# Patient Record
Sex: Female | Born: 1950
Health system: Southern US, Community
[De-identification: ages and names within clinical notes are randomized; demographics above are authoritative.]

## PROBLEM LIST (undated history)

## (undated) DIAGNOSIS — F429 Obsessive-compulsive disorder, unspecified: Secondary | ICD-10-CM

## (undated) DIAGNOSIS — E785 Hyperlipidemia, unspecified: Secondary | ICD-10-CM

## (undated) DIAGNOSIS — E538 Deficiency of other specified B group vitamins: Secondary | ICD-10-CM

## (undated) DIAGNOSIS — K219 Gastro-esophageal reflux disease without esophagitis: Secondary | ICD-10-CM

## (undated) DIAGNOSIS — N189 Chronic kidney disease, unspecified: Secondary | ICD-10-CM

## (undated) DIAGNOSIS — I10 Essential (primary) hypertension: Secondary | ICD-10-CM

## (undated) DIAGNOSIS — N3010 Interstitial cystitis (chronic) without hematuria: Secondary | ICD-10-CM

## (undated) DIAGNOSIS — F341 Dysthymic disorder: Secondary | ICD-10-CM

## (undated) DIAGNOSIS — N841 Polyp of cervix uteri: Secondary | ICD-10-CM

## (undated) DIAGNOSIS — R7309 Other abnormal glucose: Secondary | ICD-10-CM

## (undated) HISTORY — DX: Hyperlipidemia, unspecified: E78.5

## (undated) HISTORY — DX: Other abnormal glucose: R73.09

## (undated) HISTORY — DX: Deficiency of other specified B group vitamins: E53.8

## (undated) HISTORY — DX: Gastro-esophageal reflux disease without esophagitis: K21.9

## (undated) HISTORY — DX: Dysthymic disorder: F34.1

## (undated) HISTORY — DX: Chronic kidney disease, unspecified: N18.9

## (undated) HISTORY — PX: LASER ABLATION CONDYLOMA CERVICAL / VULVAR: SUR819

## (undated) HISTORY — DX: Essential (primary) hypertension: I10

## (undated) HISTORY — DX: Obsessive-compulsive disorder, unspecified: F42.9

## (undated) HISTORY — DX: Polyp of cervix uteri: N84.1

---

## 2012-07-03 DIAGNOSIS — R7303 Prediabetes: Secondary | ICD-10-CM | POA: Insufficient documentation

## 2012-07-03 DIAGNOSIS — E785 Hyperlipidemia, unspecified: Secondary | ICD-10-CM | POA: Insufficient documentation

## 2013-01-23 ENCOUNTER — Ambulatory Visit: Payer: Self-pay | Admitting: Family Medicine

## 2013-08-05 ENCOUNTER — Encounter: Payer: Self-pay | Admitting: *Deleted

## 2013-08-12 ENCOUNTER — Encounter: Payer: Self-pay | Admitting: Cardiovascular Disease

## 2013-08-12 ENCOUNTER — Ambulatory Visit (INDEPENDENT_AMBULATORY_CARE_PROVIDER_SITE_OTHER): Payer: Medicare Other | Admitting: Cardiovascular Disease

## 2013-08-12 ENCOUNTER — Encounter (INDEPENDENT_AMBULATORY_CARE_PROVIDER_SITE_OTHER): Payer: Self-pay

## 2013-08-12 VITALS — BP 151/84 | HR 63 | Ht 66.5 in | Wt 227.5 lb

## 2013-08-12 DIAGNOSIS — R0989 Other specified symptoms and signs involving the circulatory and respiratory systems: Secondary | ICD-10-CM

## 2013-08-12 DIAGNOSIS — I10 Essential (primary) hypertension: Secondary | ICD-10-CM | POA: Insufficient documentation

## 2013-08-12 DIAGNOSIS — R0609 Other forms of dyspnea: Secondary | ICD-10-CM

## 2013-08-12 DIAGNOSIS — R079 Chest pain, unspecified: Secondary | ICD-10-CM

## 2013-08-12 DIAGNOSIS — R0600 Dyspnea, unspecified: Secondary | ICD-10-CM | POA: Insufficient documentation

## 2013-08-12 NOTE — Patient Instructions (Addendum)
Your physician has requested that you have an echocardiogram. Echocardiography is a painless test that uses sound waves to create images of your heart. It provides your doctor with information about the size and shape of your heart and how well your heart's chambers and valves are working. This procedure takes approximately one hour. There are no restrictions for this procedure.  [place]  Your caregiver has ordered a Stress Test with nuclear imaging. The purpose of this test is to evaluate the blood supply to your heart muscle. This procedure is referred to as a "Non-Invasive Stress Test." This is because other than having an IV started in your vein, nothing is inserted or "invades" your body. Cardiac stress tests are done to find areas of poor blood flow to the heart by determining the extent of coronary artery disease (CAD). Some patients exercise on a treadmill, which naturally increases the blood flow to your heart, while others who are  unable to walk on a treadmill due to physical limitations have a pharmacologic/chemical stress agent called Lexiscan . This medicine will mimic walking on a treadmill by temporarily increasing your coronary blood flow.   Please note: these test may take anywhere between 2-4 hours to complete  PLEASE REPORT TO [facility] AT THE FIRST DESK WILL DIRECT YOU WHERE TO GO  Date of Procedure:_____________5/5/15________________________  Arrival Time for Procedure:__________0945 AM____________________   PLEASE NOTIFY THE OFFICE AT LEAST 24 HOURS IN ADVANCE IF YOU ARE UNABLE TO KEEP YOUR APPOINTMENT.  [phone] AND  PLEASE NOTIFY NUCLEAR MEDICINE AT [facility] AT LEAST 24 HOURS IN ADVANCE IF YOU ARE UNABLE TO KEEP YOUR APPOINTMENT. [phone]  How to prepare for your Myoview test:  1. Do not eat or drink after midnight 2. No caffeine for 24 hours prior to test 3. No smoking 24 hours prior to test. 4. Your medication may be taken  with water.  If your doctor stopped a medication because of this test, do not take that medication. 5. Ladies, please do not wear dresses.  Skirts or pants are appropriate. Please wear a short sleeve shirt. 6. No perfume, cologne or lotion. 7. Wear comfortable walking shoes. No heels!  Your physician recommends that you schedule a follow-up appointment in:  After your tests

## 2013-08-12 NOTE — Progress Notes (Signed)
Primary care physician: Dr. Margret Chance  HPI  This is a 63 year old Caucasian female who was referred for evaluation of chest pain and dyspnea. She has no previous cardiac history. She has chronic medical conditions that include hypertension, obesity, hyperlipidemia and borderline diabetes. She suffers from severe anxiety and depression. She reports chest pain that started about one year ago described as right-sided and substernal aching sensation worse with certain movements. She had a mammogram and chest x-ray done which were unremarkable. She was diagnosed with musculoskeletal pain. Symptoms partially improved with medications. However, over the last few weeks he has experienced excessive fatigue and dyspnea with minimal activities. No orthopnea or PND. She does not exercise on a regular basis. She is a lifelong nonsmoker and denies alcohol use. There is family history of coronary artery disease but not prematurely.  Not on File   Current Outpatient Prescriptions on File Prior to Visit  Medication Sig Dispense Refill  . ALPRAZolam (XANAX PO) Take 1 mg by mouth as needed.       . bisoprolol-hydrochlorothiazide (ZIAC) 10-6.25 MG per tablet Take 1 tablet by mouth daily.      . clorazepate (TRANXENE) 7.5 MG tablet Take 7.5 mg by mouth at bedtime.       Marland Kitchen NABUMETONE PO Take 750 mg by mouth 2 (two) times daily.      . Omega-3 Fatty Acids (FISH OIL PO) Take 2 g by mouth daily.      . ranitidine (ZANTAC) 150 MG capsule Take 150 mg by mouth 2 (two) times daily.      . silver sulfADIAZINE (SILVADENE) 1 % cream Apply 1 application topically as needed.       . venlafaxine (EFFEXOR) 75 MG tablet Take 75 mg by mouth 2 (two) times daily.       No current facility-administered medications on file prior to visit.     Past Medical History  Diagnosis Date  . Dysthymic disorder   . Obsessive-compulsive disorders   . Esophageal reflux   . Other B-complex deficiencies   . Other abnormal glucose     . Hyperlipidemia   . Other abnormal glucose   . Cervical polyp   . Hypertension      Past Surgical History  Procedure Laterality Date  . Laser ablation condyloma cervical / vulvar       Family History  Problem Relation Age of Onset  . Heart failure Mother   . Aortic stenosis Mother   . Hypertension Mother   . Heart attack Father   . Hypertension Father   . Heart attack Paternal Grandfather      History   Social History  . Marital Status: Single    Spouse Name: N/A    Number of Children: N/A  . Years of Education: N/A   Occupational History  . Not on file.   Social History Main Topics  . Smoking status: Never Smoker   . Smokeless tobacco: Not on file  . Alcohol Use: No  . Drug Use: No  . Sexual Activity: Not on file   Other Topics Concern  . Not on file   Social History Narrative  . No narrative on file     ROS A 10 point review of system was performed. It is negative other than that mentioned in the history of present illness.   PHYSICAL EXAM   BP 151/84  Pulse 63  Ht 5' 6.5" (1.689 m)  Wt 227 lb 8 oz (103.193 kg)  BMI 36.17  kg/m2 Constitutional: She is oriented to person, place, and time. She appears well-developed and well-nourished. No distress.  HENT: No nasal discharge.  Head: Normocephalic and atraumatic.  Eyes: Pupils are equal and round. No discharge.  Neck: Normal range of motion. Neck supple. No JVD present. No thyromegaly present.  Cardiovascular: Normal rate, regular rhythm, normal heart sounds. Exam reveals no gallop and no friction rub. No murmur heard.  Pulmonary/Chest: Effort normal and breath sounds normal. No stridor. No respiratory distress. She has no wheezes. She has no rales. She exhibits no tenderness.  Abdominal: Soft. Bowel sounds are normal. She exhibits no distension. There is no tenderness. There is no rebound and no guarding.  Musculoskeletal: Normal range of motion. She exhibits no edema and no tenderness.   Neurological: She is alert and oriented to person, place, and time. Coordination normal.  Skin: Skin is warm and dry. No rash noted. She is not diaphoretic. No erythema. No pallor.  Psychiatric: She has a normal mood and affect. Her behavior is normal. Judgment and thought content normal.     EKG: Normal sinus rhythm with no significant ST or changes.   ASSESSMENT AND PLAN

## 2013-08-12 NOTE — Assessment & Plan Note (Signed)
Blood pressure is elevated today. Continue to monitor. I will have her followup with me after cardiac testing.

## 2013-08-12 NOTE — Assessment & Plan Note (Signed)
This is likely multifactorial with a component of physical deconditioning. She does have known history of hypertension. I requested an echocardiogram to evaluate LV systolic/diastolic function and pulmonary pressure.

## 2013-08-12 NOTE — Assessment & Plan Note (Signed)
I agree that the chest pain is overall atypical. However, this has persisted and recently has been associated with dyspnea with minimal activities. Given her risk factors for coronary artery disease, I recommend evaluation with a pharmacologic nuclear stress test. She is not able to exercise on a treadmill.

## 2013-08-18 ENCOUNTER — Telehealth: Payer: Self-pay

## 2013-08-18 DIAGNOSIS — R079 Chest pain, unspecified: Secondary | ICD-10-CM

## 2013-08-18 NOTE — Telephone Encounter (Signed)
That's fine

## 2013-08-18 NOTE — Telephone Encounter (Signed)
Pt would like to try treadmill myo instead of lexi.  She stated she feels her anxiety is to bad for the Lexi.

## 2013-08-18 NOTE — Telephone Encounter (Signed)
Informed patient that we switched her to a Treadmill Myoview  Patient verbalized understanding   Tammy at [facility] confirmed change

## 2013-08-18 NOTE — Telephone Encounter (Signed)
Pt called and has a question about her stress test

## 2013-08-19 ENCOUNTER — Ambulatory Visit: Payer: Self-pay | Admitting: Cardiovascular Disease

## 2013-08-19 DIAGNOSIS — R079 Chest pain, unspecified: Secondary | ICD-10-CM

## 2013-08-20 ENCOUNTER — Other Ambulatory Visit: Payer: Self-pay

## 2013-08-20 DIAGNOSIS — R079 Chest pain, unspecified: Secondary | ICD-10-CM

## 2013-08-21 ENCOUNTER — Other Ambulatory Visit: Payer: Medicare Other

## 2013-08-21 ENCOUNTER — Other Ambulatory Visit (INDEPENDENT_AMBULATORY_CARE_PROVIDER_SITE_OTHER): Payer: Medicare Other

## 2013-08-21 ENCOUNTER — Other Ambulatory Visit: Payer: Self-pay

## 2013-08-21 DIAGNOSIS — R0602 Shortness of breath: Secondary | ICD-10-CM

## 2013-08-21 DIAGNOSIS — R079 Chest pain, unspecified: Secondary | ICD-10-CM

## 2013-08-21 DIAGNOSIS — R0600 Dyspnea, unspecified: Secondary | ICD-10-CM

## 2013-08-26 ENCOUNTER — Encounter: Payer: Self-pay | Admitting: Cardiovascular Disease

## 2013-08-26 ENCOUNTER — Ambulatory Visit (INDEPENDENT_AMBULATORY_CARE_PROVIDER_SITE_OTHER): Payer: Medicare Other | Admitting: Cardiovascular Disease

## 2013-08-26 VITALS — BP 141/84 | HR 61 | Ht 66.0 in | Wt 229.8 lb

## 2013-08-26 DIAGNOSIS — R0600 Dyspnea, unspecified: Secondary | ICD-10-CM

## 2013-08-26 DIAGNOSIS — R0609 Other forms of dyspnea: Secondary | ICD-10-CM

## 2013-08-26 DIAGNOSIS — R079 Chest pain, unspecified: Secondary | ICD-10-CM

## 2013-08-26 DIAGNOSIS — R0989 Other specified symptoms and signs involving the circulatory and respiratory systems: Secondary | ICD-10-CM

## 2013-08-26 NOTE — Progress Notes (Signed)
Primary care physician: Dr. Margret Chance  HPI  This is a 63 year old Caucasian female who is here today for followup visit regarding chest pain and dyspnea. She has no previous cardiac history. She has chronic medical conditions that include hypertension, obesity, hyperlipidemia and borderline diabetes. She suffers from severe anxiety and depression. She was seen recently for atypical chest pain and exertional dyspnea. She underwent a pharmacologic nuclear stress test which showed no evidence of ischemia. Echocardiogram showed normal LV systolic function with only mild mitral regurgitation and no evidence of pulmonary hypertension.   No Known Allergies   Current Outpatient Prescriptions on File Prior to Visit  Medication Sig Dispense Refill  . ALPRAZolam (XANAX PO) Take 1 mg by mouth as needed.       Marland Kitchen b complex vitamins tablet Take 1 tablet by mouth daily.      . bisoprolol-hydrochlorothiazide (ZIAC) 10-6.25 MG per tablet Take 1 tablet by mouth daily.      . Cholecalciferol (VITAMIN D3) 2000 UNITS capsule Take 2,000 Units by mouth daily.      . clorazepate (TRANXENE) 7.5 MG tablet Take 7.5 mg by mouth at bedtime.       Marland Kitchen NABUMETONE PO Take 750 mg by mouth 2 (two) times daily.      . ranitidine (ZANTAC) 150 MG capsule Take 150 mg by mouth 2 (two) times daily.      . silver sulfADIAZINE (SILVADENE) 1 % cream Apply 1 application topically as needed.       . venlafaxine (EFFEXOR) 75 MG tablet Take 75 mg by mouth 2 (two) times daily.       No current facility-administered medications on file prior to visit.     Past Medical History  Diagnosis Date  . Dysthymic disorder   . Obsessive-compulsive disorders   . Esophageal reflux   . Other B-complex deficiencies   . Other abnormal glucose   . Hyperlipidemia   . Other abnormal glucose   . Cervical polyp   . Hypertension      Past Surgical History  Procedure Laterality Date  . Laser ablation condyloma cervical / vulvar        Family History  Problem Relation Age of Onset  . Heart failure Mother   . Aortic stenosis Mother   . Hypertension Mother   . Heart attack Father   . Hypertension Father   . Heart attack Paternal Grandfather      History   Social History  . Marital Status: Single    Spouse Name: N/A    Number of Children: N/A  . Years of Education: N/A   Occupational History  . Not on file.   Social History Main Topics  . Smoking status: Never Smoker   . Smokeless tobacco: Not on file  . Alcohol Use: No  . Drug Use: No  . Sexual Activity: Not on file   Other Topics Concern  . Not on file   Social History Narrative  . No narrative on file       PHYSICAL EXAM   BP 141/84  Pulse 61  Ht 5\' 6"  (1.676 m)  Wt 229 lb 12 oz (104.214 kg)  BMI 37.10 kg/m2 Constitutional: She is oriented to person, place, and time. She appears well-developed and well-nourished. No distress.  HENT: No nasal discharge.  Head: Normocephalic and atraumatic.  Eyes: Pupils are equal and round. No discharge.  Neck: Normal range of motion. Neck supple. No JVD present. No thyromegaly present.  Cardiovascular: Normal  rate, regular rhythm, normal heart sounds. Exam reveals no gallop and no friction rub. No murmur heard.  Pulmonary/Chest: Effort normal and breath sounds normal. No stridor. No respiratory distress. She has no wheezes. She has no rales. She exhibits no tenderness.  Abdominal: Soft. Bowel sounds are normal. She exhibits no distension. There is no tenderness. There is no rebound and no guarding.  Musculoskeletal: Normal range of motion. She exhibits no edema and no tenderness.  Neurological: She is alert and oriented to person, place, and time. Coordination normal.  Skin: Skin is warm and dry. No rash noted. She is not diaphoretic. No erythema. No pallor.  Psychiatric: She has a normal mood and affect. Her behavior is normal. Judgment and thought content normal.       ASSESSMENT AND  PLAN

## 2013-08-26 NOTE — Patient Instructions (Signed)
Your stress test and echo were fine.   Follow up as needed.

## 2013-08-26 NOTE — Assessment & Plan Note (Signed)
Echocardiogram was unremarkable. I suspect that that is likely an element of physical deconditioning. I advised him to start an exercise program. She can followup with [place] as needed.

## 2013-08-26 NOTE — Assessment & Plan Note (Signed)
She reports no further episodes of chest pain. Stress test was normal. No further cardiac workup is recommended. Many of her symptoms seem to be related to uncontrolled anxiety. She also suffers from significant depression.

## 2015-04-23 LAB — HM COLONOSCOPY

## 2015-07-14 DIAGNOSIS — G4733 Obstructive sleep apnea (adult) (pediatric): Secondary | ICD-10-CM | POA: Insufficient documentation

## 2016-08-02 ENCOUNTER — Other Ambulatory Visit: Payer: Self-pay | Admitting: Hematology and Oncology

## 2016-08-02 DIAGNOSIS — Z1239 Encounter for other screening for malignant neoplasm of breast: Secondary | ICD-10-CM

## 2016-08-23 ENCOUNTER — Other Ambulatory Visit: Payer: Self-pay | Admitting: Hematology and Oncology

## 2016-08-23 DIAGNOSIS — M79605 Pain in left leg: Secondary | ICD-10-CM

## 2016-08-24 ENCOUNTER — Ambulatory Visit: Payer: Medicare Other

## 2016-08-25 ENCOUNTER — Ambulatory Visit
Admission: RE | Admit: 2016-08-25 | Discharge: 2016-08-25 | Disposition: A | Discharge status: Home / Self Care | Payer: Medicare Other | Source: Ambulatory Visit | Attending: Hematology and Oncology | Admitting: Hematology and Oncology

## 2016-08-25 DIAGNOSIS — M79605 Pain in left leg: Secondary | ICD-10-CM | POA: Diagnosis present

## 2016-10-03 ENCOUNTER — Other Ambulatory Visit: Payer: Self-pay | Admitting: Family Medicine

## 2016-10-03 DIAGNOSIS — M79662 Pain in left lower leg: Secondary | ICD-10-CM

## 2017-04-23 DIAGNOSIS — Z23 Encounter for immunization: Secondary | ICD-10-CM | POA: Diagnosis not present

## 2017-04-23 DIAGNOSIS — Z01419 Encounter for gynecological examination (general) (routine) without abnormal findings: Secondary | ICD-10-CM | POA: Diagnosis not present

## 2017-04-23 DIAGNOSIS — R002 Palpitations: Secondary | ICD-10-CM | POA: Diagnosis not present

## 2017-04-23 DIAGNOSIS — I10 Essential (primary) hypertension: Secondary | ICD-10-CM | POA: Diagnosis not present

## 2017-04-23 DIAGNOSIS — Z1231 Encounter for screening mammogram for malignant neoplasm of breast: Secondary | ICD-10-CM | POA: Diagnosis not present

## 2017-04-23 DIAGNOSIS — R69 Illness, unspecified: Secondary | ICD-10-CM | POA: Diagnosis not present

## 2017-04-23 DIAGNOSIS — R7303 Prediabetes: Secondary | ICD-10-CM | POA: Diagnosis not present

## 2017-04-23 DIAGNOSIS — N3010 Interstitial cystitis (chronic) without hematuria: Secondary | ICD-10-CM | POA: Diagnosis not present

## 2017-04-23 DIAGNOSIS — E785 Hyperlipidemia, unspecified: Secondary | ICD-10-CM | POA: Diagnosis not present

## 2017-04-23 DIAGNOSIS — Z780 Asymptomatic menopausal state: Secondary | ICD-10-CM | POA: Diagnosis not present

## 2017-04-24 DIAGNOSIS — G8929 Other chronic pain: Secondary | ICD-10-CM | POA: Diagnosis not present

## 2017-04-24 DIAGNOSIS — M6281 Muscle weakness (generalized): Secondary | ICD-10-CM | POA: Diagnosis not present

## 2017-04-24 DIAGNOSIS — M545 Low back pain: Secondary | ICD-10-CM | POA: Diagnosis not present

## 2017-04-24 DIAGNOSIS — M25562 Pain in left knee: Secondary | ICD-10-CM | POA: Diagnosis not present

## 2017-04-26 DIAGNOSIS — G8929 Other chronic pain: Secondary | ICD-10-CM | POA: Diagnosis not present

## 2017-04-26 DIAGNOSIS — M25562 Pain in left knee: Secondary | ICD-10-CM | POA: Diagnosis not present

## 2017-04-26 DIAGNOSIS — M545 Low back pain: Secondary | ICD-10-CM | POA: Diagnosis not present

## 2017-04-26 DIAGNOSIS — M6281 Muscle weakness (generalized): Secondary | ICD-10-CM | POA: Diagnosis not present

## 2017-05-22 DIAGNOSIS — R69 Illness, unspecified: Secondary | ICD-10-CM | POA: Diagnosis not present

## 2017-05-22 DIAGNOSIS — N3010 Interstitial cystitis (chronic) without hematuria: Secondary | ICD-10-CM | POA: Diagnosis not present

## 2017-05-22 DIAGNOSIS — Z79899 Other long term (current) drug therapy: Secondary | ICD-10-CM | POA: Diagnosis not present

## 2017-05-22 DIAGNOSIS — E785 Hyperlipidemia, unspecified: Secondary | ICD-10-CM | POA: Diagnosis not present

## 2017-05-22 DIAGNOSIS — Z888 Allergy status to other drugs, medicaments and biological substances status: Secondary | ICD-10-CM | POA: Diagnosis not present

## 2017-05-22 DIAGNOSIS — I10 Essential (primary) hypertension: Secondary | ICD-10-CM | POA: Diagnosis not present

## 2017-06-04 DIAGNOSIS — R69 Illness, unspecified: Secondary | ICD-10-CM | POA: Diagnosis not present

## 2017-06-04 DIAGNOSIS — L981 Factitial dermatitis: Secondary | ICD-10-CM | POA: Diagnosis not present

## 2017-06-04 DIAGNOSIS — F410 Panic disorder [episodic paroxysmal anxiety] without agoraphobia: Secondary | ICD-10-CM | POA: Diagnosis not present

## 2017-06-04 DIAGNOSIS — F3181 Bipolar II disorder: Secondary | ICD-10-CM | POA: Diagnosis not present

## 2017-09-26 DIAGNOSIS — M9903 Segmental and somatic dysfunction of lumbar region: Secondary | ICD-10-CM | POA: Diagnosis not present

## 2017-09-26 DIAGNOSIS — M25552 Pain in left hip: Secondary | ICD-10-CM | POA: Diagnosis not present

## 2017-09-26 DIAGNOSIS — M25562 Pain in left knee: Secondary | ICD-10-CM | POA: Diagnosis not present

## 2017-09-26 DIAGNOSIS — M5442 Lumbago with sciatica, left side: Secondary | ICD-10-CM | POA: Diagnosis not present

## 2017-10-08 DIAGNOSIS — M25562 Pain in left knee: Secondary | ICD-10-CM | POA: Diagnosis not present

## 2017-10-08 DIAGNOSIS — M25552 Pain in left hip: Secondary | ICD-10-CM | POA: Diagnosis not present

## 2017-10-08 DIAGNOSIS — M9903 Segmental and somatic dysfunction of lumbar region: Secondary | ICD-10-CM | POA: Diagnosis not present

## 2017-10-08 DIAGNOSIS — M5442 Lumbago with sciatica, left side: Secondary | ICD-10-CM | POA: Diagnosis not present

## 2017-10-24 DIAGNOSIS — M9903 Segmental and somatic dysfunction of lumbar region: Secondary | ICD-10-CM | POA: Diagnosis not present

## 2017-10-24 DIAGNOSIS — M5442 Lumbago with sciatica, left side: Secondary | ICD-10-CM | POA: Diagnosis not present

## 2017-10-24 DIAGNOSIS — M25552 Pain in left hip: Secondary | ICD-10-CM | POA: Diagnosis not present

## 2017-10-24 DIAGNOSIS — M25562 Pain in left knee: Secondary | ICD-10-CM | POA: Diagnosis not present

## 2017-11-07 DIAGNOSIS — M25552 Pain in left hip: Secondary | ICD-10-CM | POA: Diagnosis not present

## 2017-11-07 DIAGNOSIS — M9903 Segmental and somatic dysfunction of lumbar region: Secondary | ICD-10-CM | POA: Diagnosis not present

## 2017-11-07 DIAGNOSIS — M25562 Pain in left knee: Secondary | ICD-10-CM | POA: Diagnosis not present

## 2017-11-07 DIAGNOSIS — M5442 Lumbago with sciatica, left side: Secondary | ICD-10-CM | POA: Diagnosis not present

## 2017-11-12 DIAGNOSIS — R69 Illness, unspecified: Secondary | ICD-10-CM | POA: Diagnosis not present

## 2017-11-12 DIAGNOSIS — E785 Hyperlipidemia, unspecified: Secondary | ICD-10-CM | POA: Diagnosis not present

## 2017-11-12 DIAGNOSIS — G8929 Other chronic pain: Secondary | ICD-10-CM | POA: Diagnosis not present

## 2017-11-12 DIAGNOSIS — M25562 Pain in left knee: Secondary | ICD-10-CM | POA: Diagnosis not present

## 2017-11-12 DIAGNOSIS — M1712 Unilateral primary osteoarthritis, left knee: Secondary | ICD-10-CM | POA: Diagnosis not present

## 2017-11-12 DIAGNOSIS — R944 Abnormal results of kidney function studies: Secondary | ICD-10-CM | POA: Diagnosis not present

## 2017-11-12 DIAGNOSIS — R7303 Prediabetes: Secondary | ICD-10-CM | POA: Diagnosis not present

## 2017-11-12 DIAGNOSIS — N3010 Interstitial cystitis (chronic) without hematuria: Secondary | ICD-10-CM | POA: Diagnosis not present

## 2017-11-23 DIAGNOSIS — Z6841 Body Mass Index (BMI) 40.0 and over, adult: Secondary | ICD-10-CM | POA: Diagnosis not present

## 2017-11-23 DIAGNOSIS — G8929 Other chronic pain: Secondary | ICD-10-CM | POA: Diagnosis not present

## 2017-11-23 DIAGNOSIS — M25561 Pain in right knee: Secondary | ICD-10-CM | POA: Diagnosis not present

## 2018-02-01 DIAGNOSIS — R69 Illness, unspecified: Secondary | ICD-10-CM | POA: Diagnosis not present

## 2018-03-12 DIAGNOSIS — R69 Illness, unspecified: Secondary | ICD-10-CM | POA: Diagnosis not present

## 2018-03-12 DIAGNOSIS — L981 Factitial dermatitis: Secondary | ICD-10-CM | POA: Diagnosis not present

## 2018-03-12 DIAGNOSIS — F410 Panic disorder [episodic paroxysmal anxiety] without agoraphobia: Secondary | ICD-10-CM | POA: Diagnosis not present

## 2018-03-12 DIAGNOSIS — F3181 Bipolar II disorder: Secondary | ICD-10-CM | POA: Diagnosis not present

## 2018-04-15 ENCOUNTER — Telehealth: Payer: Self-pay | Admitting: Obstetrics and Gynecology

## 2018-04-15 ENCOUNTER — Telehealth: Payer: Self-pay

## 2018-04-15 ENCOUNTER — Ambulatory Visit (INDEPENDENT_AMBULATORY_CARE_PROVIDER_SITE_OTHER): Payer: Medicare HMO | Admitting: Family Medicine

## 2018-04-15 ENCOUNTER — Encounter: Payer: Self-pay | Admitting: Family Medicine

## 2018-04-15 VITALS — BP 130/82 | HR 62 | Temp 98.0°F | Ht 64.5 in | Wt 226.1 lb

## 2018-04-15 DIAGNOSIS — E538 Deficiency of other specified B group vitamins: Secondary | ICD-10-CM | POA: Insufficient documentation

## 2018-04-15 DIAGNOSIS — E559 Vitamin D deficiency, unspecified: Secondary | ICD-10-CM | POA: Diagnosis not present

## 2018-04-15 DIAGNOSIS — E782 Mixed hyperlipidemia: Secondary | ICD-10-CM | POA: Diagnosis not present

## 2018-04-15 DIAGNOSIS — R7303 Prediabetes: Secondary | ICD-10-CM

## 2018-04-15 DIAGNOSIS — Z5181 Encounter for therapeutic drug level monitoring: Secondary | ICD-10-CM | POA: Diagnosis not present

## 2018-04-15 DIAGNOSIS — N3010 Interstitial cystitis (chronic) without hematuria: Secondary | ICD-10-CM

## 2018-04-15 DIAGNOSIS — E2839 Other primary ovarian failure: Secondary | ICD-10-CM | POA: Diagnosis not present

## 2018-04-15 DIAGNOSIS — Z1239 Encounter for other screening for malignant neoplasm of breast: Secondary | ICD-10-CM | POA: Diagnosis not present

## 2018-04-15 DIAGNOSIS — I10 Essential (primary) hypertension: Secondary | ICD-10-CM | POA: Diagnosis not present

## 2018-04-15 DIAGNOSIS — F429 Obsessive-compulsive disorder, unspecified: Secondary | ICD-10-CM | POA: Insufficient documentation

## 2018-04-15 DIAGNOSIS — K219 Gastro-esophageal reflux disease without esophagitis: Secondary | ICD-10-CM

## 2018-04-15 DIAGNOSIS — K5900 Constipation, unspecified: Secondary | ICD-10-CM | POA: Diagnosis not present

## 2018-04-15 DIAGNOSIS — R102 Pelvic and perineal pain: Secondary | ICD-10-CM

## 2018-04-15 DIAGNOSIS — M170 Bilateral primary osteoarthritis of knee: Secondary | ICD-10-CM | POA: Insufficient documentation

## 2018-04-15 DIAGNOSIS — F329 Major depressive disorder, single episode, unspecified: Secondary | ICD-10-CM | POA: Insufficient documentation

## 2018-04-15 DIAGNOSIS — E669 Obesity, unspecified: Secondary | ICD-10-CM | POA: Insufficient documentation

## 2018-04-15 DIAGNOSIS — F419 Anxiety disorder, unspecified: Secondary | ICD-10-CM

## 2018-04-15 DIAGNOSIS — G4733 Obstructive sleep apnea (adult) (pediatric): Secondary | ICD-10-CM

## 2018-04-15 DIAGNOSIS — IMO0001 Reserved for inherently not codable concepts without codable children: Secondary | ICD-10-CM | POA: Insufficient documentation

## 2018-04-15 NOTE — Assessment & Plan Note (Signed)
Check liver and kidneys 

## 2018-04-15 NOTE — Assessment & Plan Note (Signed)
Encouraged weight loss; see AVS for information, education given

## 2018-04-15 NOTE — Assessment & Plan Note (Signed)
Check vitamin D level and replace if needed

## 2018-04-15 NOTE — Assessment & Plan Note (Signed)
Not wearing CPAP; weight loss encouraged

## 2018-04-15 NOTE — Assessment & Plan Note (Signed)
Check B12 deficiency

## 2018-04-15 NOTE — Assessment & Plan Note (Signed)
Managed by orthopaedist; weight loss encouraged

## 2018-04-15 NOTE — Assessment & Plan Note (Signed)
BMI >35 plus HTN, hyperlipidemia, reflux, sleep apnea; see AVS for education given

## 2018-04-15 NOTE — Telephone Encounter (Signed)
Wants to see female gyn

## 2018-04-15 NOTE — Assessment & Plan Note (Signed)
Check glucose and A1c; weight loss important

## 2018-04-15 NOTE — Assessment & Plan Note (Signed)
Check lipids; limit saturated fats 

## 2018-04-15 NOTE — Patient Instructions (Addendum)
Try to follow the DASH guidelines (DASH stands for Dietary Approaches to Stop Hypertension). Try to limit the sodium in your diet to no more than 1,500mg  of sodium per day. Certainly try to not exceed 2,000 mg per day at the very most. Do not add salt when cooking or at the table.  Check the sodium amount on labels when shopping, and choose items lower in sodium when given a choice. Avoid or limit foods that already contain a lot of sodium. Eat a diet rich in fruits and vegetables and whole grains, and try to lose weight if overweight or obese  Check out the information at familydoctor.org entitled "Nutrition for Weight Loss: What You Need to Know about Fad Diets" Try to lose between 1-2 pounds per week by taking in fewer calories and burning off more calories You can succeed by limiting portions, limiting foods dense in calories and fat, becoming more active, and drinking 8 glasses of water a day (64 ounces) Don't skip meals, especially breakfast, as skipping meals may alter your metabolism Do not use over-the-counter weight loss pills or gimmicks that claim rapid weight loss A healthy BMI (or body mass index) is between 18.5 and 24.9 You can calculate your ideal BMI at the [place] website ClubMonetize.fr  Let's get labs today If you have not heard anything from my staff in a week about any orders/referrals/studies from today, please contact us here to follow-up [phone]   Obesity, Adult Obesity is the condition of having too much total body fat. Being overweight or obese means that your weight is greater than what is considered healthy for your body size. Obesity is determined by a measurement called BMI. BMI is an estimate of body fat and is calculated from height and weight. For adults, a BMI of 30 or higher is considered obese. Obesity can eventually lead to other health concerns and major illnesses, including:  Stroke.  Coronary artery  disease (CAD).  Type 2 diabetes.  Some types of cancer, including cancers of the colon, breast, uterus, and gallbladder.  Osteoarthritis.  High blood pressure (hypertension).  High cholesterol.  Sleep apnea.  Gallbladder stones.  Infertility problems. What are the causes? The main cause of obesity is taking in (consuming) more calories than your body uses for energy. Other factors that contribute to this condition may include:  Being born with genes that make you more likely to become obese.  Having a medical condition that causes obesity. These conditions include: ? Hypothyroidism. ? Polycystic ovarian syndrome (PCOS). ? Binge-eating disorder. ? Cushing syndrome.  Taking certain medicines, such as steroids, antidepressants, and seizure medicines.  Not being physically active (sedentary lifestyle).  Living where there are limited places to exercise safely or buy healthy foods.  Not getting enough sleep. What increases the risk? The following factors may increase your risk of this condition:  Having a family history of obesity.  Being a woman of African-American descent.  Being a man of Hispanic descent. What are the signs or symptoms? Having excessive body fat is the main symptom of this condition. How is this diagnosed? This condition may be diagnosed based on:  Your symptoms.  Your medical history.  A physical exam. Your health care provider may measure: ? Your BMI. If you are an adult with a BMI between 25 and less than 30, you are considered overweight. If you are an adult with a BMI of 30 or higher, you are considered obese. ? The distances around your hips and your  waist (circumferences). These may be compared to each other to help diagnose your condition. ? Your skinfold thickness. Your health care provider may gently pinch a fold of your skin and measure it. How is this treated? Treatment for this condition often includes changing your lifestyle.  Treatment may include some or all of the following:  Dietary changes. Work with your health care provider and a dietitian to set a weight-loss goal that is healthy and reasonable for you. Dietary changes may include eating: ? Smaller portions. A portion size is the amount of a particular food that is healthy for you to eat at one time. This varies from person to person. ? Low-calorie or low-fat options. ? More whole grains, fruits, and vegetables.  Regular physical activity. This may include aerobic activity (cardio) and strength training.  Medicine to help you lose weight. Your health care provider may prescribe medicine if you are unable to lose 1 pound a week after 6 weeks of eating more healthily and doing more physical activity.  Surgery. Surgical options may include gastric banding and gastric bypass. Surgery may be done if: ? Other treatments have not helped to improve your condition. ? You have a BMI of 40 or higher. ? You have life-threatening health problems related to obesity. Follow these instructions at home:  Eating and drinking   Follow recommendations from your health care provider about what you eat and drink. Your health care provider may advise you to: ? Limit fast foods, sweets, and processed snack foods. ? Choose low-fat options, such as low-fat milk instead of whole milk. ? Eat 5 or more servings of fruits or vegetables every day. ? Eat at home more often. This gives you more control over what you eat. ? Choose healthy foods when you eat out. ? Learn what a healthy portion size is. ? Keep low-fat snacks on hand. ? Avoid sugary drinks, such as soda, fruit juice, iced tea sweetened with sugar, and flavored milk. ? Eat a healthy breakfast.  Drink enough water to keep your urine clear or pale yellow.  Do not go without eating for long periods of time (do not fast) or follow a fad diet. Fasting and fad diets can be unhealthy and even dangerous. Physical  Activity  Exercise regularly, as told by your health care provider. Ask your health care provider what types of exercise are safe for you and how often you should exercise.  Warm up and stretch before being active.  Cool down and stretch after being active.  Rest between periods of activity. Lifestyle  Limit the time that you spend in front of your TV, computer, or video game system.  Find ways to reward yourself that do not involve food.  Limit alcohol intake to no more than 1 drink a day for nonpregnant women and 2 drinks a day for men. One drink equals 12 oz of beer, 5 oz of wine, or 1 oz of hard liquor. General instructions  Keep a weight loss journal to keep track of the food you eat and how much you exercise you get.  Take over-the-counter and prescription medicines only as told by your health care provider.  Take vitamins and supplements only as told by your health care provider.  Consider joining a support group. Your health care provider may be able to recommend a support group.  Keep all follow-up visits as told by your health care provider. This is important. Contact a health care provider if:  You are unable to  meet your weight loss goal after 6 weeks of dietary and lifestyle changes. This information is not intended to replace advice given to you by your health care provider. Make sure you discuss any questions you have with your health care provider. Document Released: 05/11/2004 Document Revised: 09/06/2015 Document Reviewed: 01/20/2015 Elsevier Interactive Patient Education  2019 Elsevier Inc.  Preventing Unhealthy [employer], Adult Staying at a healthy weight is important to your overall health. When fat builds up in your body, you may become overweight or obese. Being overweight or obese increases your risk of developing certain health problems, such as heart disease, diabetes, sleeping problems, joint problems, and some types of cancer. Unhealthy weight gain  is often the result of making unhealthy food choices or not getting enough exercise. You can make changes to your lifestyle to prevent obesity and stay as healthy as possible. What nutrition changes can be made?   Eat only as much as your body needs. To do this: ? Pay attention to signs that you are hungry or full. Stop eating as soon as you feel full. ? If you feel hungry, try drinking water first before eating. Drink enough water so your urine is clear or pale yellow. ? Eat smaller portions. Pay attention to portion sizes when eating out. ? Look at serving sizes on food labels. Most foods contain more than one serving per container. ? Eat the recommended number of calories for your gender and activity level. For most active people, a daily total of 2,000 calories is appropriate. If you are trying to lose weight or are not very active, you may need to eat fewer calories. Talk with your health care provider or a diet and nutrition specialist (dietitian) about how many calories you need each day.  Choose healthy foods, such as: ? Fruits and vegetables. At each meal, try to fill at least half of your plate with fruits and vegetables. ? Whole grains, such as whole-wheat bread, brown rice, and quinoa. ? Lean meats, such as chicken or fish. ? Other healthy proteins, such as beans, eggs, or tofu. ? Healthy fats, such as nuts, seeds, fatty fish, and olive oil. ? Low-fat or fat-free dairy products.  Check food labels, and avoid food and drinks that: ? Are high in calories. ? Have added sugar. ? Are high in sodium. ? Have saturated fats or trans fats.  Cook foods in healthier ways, such as by baking, broiling, or grilling.  Make a meal plan for the week, and shop with a grocery list to help you stay on track with your purchases. Try to avoid going to the grocery store when you are hungry.  When grocery shopping, try to shop around the outside of the store first, where the fresh foods are. Doing  this helps you to avoid prepackaged foods, which can be high in sugar, salt (sodium), and fat. What lifestyle changes can be made?   Exercise for 30 or more minutes on 5 or more days each week. Exercising may include brisk walking, yard work, biking, running, swimming, and team sports like basketball and soccer. Ask your health care provider which exercises are safe for you.  Do muscle-strengthening activities, such as lifting weights or using resistance bands, on 2 or more days a week.  Do not use any products that contain nicotine or tobacco, such as cigarettes and e-cigarettes. If you need help quitting, ask your health care provider.  Limit alcohol intake to no more than 1 drink a day for  nonpregnant women and 2 drinks a day for men. One drink equals 12 oz of beer, 5 oz of wine, or 1 oz of hard liquor.  Try to get 7-9 hours of sleep each night. What other changes can be made?  Keep a food and activity journal to keep track of: ? What you ate and how many calories you had. Remember to count the calories in sauces, dressings, and side dishes. ? Whether you were active, and what exercises you did. ? Your calorie, weight, and activity goals.  Check your weight regularly. Track any changes. If you notice you have gained weight, make changes to your diet or activity routine.  Avoid taking weight-loss medicines or supplements. Talk to your health care provider before starting any new medicine or supplement.  Talk to your health care provider before trying any new diet or exercise plan. Why are these changes important? Eating healthy, staying active, and having healthy habits can help you to prevent obesity. Those changes also:  Help you manage stress and emotions.  Help you connect with friends and family.  Improve your self-esteem.  Improve your sleep.  Prevent long-term health problems. What can happen if changes are not made? Being obese or overweight can cause you to develop  joint or bone problems, which can make it hard for you to stay active or do activities you enjoy. Being obese or overweight also puts stress on your heart and lungs and can lead to health problems like diabetes, heart disease, and some cancers. Where to find more information Talk with your health care provider or a dietitian about healthy eating and healthy lifestyle choices. You may also find information from:  U.S. Department of Agriculture, MyPlate: FormerBoss.no  American Heart Association: www.heart.org  Centers for Disease Control and Prevention: http://www.wolf.info/ Summary  Staying at a healthy weight is important to your overall health. It helps you to prevent certain diseases and health problems, such as heart disease, diabetes, joint problems, sleep disorders, and some types of cancer.  Being obese or overweight can cause you to develop joint or bone problems, which can make it hard for you to stay active or do activities you enjoy.  You can prevent unhealthy weight gain by eating a healthy diet, exercising regularly, not smoking, limiting alcohol, and getting enough sleep.  Talk with your health care provider or a dietitian for guidance about healthy eating and healthy lifestyle choices. This information is not intended to replace advice given to you by your health care provider. Make sure you discuss any questions you have with your health care provider. Document Released: 04/04/2016 Document Revised: 01/12/2017 Document Reviewed: 05/10/2016 Elsevier Interactive Patient Education  2019 [employer].

## 2018-04-15 NOTE — Progress Notes (Signed)
BP 130/82   Pulse 62   Temp 98 F (36.7 C) (Oral)   Ht 5' 4.5" (1.638 m)   Wt 226 lb 1.6 oz (102.6 kg)   SpO2 96%   BMI 38.21 kg/m    Subjective:    Patient ID: Katherine Gould, female    DOB: 1951/03/02, 67 y.o.   MRN: [id number]  HPI: Katherine Gould is a 67 y.o. female  Chief Complaint  Patient presents with  . New Patient (Initial Visit)    HPI   Patient is here to establish care  She has HTN; on medicine, bisoprolol-hctz  Prediabetes; not sure how close she is to diabetes; dry mouth maybe from medicines; sister had diabetes (the sister who passed away); she had it then lost a lot of weight and they took the diabetes diagnosis No results found for: HGBA1C  Hyperlipidemia; she does not want to take a statin; sister took a statin, it weakened her muscles; she does not eat bologna; her tastes have changed in the last few years; nothing really looks good; eating more fast food in the last few years  Reflux; only on famotidiine after the zantac recall; no blood in the stool; never had a problem with her bowels until a month or two ago; some constipation; then started [employer] probiotic and it got better  Sleep apnea; not wearing the machine; she went to [facility]; trouble sleeping  Depression and anxiety and OCD; going on for years, seeing Dr. Kasandra Knudsen, has not seen him since February until a month ago and got her effexor; she sees a therapist, seeing Marjie Skiff for years; has two therapy dogs; she would not act on any thoughts of hurting herself or others; lots of depression in the family; sister died in 11-05-22; nothing out of the ordinary today, no need to call psychiatrist or therapist she says; hospitalized 7 years ago in [place]; was on Seroquel and something else, she did not have a good mix of medicine  Seeing Dr. Marry Guan for her left knee; injured it somehow; can't remember; saw him 3 years ago; he xrayed it; sent her to PT; cartilage was good; she went to PT off  and on; "no point in getting into my depression"; seeing Dr. Kasandra Knudsen; bilateral OA in knees; lower back issues too, but that got better, hx of sciatica; helped by chiropractor  She has had groin pain for 7 years; told her gynecologist; precancer cells in 1984 and had laser at [facility]; she would like to see new GYN; she has IC too; saw specialist, had bladder scans; then had heart tests about 7 years ago; history started to jump around, at this point, fam hx of HTN, went through stress testing and all the heart tests; had a leak and it was normal   Depression screen [facility] 2/9 04/15/2018  Decreased Interest 3  Down, Depressed, Hopeless 3  PHQ - 2 Score 6  Altered sleeping 3  Tired, decreased energy 3  Change in appetite 2  Feeling bad or failure about yourself  3  Trouble concentrating 3  Moving slowly or fidgety/restless 3  Suicidal thoughts 1  PHQ-9 Score 24  Difficult doing work/chores Extremely dIfficult   Fall Risk  04/15/2018  Falls in the past year? 1  Number falls in past yr: 0  Injury with Fall? 0    Relevant past medical, surgical, family and social history reviewed Past Medical History:  Diagnosis Date  . Cervical polyp   .  Dysthymic disorder   . Esophageal reflux   . Hyperlipidemia   . Hypertension   . Obsessive-compulsive disorders   . Other abnormal glucose   . Other abnormal glucose   . Other B-complex deficiencies    Past Surgical History:  Procedure Laterality Date  . LASER ABLATION CONDYLOMA CERVICAL / VULVAR     Family History  Problem Relation Age of Onset  . Heart failure Mother   . Aortic stenosis Mother   . Hypertension Mother   . Stroke Mother   . Glaucoma Mother   . Heart attack Father   . Hypertension Father   . Heart attack Paternal Grandfather   . Glaucoma Sister   . Hypertension Sister   . Atrial fibrillation Sister   . Heart failure Sister   . Anxiety disorder Sister    Social History   Tobacco Use  . Smoking status: Never Smoker  .  Smokeless tobacco: Never Used  Substance Use Topics  . Alcohol use: No  . Drug use: No     Office Visit from 04/15/2018 in [facility]  AUDIT-C Score  0      Interim medical history since last visit reviewed. Allergies and medications reviewed  Review of Systems Per HPI unless specifically indicated above     Objective:    BP 130/82   Pulse 62   Temp 98 F (36.7 C) (Oral)   Ht 5' 4.5" (1.638 m)   Wt 226 lb 1.6 oz (102.6 kg)   SpO2 96%   BMI 38.21 kg/m   Wt Readings from Last 3 Encounters:  04/15/18 226 lb 1.6 oz (102.6 kg)  08/26/13 229 lb 12 oz (104.2 kg)  08/12/13 227 lb 8 oz (103.2 kg)    Physical Exam Constitutional:      General: She is not in acute distress.    Appearance: She is well-developed. She is obese. She is not diaphoretic.  HENT:     Head: Normocephalic and atraumatic.  Eyes:     General: No scleral icterus. Neck:     Thyroid: No thyromegaly.  Cardiovascular:     Rate and Rhythm: Normal rate and regular rhythm.     Heart sounds: Normal heart sounds. No murmur.  Pulmonary:     Effort: Pulmonary effort is normal. No respiratory distress.     Breath sounds: Normal breath sounds. No wheezing.  Abdominal:     General: Bowel sounds are normal. There is no distension.     Palpations: Abdomen is soft.  Skin:    General: Skin is warm and dry.     Coloration: Skin is not pale.     Comments: Several scabs and scars on the legs (OCD, picking behavior cited)  Neurological:     Mental Status: She is alert.  Psychiatric:        Mood and Affect: Mood is not anxious. Affect is flat. Affect is not tearful or inappropriate.        Behavior: Behavior normal.        Thought Content: Thought content normal.        Judgment: Judgment normal.     Comments: Good eye contact with examiner     No results found for this or any previous visit.    Assessment & Plan:   Problem List Items Addressed This Visit      Cardiovascular and  Mediastinum   Hypertension - Primary (Chronic)    Controlled; encouraged DASH guidelines  Respiratory   OSA (obstructive sleep apnea)    Not wearing CPAP; weight loss encouraged        Genitourinary   Interstitial cystitis     Other   Vitamin D deficiency    Check vitamin D level and replace if needed      Relevant Orders   VITAMIN D 25 Hydroxy (Vit-D Deficiency, Fractures)   Vitamin B12 deficiency    Check B12 deficiency      Relevant Orders   Vitamin B12   Prediabetes (Chronic)    Check glucose and A1c; weight loss important      Relevant Orders   Hemoglobin A1c   Morbid obesity (HCC) (Chronic)    BMI >35 plus HTN, hyperlipidemia, reflux, sleep apnea; see AVS for education given      Medication monitoring encounter    Check liver and kidneys      Relevant Orders   CBC with Differential/Platelet   COMPLETE METABOLIC PANEL WITH GFR   Hyperlipidemia (Chronic)    Check lipids; limit saturated fats      Relevant Orders   Lipid panel   Bilateral primary osteoarthritis of knee    Managed by orthopaedist; weight loss encouraged       Other Visit Diagnoses    Estrogen deficiency       Relevant Orders   DG Bone Density   Screening for breast cancer       Relevant Orders   MM 3D SCREEN BREAST BILATERAL   Constipation, unspecified constipation type       Relevant Orders   TSH   Pelvic pain       refer to GYN   Relevant Orders   Ambulatory referral to Obstetrics / Gynecology       Follow up plan: Return in about 3 weeks (around 05/06/2018) for follow-up visit with Dr. Sanda Klein.  An after-visit summary was printed and given to the patient at [facility].  Please see the patient instructions which may contain other information and recommendations beyond what is mentioned above in the assessment and plan.  No orders of the defined types were placed in this encounter.   Orders Placed This Encounter  Procedures  . MM 3D SCREEN BREAST BILATERAL  . DG  Bone Density  . CBC with Differential/Platelet  . COMPLETE METABOLIC PANEL WITH GFR  . Hemoglobin A1c  . Lipid panel  . TSH  . VITAMIN D 25 Hydroxy (Vit-D Deficiency, Fractures)  . Vitamin B12  . Ambulatory referral to Obstetrics / Gynecology

## 2018-04-15 NOTE — Telephone Encounter (Signed)
[facility] referring for Pelvic pain. Voicemail not set up unable to leave message

## 2018-04-15 NOTE — Assessment & Plan Note (Signed)
Controlled; encouraged DASH guidelines

## 2018-04-16 LAB — CBC WITH DIFFERENTIAL/PLATELET
Absolute Monocytes: 540 cells/uL (ref 200–950)
Basophils Absolute: 58 cells/uL (ref 0–200)
Basophils Relative: 0.8 %
Eosinophils Absolute: 223 cells/uL (ref 15–500)
Eosinophils Relative: 3.1 %
HCT: 42.1 % (ref 35.0–45.0)
Hemoglobin: 14.3 g/dL (ref 11.7–15.5)
Lymphs Abs: 2945 cells/uL (ref 850–3900)
MCH: 30.8 pg (ref 27.0–33.0)
MCHC: 34 g/dL (ref 32.0–36.0)
MCV: 90.7 fL (ref 80.0–100.0)
MONOS PCT: 7.5 %
MPV: 10.3 fL (ref 7.5–12.5)
Neutro Abs: 3434 cells/uL (ref 1500–7800)
Neutrophils Relative %: 47.7 %
Platelets: 249 10*3/uL (ref 140–400)
RBC: 4.64 10*6/uL (ref 3.80–5.10)
RDW: 13 % (ref 11.0–15.0)
Total Lymphocyte: 40.9 %
WBC: 7.2 10*3/uL (ref 3.8–10.8)

## 2018-04-16 LAB — HEMOGLOBIN A1C
Hgb A1c MFr Bld: 5.9 % of total Hgb — ABNORMAL HIGH (ref <5.7)
Mean Plasma Glucose: 123 (calc)
eAG (mmol/L): 6.8 (calc)

## 2018-04-16 LAB — COMPLETE METABOLIC PANEL WITH GFR
AG Ratio: 1.4 (calc) (ref 1.0–2.5)
ALT: 12 U/L (ref 6–29)
AST: 13 U/L (ref 10–35)
Albumin: 4.1 g/dL (ref 3.6–5.1)
Alkaline phosphatase (APISO): 80 U/L (ref 33–130)
BUN/Creatinine Ratio: 19 (calc) (ref 6–22)
BUN: 21 mg/dL (ref 7–25)
CO2: 27 mmol/L (ref 20–32)
Calcium: 9.9 mg/dL (ref 8.6–10.4)
Chloride: 104 mmol/L (ref 98–110)
Creat: 1.08 mg/dL — ABNORMAL HIGH (ref 0.50–0.99)
GFR, Est African American: 62 mL/min/{1.73_m2} (ref >=60)
GFR, Est Non African American: 53 mL/min/{1.73_m2} — ABNORMAL LOW (ref >=60)
GLUCOSE: 133 mg/dL (ref 65–139)
Globulin: 2.9 g/dL (calc) (ref 1.9–3.7)
Potassium: 3.9 mmol/L (ref 3.5–5.3)
Sodium: 142 mmol/L (ref 135–146)
Total Bilirubin: 0.3 mg/dL (ref 0.2–1.2)
Total Protein: 7 g/dL (ref 6.1–8.1)

## 2018-04-16 LAB — VITAMIN B12: Vitamin B-12: 457 pg/mL (ref 200–1100)

## 2018-04-16 LAB — TSH: TSH: 3.54 mIU/L (ref 0.40–4.50)

## 2018-04-16 LAB — LIPID PANEL
Cholesterol: 245 mg/dL — ABNORMAL HIGH (ref <200)
HDL: 46 mg/dL — ABNORMAL LOW (ref >50)
LDL Cholesterol (Calc): 147 mg/dL (calc) — ABNORMAL HIGH
Non-HDL Cholesterol (Calc): 199 mg/dL (calc) — ABNORMAL HIGH (ref <130)
Total CHOL/HDL Ratio: 5.3 (calc) — ABNORMAL HIGH (ref <5.0)
Triglycerides: 338 mg/dL — ABNORMAL HIGH (ref <150)

## 2018-04-16 LAB — VITAMIN D 25 HYDROXY (VIT D DEFICIENCY, FRACTURES): Vit D, 25-Hydroxy: 46 ng/mL (ref 30–100)

## 2018-04-18 ENCOUNTER — Other Ambulatory Visit: Payer: Self-pay | Admitting: Family Medicine

## 2018-04-18 MED ORDER — ATORVASTATIN CALCIUM 20 MG PO TABS: 20.0000 mg | ORAL_TABLET | Freq: Every day | ORAL | 1 refills | Status: DC

## 2018-04-19 ENCOUNTER — Other Ambulatory Visit: Payer: Self-pay | Admitting: Family Medicine

## 2018-04-19 NOTE — Progress Notes (Signed)
Patient refuses chol med; removed from list

## 2018-04-26 ENCOUNTER — Encounter: Payer: Self-pay | Admitting: Obstetrics and Gynecology

## 2018-05-07 ENCOUNTER — Ambulatory Visit: Payer: Medicare HMO | Admitting: Family Medicine

## 2018-05-15 ENCOUNTER — Encounter: Payer: Self-pay | Admitting: Obstetrics and Gynecology

## 2018-06-24 ENCOUNTER — Other Ambulatory Visit: Payer: Self-pay | Admitting: Family Medicine

## 2018-06-24 NOTE — Telephone Encounter (Signed)
New patient from last time

## 2018-06-25 NOTE — Telephone Encounter (Signed)
Reviewed last BP and pulse Rx approved

## 2018-06-28 ENCOUNTER — Ambulatory Visit: Payer: Medicare HMO

## 2018-09-27 ENCOUNTER — Ambulatory Visit: Payer: Self-pay

## 2018-09-27 NOTE — Telephone Encounter (Signed)
She can do a luke-warm colloidal oatmeal bath, apply hydrocortisone cream to areas that are not open and not on the face, breasts, armpits, or genitals. Benadryl and calamine lotion.  Could try pramoxine topical as well.

## 2018-09-27 NOTE — Telephone Encounter (Signed)
Patient called.  Patient aware.  

## 2018-09-27 NOTE — Telephone Encounter (Signed)
Patient called and says she has poison ivy since a week ago. She says the rash is between her finger, on her ankles, under her breast, behind her knees. She says it's itching and she's been using benadryl cream and taking Benadryl at night. She says she's wanting to know if there is something else beside calamine lotion she can use OTC. I advised of the need for a visit. I called the office and spoke to Rosa, [facility] who says they are booked for today and to have the patient do an e-visit or go to the UC. I advised the patient and she says she doesn't have a computer and doesn't want to go out, so she asks if someone can call and recommend something, she says she will call on Monday if it's worse. I gave care advice.  Reason for Disposition . Severe poison ivy, oak, or sumac reaction in the past  Answer Assessment - Initial Assessment Questions 1. APPEARANCE of RASH: "Describe the rash."      Red, blistery between finger, barely raised 2. LOCATION: "Where is the rash located?"      Behind knees in the creases, between fingers, ankles, under one of the breast 3. SIZE: "How large is the rash?"      Hard to describe 4. ONSET: "When did the rash begin?"      1 week ago 5. ITCHING: "Does the rash itch?" If so, ask: "How bad is it?"   - MILD - doesn't interfere with normal activities   - MODERATE - SEVERE: interferes with work, school, sleep, or other activities      Yes,  6. PREGNANCY: "Is there any chance you are pregnant?" "When was your last menstrual period?"     No  Protocols used: POISON IVY - OAK - SUMAC-A-AH

## 2018-09-28 ENCOUNTER — Ambulatory Visit
Admission: EM | Admit: 2018-09-28 | Discharge: 2018-09-28 | Disposition: A | Discharge status: Home / Self Care | Payer: Medicare HMO | Attending: Family Medicine | Admitting: Family Medicine

## 2018-09-28 DIAGNOSIS — L237 Allergic contact dermatitis due to plants, except food: Secondary | ICD-10-CM

## 2018-09-28 MED ORDER — PREDNISONE 10 MG PO TABS: ORAL_TABLET | ORAL | 0 refills | Status: DC

## 2018-09-28 NOTE — ED Triage Notes (Signed)
Pt here for poison ivy for 8-10 days and has a history of OCD and has been scratching it. It's on her face, arms, ankles and hands. Does have on benadryl cream. Does complain of itching.

## 2018-09-28 NOTE — ED Provider Notes (Signed)
[facility] ____________________________________________  Time seen: Approximately 2:02 PM  I have reviewed the triage vital signs and the nursing notes.   HISTORY  Chief Complaint Poison Ivy   HPI Katherine Gould is a 68 y.o. female presenting for evaluation of rash.  Patient reports 1.5-week ago she was working outside and removing a plant that grew inside of her great findings, and states afterwards she realized it was poison oak.  Patient reports that she has had itchy rash on bilateral arms and bilateral lower legs that has continued to spread and now having to upper legs, some to torso and to her chin.  States rash is very itchy and she has a hard time not scratching it.  Denies pain.  Denies any other known changes in triggers or contacts.  Has tried multiple over-the-counter creams, soaks and anti-itch remedies without resolution.  Reports otherwise doing well.  Denies fevers, cough, congestion, chest pain or shortness of breath.  Denies diabetes.  PCP: Sanda Klein    Past Medical History:  Diagnosis Date  . Cervical polyp   . Dysthymic disorder   . Esophageal reflux   . Hyperlipidemia   . Hypertension   . Obsessive-compulsive disorders   . Other abnormal glucose   . Other abnormal glucose   . Other B-complex deficiencies     Patient Active Problem List   Diagnosis Date Noted  . Anxiety and depression 04/15/2018  . OCD (obsessive compulsive disorder) 04/15/2018  . Reflux 04/15/2018  . Morbid obesity ([place]) 04/15/2018  . Medication monitoring encounter 04/15/2018  . Vitamin B12 deficiency 04/15/2018  . Vitamin D deficiency 04/15/2018  . Bilateral primary osteoarthritis of knee 04/15/2018  . Interstitial cystitis 04/15/2018  . OSA (obstructive sleep apnea) 07/14/2015  . Chest pain 08/12/2013  . Dyspnea 08/12/2013  . Hypertension   . Hyperlipidemia 07/03/2012  . Prediabetes 07/03/2012    Past Surgical History:  Procedure Laterality Date  . LASER ABLATION  CONDYLOMA CERVICAL / VULVAR       No current facility-administered medications for this encounter.   Current Outpatient Medications:  .  bisoprolol-hydrochlorothiazide (ZIAC) 10-6.25 MG tablet, TAKE 1 TABLET BY MOUTH EVERY DAY, Disp: 90 tablet, Rfl: 1 .  clonazePAM (KLONOPIN) 1 MG tablet, Take 1 mg by mouth 2 (two) times daily., Disp: , Rfl:  .  venlafaxine (EFFEXOR) 75 MG tablet, Take 75 mg by mouth 2 (two) times daily., Disp: , Rfl:  .  ALPRAZolam (XANAX PO), Take 1 mg by mouth as needed. , Disp: , Rfl:  .  b complex vitamins tablet, Take 1 tablet by mouth daily., Disp: , Rfl:  .  Cholecalciferol (VITAMIN D3) 2000 UNITS capsule, Take 2,000 Units by mouth daily., Disp: , Rfl:  .  clorazepate (TRANXENE) 7.5 MG tablet, Take 7.5 mg by mouth at bedtime. , Disp: , Rfl:  .  famotidine (PEPCID) 20 MG tablet, Take 20 mg by mouth 2 (two) times daily., Disp: , Rfl:  .  glucosamine-chondroitin 500-400 MG tablet, Take 1 tablet by mouth 3 (three) times daily., Disp: , Rfl:  .  predniSONE (DELTASONE) 10 MG tablet, Take 60mg  orally day one, then 55 mg orally day two, then 50 mg orally day three, then taper by 5 mg daily over 12 days until complete., Disp: 35 tablet, Rfl: 0 .  silver sulfADIAZINE (SILVADENE) 1 % cream, Apply 1 application topically as needed. , Disp: , Rfl:   Allergies Aripiprazole and Quetiapine  Family History  Problem Relation Age of Onset  .  Heart failure Mother   . Aortic stenosis Mother   . Hypertension Mother   . Stroke Mother   . Glaucoma Mother   . Heart attack Father   . Hypertension Father   . Heart attack Paternal Grandfather   . Glaucoma Sister   . Hypertension Sister   . Atrial fibrillation Sister   . Heart failure Sister   . Anxiety disorder Sister     Social History Social History   Tobacco Use  . Smoking status: Never Smoker  . Smokeless tobacco: Never Used  Substance Use Topics  . Alcohol use: No  . Drug use: No    Review of Systems  Constitutional: No fever ENT: No sore throat. Cardiovascular: Denies chest pain. Respiratory: Denies shortness of breath. Gastrointestinal: No abdominal pain.   Musculoskeletal: Negative extremity pain Skin: Positive for rash.  ____________________________________________   PHYSICAL EXAM:  VITAL SIGNS: ED Triage Vitals  Enc Vitals Group     BP 09/28/18 1338 (!) 164/78     Pulse Rate 09/28/18 1338 68     Resp 09/28/18 1338 18     Temp 09/28/18 1338 97.7 F (36.5 C)     Temp Source 09/28/18 1338 Oral     SpO2 09/28/18 1338 97 %     Weight 09/28/18 1344 225 lb (102.1 kg)     Height --      Head Circumference --      Peak Flow --      Pain Score 09/28/18 1341 0     Pain Loc --      Pain Edu? --      Excl. in [place]? --     Constitutional: Alert and oriented. Well appearing and in no acute distress. Eyes: Conjunctivae are normal.  No surrounding erythema or edema bilaterally. ENT      Head: Normocephalic and atraumatic. Cardiovascular: Normal rate, regular rhythm. Grossly normal heart sounds.  Good peripheral circulation. Respiratory: Normal respiratory effort without tachypnea nor retractions. Breath sounds are clear and equal bilaterally. No wheezes, rales, rhonchi. Musculoskeletal: Steady gait Neurologic:  Normal speech and language.  Speech is normal. No gait instability.  Skin:  Skin is warm, dry.  Except: Bilateral upper and lower extremities as well as anterior abdomen and few areas to lower chin clustered areas of erythematous vesicular rash, pruritic, patient actively scratching in room, no drainage, no induration no surrounding erythema. Psychiatric: Mood and affect are normal. Speech and behavior are normal. Patient exhibits appropriate insight and judgment   ___________________________________________   LABS (all labs ordered are listed, but only abnormal results are displayed)  Labs Reviewed - No data to display ____________________________________________    PROCEDURES Procedures    INITIAL IMPRESSION / ASSESSMENT AND PLAN / ED COURSE  Pertinent labs & imaging results that were available during my care of the patient were reviewed by me and considered in my medical decision making (see chart for details).  Well-appearing patient.  No acute distress.  Subjective report and clinical appearance consistent with contact dermatitis from poison oak.  Patient actively scratching.  No appearance of secondary cellulitis.  Counseled to avoid scratching to avoid secondary infection.  Will treat with oral prednisone taper.  Continue supportive care at home, calamine, cool compresses and avoid scratching.  Avoid trigger.  Discussed indication, risks and benefits of medications with patient.  Discussed follow up with Primary care physician this week. Discussed follow up and return parameters including no resolution or any worsening concerns. Patient verbalized understanding and agreed  to plan.   ____________________________________________   FINAL CLINICAL IMPRESSION(S) / ED DIAGNOSES  Final diagnoses:  Allergic contact dermatitis due to plants, except food     ED Discharge Orders         Ordered    predniSONE (DELTASONE) 10 MG tablet     09/28/18 1400           Note: This dictation was prepared with Dragon dictation along with smaller phrase technology. Any transcriptional errors that result from this process are unintentional.         Marylene Land, NP 09/28/18 1406

## 2018-09-28 NOTE — Discharge Instructions (Addendum)
Take medication as prescribed. Rest. Drink plenty of fluids. Calamine. Cool compresses. Avoid scratching.   Follow up with your primary care physician this week as needed. Return to Urgent care for new or worsening concerns.

## 2018-10-03 IMAGING — US US EXTREM LOW VENOUS*L*
1 series · 13 of 24 positions shown · non-contrast
Comparison: None.

CLINICAL DATA: Left leg pain for 2-3 months.



[Series 1: us extrem low venous*left* · 0.08mm/px · 13 of 36 slices shown]
[im 1/36]
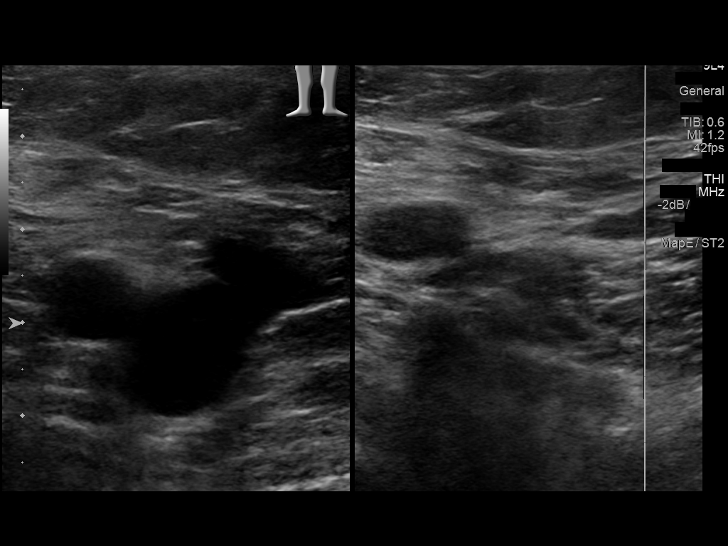
[im 4/36]
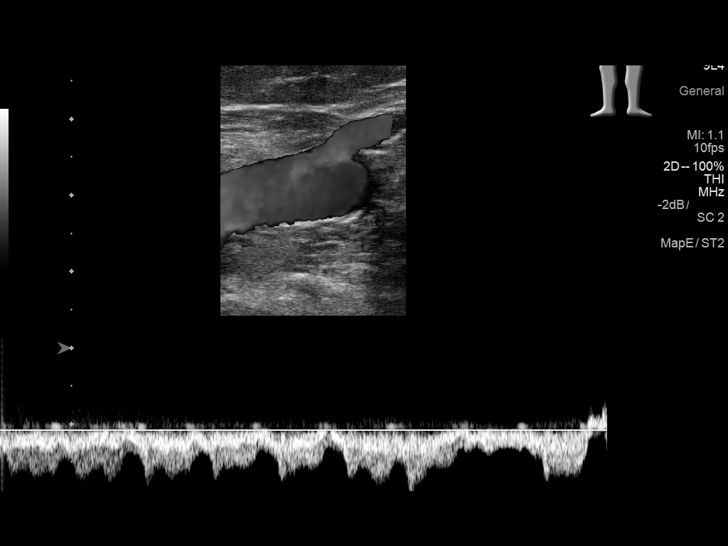
[im 7/36]
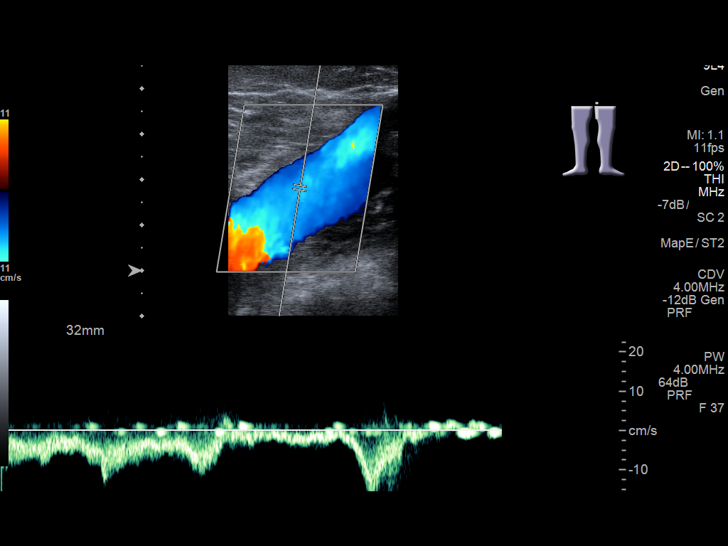
[im 10/36]
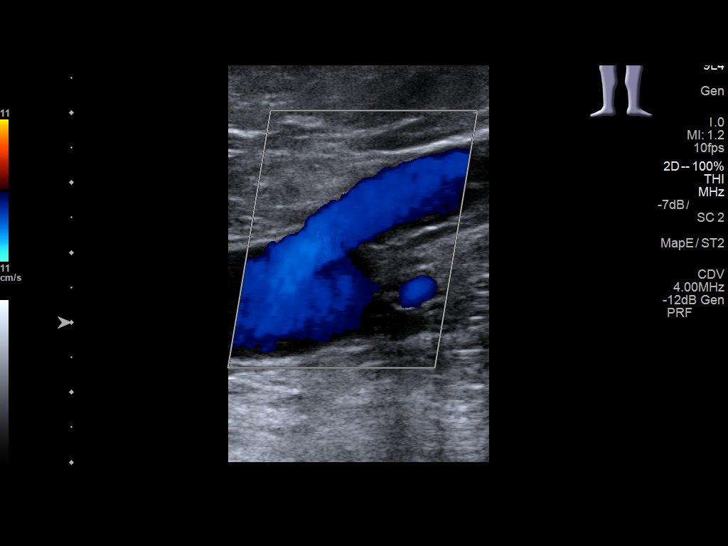
[im 13/36]
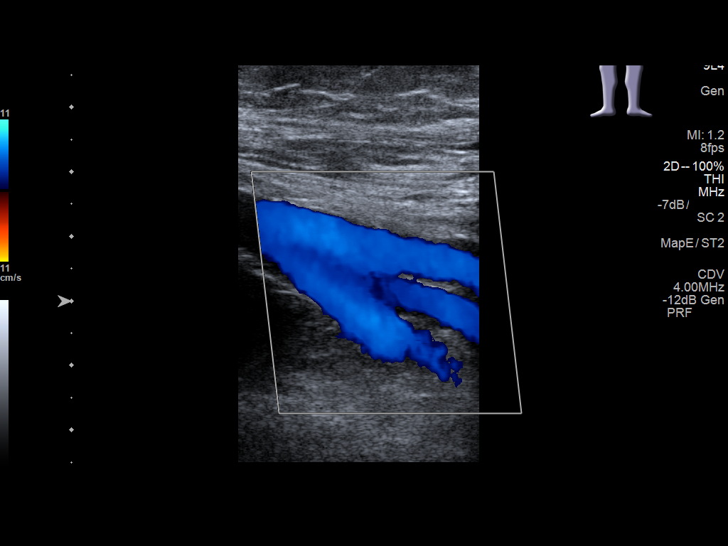
[im 16/36]
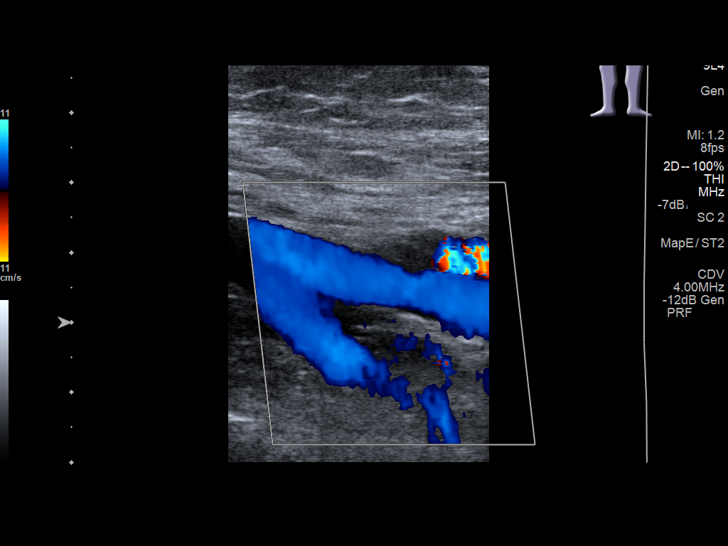
[im 19/36]
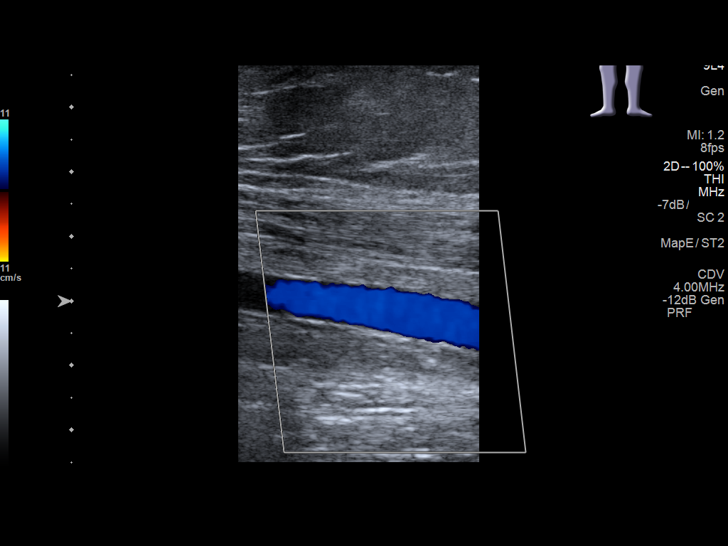
[im 20/36]
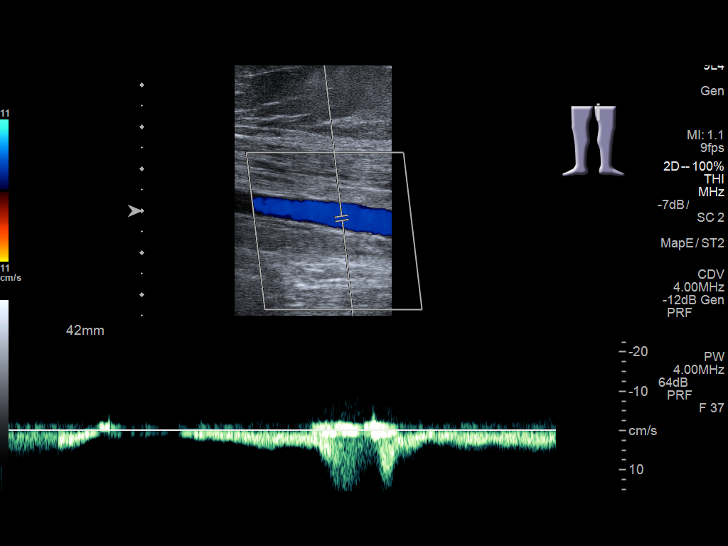
[im 23/36]
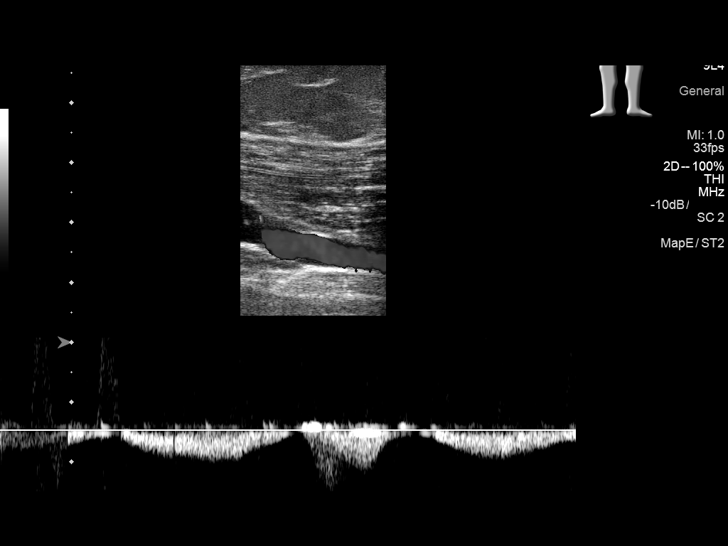
[im 26/36]
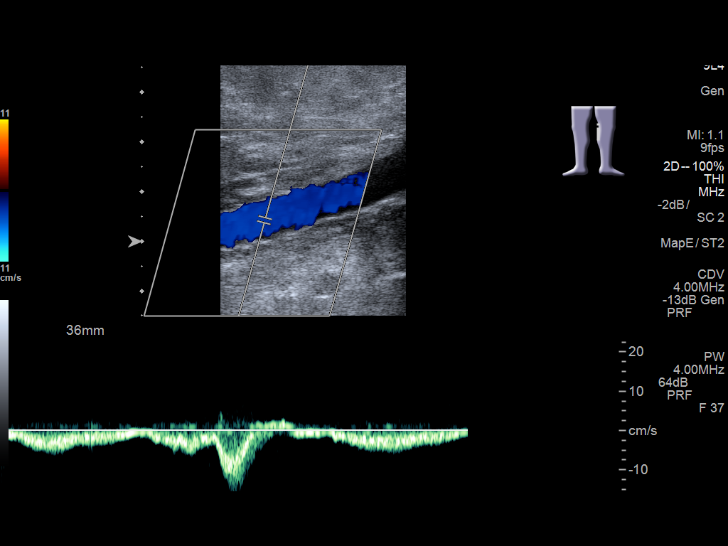
[im 29/36]
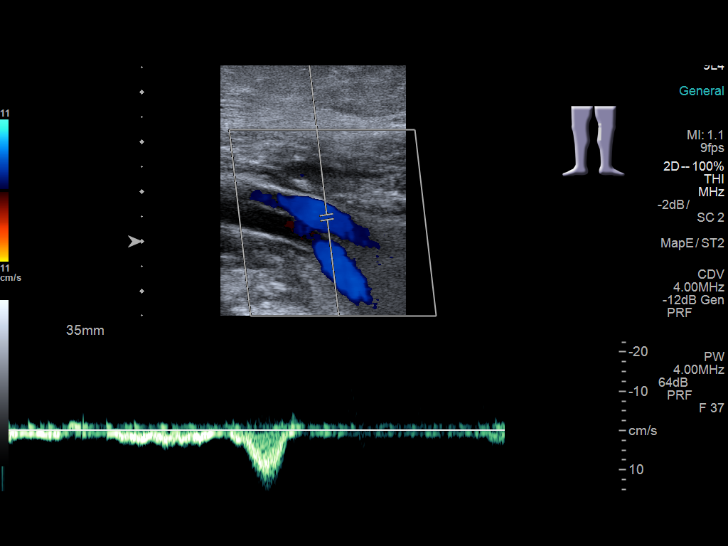
[im 32/36]
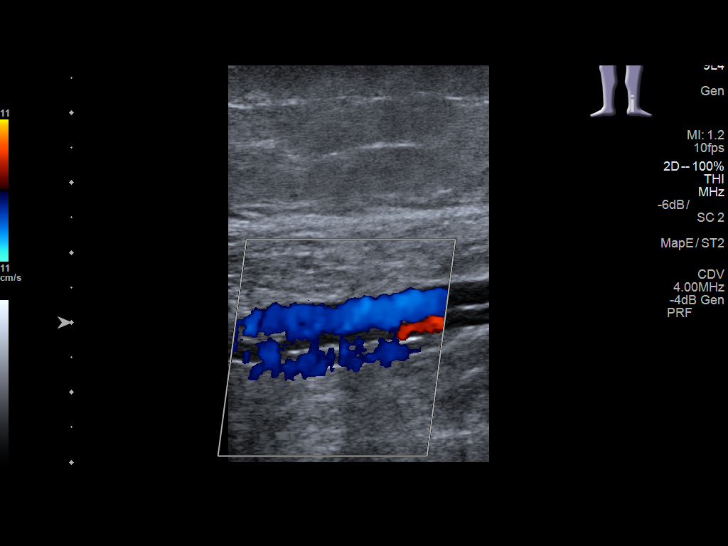
[im 36/36]
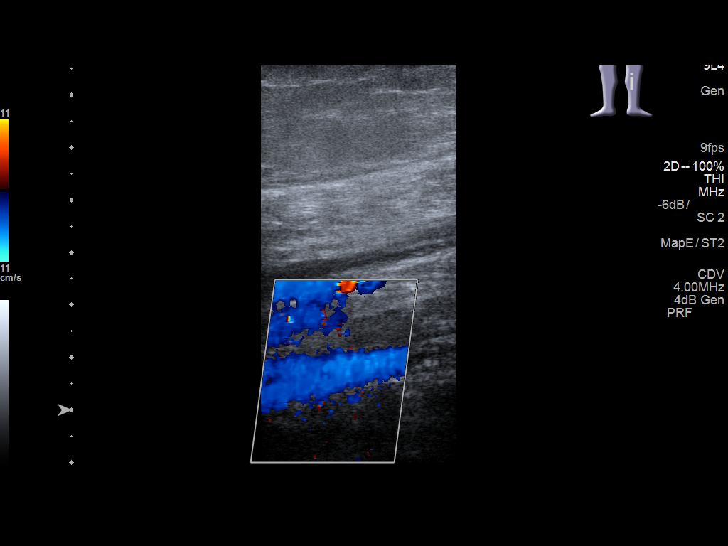

[13 of 24 positions shown; findings below may reference images not displayed]

FINDINGS: Contralateral Common Femoral Vein: Respiratory phasicity is normal
and symmetric with the symptomatic side. No evidence of thrombus.
Normal compressibility.

Common Femoral Vein: No evidence of thrombus. Normal
compressibility, respiratory phasicity and response to augmentation.

Saphenofemoral Junction: No evidence of thrombus. Normal
compressibility and flow on color Doppler imaging.

Profunda Femoral Vein: No evidence of thrombus. Normal
compressibility and flow on color Doppler imaging.

Femoral Vein: No evidence of thrombus. Normal compressibility,
respiratory phasicity and response to augmentation.

Popliteal Vein: No evidence of thrombus. Normal compressibility,
respiratory phasicity and response to augmentation.

Calf Veins: No evidence of thrombus. Normal compressibility and flow
on color Doppler imaging.

Superficial Great Saphenous Vein: No evidence of thrombus. Normal
compressibility and flow on color Doppler imaging.

Venous Reflux:  None.

Other Findings:  None.
IMPRESSION: No evidence of DVT within the left lower extremity.

## 2018-10-04 ENCOUNTER — Telehealth: Payer: Self-pay | Admitting: Emergency Medicine

## 2018-10-04 NOTE — Telephone Encounter (Signed)
Patient called stating she will not have enough Prednisone 10mg  tablets to complete her taper. 35 pills were sent in and patient needed 38.5 pills. 4 additional Prednisone 10mg  pills sent to [facility] in [place] per Honor Loh, NP.

## 2018-10-11 ENCOUNTER — Ambulatory Visit
Admission: EM | Admit: 2018-10-11 | Discharge: 2018-10-11 | Disposition: A | Discharge status: Home / Self Care | Payer: Medicare HMO | Attending: Family Medicine | Admitting: Family Medicine

## 2018-10-11 ENCOUNTER — Encounter: Payer: Self-pay | Admitting: Gynecology

## 2018-10-11 DIAGNOSIS — R21 Rash and other nonspecific skin eruption: Secondary | ICD-10-CM

## 2018-10-11 MED ORDER — HYDROXYZINE HCL 25 MG PO TABS: 25.0000 mg | ORAL_TABLET | Freq: Three times a day (TID) | ORAL | 0 refills | Status: DC | PRN

## 2018-10-11 MED ORDER — PREDNISONE 10 MG PO TABS: ORAL_TABLET | ORAL | 0 refills | Status: DC

## 2018-10-11 MED ORDER — MUPIROCIN 2 % EX OINT: 1.0000 "application " | TOPICAL_OINTMENT | Freq: Two times a day (BID) | CUTANEOUS | 0 refills | Status: AC

## 2018-10-11 NOTE — Discharge Instructions (Signed)
Meds as prescribed. ° °Take care ° °Dr. Roey Coopman  °

## 2018-10-11 NOTE — ED Triage Notes (Signed)
Per patient was seen x 2 weeks ago for her rash. Pt. Stated rash not getting better and itching.

## 2018-10-11 NOTE — ED Provider Notes (Signed)
[facility]    CSN: [id number] Arrival date & time: 10/11/18  1315  History   Chief Complaint Chief Complaint  Patient presents with  . Rash   HPI  68 year old female presents with rash.  Patient recently seen on 6/13.  Diagnosed with dermatitis secondary to poison oak or poison ivy.  Treated with prednisone.  Patient states that she improved but her rash did not fully resolved.  She states that she continues to have rash as well as diffuse itching.  She states that she has OCD and this has complicated her course as she scratches and picks at the areas.  She has a rash on her chest, abdomen, upper and lower extremities.  Severe itching.  She states that Benadryl helps but wears off.  She has finished her course of steroids.  No reported new exposures.  No other associated symptoms.  No other complaints.  PMH, Surgical Hx, Family Hx, Social History reviewed and updated as below.  Past Medical History:  Diagnosis Date  . Cervical polyp   . Dysthymic disorder   . Esophageal reflux   . Hyperlipidemia   . Hypertension   . Obsessive-compulsive disorders   . Other abnormal glucose   . Other abnormal glucose   . Other B-complex deficiencies     Patient Active Problem List   Diagnosis Date Noted  . Anxiety and depression 04/15/2018  . OCD (obsessive compulsive disorder) 04/15/2018  . Reflux 04/15/2018  . Morbid obesity ([place]) 04/15/2018  . Medication monitoring encounter 04/15/2018  . Vitamin B12 deficiency 04/15/2018  . Vitamin D deficiency 04/15/2018  . Bilateral primary osteoarthritis of knee 04/15/2018  . Interstitial cystitis 04/15/2018  . OSA (obstructive sleep apnea) 07/14/2015  . Chest pain 08/12/2013  . Dyspnea 08/12/2013  . Hypertension   . Hyperlipidemia 07/03/2012  . Prediabetes 07/03/2012    Past Surgical History:  Procedure Laterality Date  . LASER ABLATION CONDYLOMA CERVICAL / VULVAR      OB History   No obstetric history on file.       Home Medications    Prior to Admission medications   Medication Sig Start Date End Date Taking? Authorizing Provider  ALPRAZolam (XANAX PO) Take 1 mg by mouth as needed.    Yes [provider]  b complex vitamins tablet Take 1 tablet by mouth daily.   Yes [provider]  Cholecalciferol (VITAMIN D3) 2000 UNITS capsule Take 2,000 Units by mouth daily.   Yes [provider]  clonazePAM (KLONOPIN) 1 MG tablet Take 1 mg by mouth 2 (two) times daily.   Yes [provider]  famotidine (PEPCID) 20 MG tablet Take 20 mg by mouth 2 (two) times daily.   Yes [provider]  silver sulfADIAZINE (SILVADENE) 1 % cream Apply 1 application topically as needed.    Yes [provider]  clorazepate (TRANXENE) 7.5 MG tablet Take 7.5 mg by mouth at bedtime.     [provider]  hydrOXYzine (ATARAX/VISTARIL) 25 MG tablet Take 1 tablet (25 mg total) by mouth every 8 (eight) hours as needed. 10/11/18   Coral Spikes, DO  mupirocin ointment (BACTROBAN) 2 % Apply 1 application topically 2 (two) times daily for 7 days. 10/11/18 10/18/18  Coral Spikes, DO  predniSONE (DELTASONE) 10 MG tablet 50 mg daily x 3 days, then 40 mg daily x 3 days, then 30 mg daily x 3 days, then 20 mg daily x 3 days, then 10 mg daily x 3 days.  10/11/18   Coral Spikes, DO  venlafaxine (EFFEXOR) 75 MG tablet Take 75 mg by mouth 2 (two) times daily.    [provider]  bisoprolol-hydrochlorothiazide [facility]) 10-6.25 MG tablet TAKE 1 TABLET BY MOUTH EVERY DAY 06/25/18 10/11/18  Arnetha Courser, MD    Family History Family History  Problem Relation Age of Onset  . Heart failure Mother   . Aortic stenosis Mother   . Hypertension Mother   . Stroke Mother   . Glaucoma Mother   . Heart attack Father   . Hypertension Father   . Heart attack Paternal Grandfather   . Glaucoma Sister   . Hypertension Sister   . Atrial fibrillation Sister   . Heart failure Sister   . Anxiety disorder  Sister     Social History Social History   Tobacco Use  . Smoking status: Never Smoker  . Smokeless tobacco: Never Used  Substance Use Topics  . Alcohol use: No  . Drug use: No     Allergies   Aripiprazole and Quetiapine   Review of Systems Review of Systems  Constitutional: Negative.   Skin: Positive for rash.       Itching.   Physical Exam Triage Vital Signs ED Triage Vitals  Enc Vitals Group     BP 10/11/18 1324 (!) 155/86     Pulse Rate 10/11/18 1324 73     Resp 10/11/18 1324 16     Temp 10/11/18 1324 98.1 F (36.7 C)     Temp Source 10/11/18 1324 Oral     SpO2 10/11/18 1324 95 %     Weight 10/11/18 1324 224 lb (101.6 kg)     Height 10/11/18 1324 5\' 6"  (1.676 m)     Head Circumference --      Peak Flow --      Pain Score 10/11/18 1323 0     Pain Loc --      Pain Edu? --      Excl. in [place]? --    Updated Vital Signs BP (!) 155/86 (BP Location: Left Arm)   Pulse 73   Temp 98.1 F (36.7 C) (Oral)   Resp 16   Ht 5\' 6"  (1.676 m)   Wt 101.6 kg   SpO2 95%   BMI 36.15 kg/m   Visual Acuity Right Eye Distance:   Left Eye Distance:   Bilateral Distance:    Right Eye Near:   Left Eye Near:    Bilateral Near:     Physical Exam Vitals signs and nursing note reviewed.  Constitutional:      General: She is not in acute distress.    Appearance: Normal appearance. She is obese.  HENT:     Head: Normocephalic and atraumatic.  Eyes:     General:        Right eye: No discharge.        Left eye: No discharge.     Conjunctiva/sclera: Conjunctivae normal.  Pulmonary:     Effort: Pulmonary effort is normal. No respiratory distress.  Skin:    Comments: Scattered areas of raised, erythematous rash.  Patient has some open areas due to excoriation.  Neurological:     Mental Status: She is alert.  Psychiatric:        Mood and Affect: Mood normal.        Behavior: Behavior normal.     UC Treatments / Results  Labs (all labs ordered are listed, but only  abnormal results are  displayed) Labs Reviewed - No data to display  EKG None  Radiology No results found.  Procedures Procedures (including critical care time)  Medications Ordered in UC Medications - No data to display  Initial Impression / Assessment and Plan / UC Course  I have reviewed the triage vital signs and the nursing notes.  Pertinent labs & imaging results that were available during my care of the patient were reviewed by me and considered in my medical decision making (see chart for details).    68 year old female presents with rash.  Likely secondary to poison oak and poison ivy.  Recent treatment but flare again.  Treating with long course of prednisone, Atarax, Bactroban.  Final Clinical Impressions(s) / UC Diagnoses   Final diagnoses:  Rash     Discharge Instructions     Meds as prescribed.  Take care  Dr. Lacinda Axon    ED Prescriptions    Medication Sig Dispense Auth. Provider   mupirocin ointment (BACTROBAN) 2 % Apply 1 application topically 2 (two) times daily for 7 days. 22 g Arlen Legendre G, DO   predniSONE (DELTASONE) 10 MG tablet 50 mg daily x 3 days, then 40 mg daily x 3 days, then 30 mg daily x 3 days, then 20 mg daily x 3 days, then 10 mg daily x 3 days. 45 tablet Sreshta Cressler G, DO   hydrOXYzine (ATARAX/VISTARIL) 25 MG tablet Take 1 tablet (25 mg total) by mouth every 8 (eight) hours as needed. 30 tablet Coral Spikes, DO     Controlled Substance Prescriptions [place] Controlled Substance Registry consulted? Not Applicable   Coral Spikes, DO 10/11/18 1412

## 2018-11-29 DIAGNOSIS — M5442 Lumbago with sciatica, left side: Secondary | ICD-10-CM | POA: Diagnosis not present

## 2018-11-29 DIAGNOSIS — M9903 Segmental and somatic dysfunction of lumbar region: Secondary | ICD-10-CM | POA: Diagnosis not present

## 2018-11-29 DIAGNOSIS — M25552 Pain in left hip: Secondary | ICD-10-CM | POA: Diagnosis not present

## 2018-11-29 DIAGNOSIS — M25562 Pain in left knee: Secondary | ICD-10-CM | POA: Diagnosis not present

## 2018-12-10 ENCOUNTER — Encounter: Payer: Self-pay | Admitting: Nurse Practitioner

## 2018-12-10 ENCOUNTER — Other Ambulatory Visit: Payer: Self-pay

## 2018-12-10 ENCOUNTER — Ambulatory Visit (INDEPENDENT_AMBULATORY_CARE_PROVIDER_SITE_OTHER): Payer: Medicare HMO | Admitting: Nurse Practitioner

## 2018-12-10 VITALS — BP 142/94 | HR 95 | Temp 96.6°F | Resp 14 | Ht 64.5 in | Wt 223.5 lb

## 2018-12-10 DIAGNOSIS — K219 Gastro-esophageal reflux disease without esophagitis: Secondary | ICD-10-CM | POA: Diagnosis not present

## 2018-12-10 DIAGNOSIS — I10 Essential (primary) hypertension: Secondary | ICD-10-CM | POA: Diagnosis not present

## 2018-12-10 DIAGNOSIS — R69 Illness, unspecified: Secondary | ICD-10-CM | POA: Diagnosis not present

## 2018-12-10 DIAGNOSIS — E782 Mixed hyperlipidemia: Secondary | ICD-10-CM

## 2018-12-10 DIAGNOSIS — R7303 Prediabetes: Secondary | ICD-10-CM

## 2018-12-10 DIAGNOSIS — G4733 Obstructive sleep apnea (adult) (pediatric): Secondary | ICD-10-CM | POA: Diagnosis not present

## 2018-12-10 DIAGNOSIS — F429 Obsessive-compulsive disorder, unspecified: Secondary | ICD-10-CM

## 2018-12-10 DIAGNOSIS — Z5181 Encounter for therapeutic drug level monitoring: Secondary | ICD-10-CM

## 2018-12-10 MED ORDER — FAMOTIDINE 20 MG PO TABS: 20.0000 mg | ORAL_TABLET | Freq: Two times a day (BID) | ORAL | 1 refills | Status: DC

## 2018-12-10 MED ORDER — BISOPROLOL-HYDROCHLOROTHIAZIDE 10-6.25 MG PO TABS: 1.0000 | ORAL_TABLET | Freq: Every day | ORAL | 1 refills | Status: DC

## 2018-12-10 NOTE — Patient Instructions (Signed)

## 2018-12-10 NOTE — Progress Notes (Addendum)
Name: Katherine Gould   MRN: [id number]    DOB: 12-18-1950   Date:12/10/2018       Progress Note  Subjective  Chief Complaint  Chief Complaint  Patient presents with  . Follow-up  . Medication Refill    HPI  Essential hypertension Medications: bisoprolol 10mg  and HCTZ 6.25mg  daily with no missed doses.  Sodium intake: lots of sodium in diet.  Denies headaches, blurry vision, dizziness, lightheadedness, chest pain  BP Readings from Last 3 Encounters:  12/10/18 (!) 142/94  10/11/18 (!) 155/86  09/28/18 (!) 164/78    OSA (obstructive sleep apnea) Was told it was mild and was told does not need CPAP, states has not noticed issue.   Hyperlipidemia Medications: none- states cannot tolerate statins  Red meat: 4-5 times a week  Fried foods: daily, has cut down on fries  Lab Results  Component Value Date   CHOL 245 (H) 04/15/2018   HDL 46 (L) 04/15/2018   LDLCALC 147 (H) 04/15/2018   TRIG 338 (H) 04/15/2018   CHOLHDL 5.3 (H) 04/15/2018  The 10-year ASCVD risk score Mikey Bussing DC Jr., et al., 2013) is: 10.6%   Values used to calculate the score:     Age: 68 years     Sex: Female     Is Non-Hispanic African American: No     Diabetic: No     Tobacco smoker: No     Systolic Blood Pressure: [id number] mmHg     Is BP treated: No     HDL Cholesterol: 46 mg/dL     Total Cholesterol: 245 mg/dL  Prediabetes Diet: eats a lot of fast-food- wendys gets the grilled chicken sandwich; frozen meals and occassionally makes pasta.  Lab Results  Component Value Date   HGBA1C 5.9 (H) 04/15/2018   Gastroesophageal reflux disease without esophagitis Take famotidine BID daily Triggers: has not noticed any specific triggers, well controlled on medicine.   MDD, Anxiety, OCD She is taking effexor 75mg  once daily.  She takes Klonopin 1mg  BID for anxiety Psychiatrist: Dr. Kasandra Knudsen ; Therapist: Marjie Skiff  Has appointment tomorrow.    PHQ2/9: Depression screen [facility] 2/9 12/10/2018 04/15/2018  Decreased  Interest 3 3  Down, Depressed, Hopeless 3 3  PHQ - 2 Score 6 6  Altered sleeping 3 3  Tired, decreased energy 3 3  Change in appetite 3 2  Feeling bad or failure about yourself  3 3  Trouble concentrating 3 3  Moving slowly or fidgety/restless 3 3  Suicidal thoughts 0 1  PHQ-9 Score 24 24  Difficult doing work/chores Very difficult Extremely dIfficult    PHQ reviewed. Positive  Patient Active Problem List   Diagnosis Date Noted  . GERD (gastroesophageal reflux disease) 12/10/2018  . Anxiety and depression 04/15/2018  . OCD (obsessive compulsive disorder) 04/15/2018  . Morbid obesity ([place]) 04/15/2018  . Medication monitoring encounter 04/15/2018  . Vitamin B12 deficiency 04/15/2018  . Vitamin D deficiency 04/15/2018  . Bilateral primary osteoarthritis of knee 04/15/2018  . Interstitial cystitis 04/15/2018  . OSA (obstructive sleep apnea) 07/14/2015  . Hypertension   . Hyperlipidemia 07/03/2012  . Prediabetes 07/03/2012    Past Medical History:  Diagnosis Date  . Cervical polyp   . Dysthymic disorder   . Esophageal reflux   . Hyperlipidemia   . Hypertension   . Obsessive-compulsive disorders   . Other abnormal glucose   . Other abnormal glucose   . Other B-complex deficiencies     Past Surgical History:  Procedure  Laterality Date  . LASER ABLATION CONDYLOMA CERVICAL / VULVAR      Social History   Tobacco Use  . Smoking status: Never Smoker  . Smokeless tobacco: Never Used  Substance Use Topics  . Alcohol use: No     Current Outpatient Medications:  .  Cholecalciferol (VITAMIN D3) 2000 UNITS capsule, Take 2,000 Units by mouth daily., Disp: , Rfl:  .  clonazePAM (KLONOPIN) 1 MG tablet, Take 1 mg by mouth 2 (two) times daily., Disp: , Rfl:  .  famotidine (PEPCID) 20 MG tablet, Take 20 mg by mouth 2 (two) times daily., Disp: , Rfl:  .  Turmeric 500 MG CAPS, Take by mouth., Disp: , Rfl:  .  venlafaxine (EFFEXOR) 75 MG tablet, Take 75 mg by mouth daily.,  Disp: , Rfl:  .  b complex vitamins tablet, Take 1 tablet by mouth daily., Disp: , Rfl:   Allergies  Allergen Reactions  . Aripiprazole Other (See Comments)    Incubus Dream tremmors   . Quetiapine Other (See Comments)    Incubus dream    ROS   No other specific complaints in a complete review of systems (except as listed in HPI above).  Objective  Vitals:   12/10/18 1407 12/10/18 1413 12/10/18 1416  BP: (!) 154/100 (!) 142/94 (!) 142/94  Pulse: 95    Resp: 14    Temp: (!) 96.6 F (35.9 C)    SpO2: 97%    Weight: 223 lb 8 oz (101.4 kg)       Body mass index is 36.07 kg/m.  Nursing Note and Vital Signs reviewed.  Physical Exam Vitals signs reviewed.  Constitutional:      Appearance: She is well-developed.  HENT:     Head: Normocephalic and atraumatic.  Neck:     Musculoskeletal: Normal range of motion and neck supple.     Vascular: No carotid bruit.  Cardiovascular:     Heart sounds: Normal heart sounds.  Pulmonary:     Effort: Pulmonary effort is normal.     Breath sounds: Normal breath sounds.  Abdominal:     General: Bowel sounds are normal.     Palpations: Abdomen is soft.     Tenderness: There is no abdominal tenderness.  Musculoskeletal: Normal range of motion.  Skin:    General: Skin is warm and dry.     Capillary Refill: Capillary refill takes less than 2 seconds.  Neurological:     Mental Status: She is alert and oriented to person, place, and time.     GCS: GCS eye subscore is 4. GCS verbal subscore is 5. GCS motor subscore is 6.     Sensory: No sensory deficit.  Psychiatric:        Speech: Speech normal.        Behavior: Behavior normal.        Thought Content: Thought content normal.        Judgment: Judgment normal.      No results found for this or any previous visit (from the past 48 hour(s)).  Assessment & Plan  1. Essential hypertension Blood pressure is elevated today patient states is because she was anxious about being  laying and was rushing to get inside and hurt her knee.  Second blood pressure after resting was somewhat improved.  Discussed [place] guidelines in detail patient will work on healthy diet recheck in 1 month.  Consider increasing diuretic component of blood pressure medication to assist with mild lower extremity swelling. -  COMPLETE METABOLIC PANEL WITH GFR - bisoprolol-hydrochlorothiazide (ZIAC) 10-6.25 MG tablet; Take 1 tablet by mouth daily.  Dispense: 90 tablet; Refill: 1  2. OSA (obstructive sleep apnea) Stable consider sleep study in the future if worsening symptoms  3. Mixed hyperlipidemia Patient states cannot tolerate statin but will consider other medications if needed - Lipid Profile  4. Prediabetes Discussed diet - HgB A1c  5. Morbid obesity ([place]) She has BMI of greater than 35 with hypertension, hyperlipidemia, sleep apnea.  Discussed healthy eating  6. Obsessive-compulsive disorder, unspecified type Follows up with psychiatry and therapy 7. Medication monitoring encounter  - COMPLETE METABOLIC PANEL WITH GFR  8. Gastroesophageal reflux disease without esophagitis Stable continue meds - famotidine (PEPCID) 20 MG tablet; Take 1 tablet (20 mg total) by mouth 2 (two) times daily.  Dispense: 180 tablet; Refill: 1

## 2018-12-11 DIAGNOSIS — Z008 Encounter for other general examination: Secondary | ICD-10-CM | POA: Diagnosis not present

## 2018-12-11 DIAGNOSIS — R69 Illness, unspecified: Secondary | ICD-10-CM | POA: Diagnosis not present

## 2018-12-11 DIAGNOSIS — F410 Panic disorder [episodic paroxysmal anxiety] without agoraphobia: Secondary | ICD-10-CM | POA: Diagnosis not present

## 2018-12-11 LAB — COMPLETE METABOLIC PANEL WITH GFR
AG Ratio: 1.5 (calc) (ref 1.0–2.5)
ALT: 9 U/L (ref 6–29)
AST: 13 U/L (ref 10–35)
Albumin: 4.5 g/dL (ref 3.6–5.1)
Alkaline phosphatase (APISO): 62 U/L (ref 37–153)
BUN/Creatinine Ratio: 14 (calc) (ref 6–22)
BUN: 17 mg/dL (ref 7–25)
CO2: 28 mmol/L (ref 20–32)
Calcium: 9.7 mg/dL (ref 8.6–10.4)
Chloride: 101 mmol/L (ref 98–110)
Creat: 1.24 mg/dL — ABNORMAL HIGH (ref 0.50–0.99)
GFR, Est African American: 52 mL/min/{1.73_m2} — ABNORMAL LOW (ref >=60)
GFR, Est Non African American: 45 mL/min/{1.73_m2} — ABNORMAL LOW (ref >=60)
Globulin: 3 g/dL (calc) (ref 1.9–3.7)
Glucose, Bld: 136 mg/dL — ABNORMAL HIGH (ref 65–99)
Potassium: 4 mmol/L (ref 3.5–5.3)
Sodium: 139 mmol/L (ref 135–146)
Total Bilirubin: 0.6 mg/dL (ref 0.2–1.2)
Total Protein: 7.5 g/dL (ref 6.1–8.1)

## 2018-12-11 LAB — LIPID PANEL
Cholesterol: 248 mg/dL — ABNORMAL HIGH (ref <200)
HDL: 49 mg/dL — ABNORMAL LOW (ref >=50)
LDL Cholesterol (Calc): 165 mg/dL (calc) — ABNORMAL HIGH
Non-HDL Cholesterol (Calc): 199 mg/dL (calc) — ABNORMAL HIGH (ref <130)
Total CHOL/HDL Ratio: 5.1 (calc) — ABNORMAL HIGH (ref <5.0)
Triglycerides: 181 mg/dL — ABNORMAL HIGH (ref <150)

## 2018-12-11 LAB — HEMOGLOBIN A1C
Hgb A1c MFr Bld: 6.1 % of total Hgb — ABNORMAL HIGH (ref <5.7)
Mean Plasma Glucose: 128 (calc)
eAG (mmol/L): 7.1 (calc)

## 2018-12-16 ENCOUNTER — Telehealth: Payer: Self-pay | Admitting: Family Medicine

## 2018-12-16 NOTE — Telephone Encounter (Signed)
Pt notified, will think about zetia

## 2018-12-16 NOTE — Telephone Encounter (Signed)
Patient is calling back for lab results. Please advise [phone]

## 2018-12-20 DIAGNOSIS — M5442 Lumbago with sciatica, left side: Secondary | ICD-10-CM | POA: Diagnosis not present

## 2018-12-20 DIAGNOSIS — M9903 Segmental and somatic dysfunction of lumbar region: Secondary | ICD-10-CM | POA: Diagnosis not present

## 2018-12-20 DIAGNOSIS — M25552 Pain in left hip: Secondary | ICD-10-CM | POA: Diagnosis not present

## 2018-12-20 DIAGNOSIS — M25562 Pain in left knee: Secondary | ICD-10-CM | POA: Diagnosis not present

## 2019-01-10 ENCOUNTER — Encounter: Payer: Self-pay | Admitting: Family Medicine

## 2019-01-10 ENCOUNTER — Ambulatory Visit (INDEPENDENT_AMBULATORY_CARE_PROVIDER_SITE_OTHER): Payer: Medicare HMO | Admitting: Family Medicine

## 2019-01-10 ENCOUNTER — Other Ambulatory Visit: Payer: Self-pay

## 2019-01-10 VITALS — BP 130/82 | HR 69 | Temp 97.6°F | Resp 16 | Ht 65.0 in | Wt 226.0 lb

## 2019-01-10 DIAGNOSIS — I10 Essential (primary) hypertension: Secondary | ICD-10-CM

## 2019-01-10 DIAGNOSIS — F419 Anxiety disorder, unspecified: Secondary | ICD-10-CM

## 2019-01-10 DIAGNOSIS — F329 Major depressive disorder, single episode, unspecified: Secondary | ICD-10-CM

## 2019-01-10 DIAGNOSIS — F429 Obsessive-compulsive disorder, unspecified: Secondary | ICD-10-CM | POA: Diagnosis not present

## 2019-01-10 DIAGNOSIS — R69 Illness, unspecified: Secondary | ICD-10-CM | POA: Diagnosis not present

## 2019-01-10 NOTE — Progress Notes (Signed)
Patient ID: Katherine Gould, female    DOB: 03-25-1951, 68 y.o.   MRN: [id number]  PCP: Delsa Grana, PA-C  Chief Complaint  Patient presents with  . Hypertension    1 month recheck    Subjective:   Katherine Gould is a 68 y.o. female, presents to clinic with CC of the following:  HPI   Here for BP recheck, has been elevated at past several visits, meds remain the same but she was encouraged to decrease salt in diet. Currently managed on - bisoprolol-hydrochlorothiazide (ZIAC) 10-6.25 MG tablet Pt reports good med compliance and denies any SE.   Blood pressure today is well controlled. BP Readings from Last 3 Encounters:  01/10/19 130/82  12/10/18 (!) 142/94  10/11/18 (!) 155/86   Pt denies CP, SOB, exertional sx, LE edema, palpitation, Ha's, visual disturbances, lightheadedness, hypotension, syncope. Dietary efforts for BP?  Still has salts in prepared foods, but tries to not add salt to food, but doesn't good at home. Le edema - baseline, she says she has "[employer] "always had a cough" not sure if its from her acid reflux or BP Meds - on zantac for years, but stopped it recently  She recently established L[place]   PHQ9 is also positive today - she has hx of OCD and seeing psychiatry and goes to therapy. Depression screen [facility] 2/9 01/10/2019 12/10/2018 04/15/2018  Decreased Interest 3 3 3   Down, Depressed, Hopeless 3 3 3   PHQ - 2 Score 6 6 6   Altered sleeping 3 3 3   Tired, decreased energy 3 3 3   Change in appetite 3 3 2   Feeling bad or failure about yourself  3 3 3   Trouble concentrating 3 3 3   Moving slowly or fidgety/restless 3 3 3   Suicidal thoughts 1 0 1  PHQ-9 Score 25 24 24   Difficult doing work/chores Extremely dIfficult Very difficult Extremely dIfficult   still doing therapy and psychiatry - managing meds Therapist has told her in recent visits (sees weekly) has recommended gabapentin.   Patient Active Problem List   Diagnosis Date Noted  . GERD  (gastroesophageal reflux disease) 12/10/2018  . Anxiety and depression 04/15/2018  . OCD (obsessive compulsive disorder) 04/15/2018  . Morbid obesity (Reform) 04/15/2018  . Medication monitoring encounter 04/15/2018  . Vitamin B12 deficiency 04/15/2018  . Vitamin D deficiency 04/15/2018  . Bilateral primary osteoarthritis of knee 04/15/2018  . Interstitial cystitis 04/15/2018  . OSA (obstructive sleep apnea) 07/14/2015  . Hypertension   . Hyperlipidemia 07/03/2012  . Prediabetes 07/03/2012      Current Outpatient Medications:  .  bisoprolol-hydrochlorothiazide (ZIAC) 10-6.25 MG tablet, Take 1 tablet by mouth daily., Disp: 90 tablet, Rfl: 1 .  Cholecalciferol (VITAMIN D3) 2000 UNITS capsule, Take 2,000 Units by mouth daily., Disp: , Rfl:  .  clonazePAM (KLONOPIN) 1 MG tablet, Take 1 mg by mouth 2 (two) times daily., Disp: , Rfl:  .  famotidine (PEPCID) 20 MG tablet, Take 1 tablet (20 mg total) by mouth 2 (two) times daily., Disp: 180 tablet, Rfl: 1 .  Turmeric 500 MG CAPS, Take by mouth., Disp: , Rfl:  .  venlafaxine (EFFEXOR) 75 MG tablet, Take 75 mg by mouth daily., Disp: , Rfl:  .  b complex vitamins tablet, Take 1 tablet by mouth daily., Disp: , Rfl:    Allergies  Allergen Reactions  . Aripiprazole Other (See Comments)    Incubus Dream tremmors   . Quetiapine Other (See Comments)  Incubus dream     Family History  Problem Relation Age of Onset  . Heart failure Mother   . Aortic stenosis Mother   . Hypertension Mother   . Stroke Mother   . Glaucoma Mother   . Heart attack Father   . Hypertension Father   . Heart attack Paternal Grandfather   . Glaucoma Sister   . Hypertension Sister   . Atrial fibrillation Sister   . Heart failure Sister   . Anxiety disorder Sister      Social History   Socioeconomic History  . Marital status: Divorced    Spouse name: Not on file  . Number of children: 1  . Years of education: 15  . Highest education level: Not on file   Occupational History  . Occupation: disability  Social Needs  . Financial resource strain: Not hard at all  . Food insecurity    Worry: Never true    Inability: Never true  . Transportation needs    Medical: No    Non-medical: No  Tobacco Use  . Smoking status: Never Smoker  . Smokeless tobacco: Never Used  Substance and Sexual Activity  . Alcohol use: No  . Drug use: No  . Sexual activity: Not Currently  Lifestyle  . Physical activity    Days per week: 0 days    Minutes per session: 0 min  . Stress: Not at all  Relationships  . Social Herbalist on phone: Once a week    Gets together: Once a week    Attends religious service: Never    Active member of club or organization: No    Attends meetings of clubs or organizations: Never    Relationship status: Divorced  . Intimate partner violence    Fear of current or ex partner: No    Emotionally abused: No    Physically abused: No    Forced sexual activity: No  Other Topics Concern  . Not on file  Social History Narrative  . Not on file    I personally reviewed active problem list, medication list, allergies, family history, social history, health maintenance, notes from last encounter, lab results with the patient/caregiver today.  Review of Systems  Constitutional: Negative.   HENT: Negative.   Eyes: Negative.   Respiratory: Negative.   Cardiovascular: Negative.   Gastrointestinal: Negative.   Endocrine: Negative.   Genitourinary: Negative.   Musculoskeletal: Negative.   Skin: Negative.   Allergic/Immunologic: Negative.   Neurological: Negative.   Hematological: Negative.   Psychiatric/Behavioral: Negative.   All other systems reviewed and are negative.      Objective:   Vitals:   01/10/19 1327  BP: 130/82  Pulse: 69  Resp: 16  Temp: 97.6 F (36.4 C)  TempSrc: Oral  SpO2: 97%  Weight: 226 lb (102.5 kg)  Height: 5\' 5"  (1.651 m)    Body mass index is 37.61 kg/m.  Physical Exam  Vitals signs and nursing note reviewed.  Constitutional:      General: She is not in acute distress.    Appearance: Normal appearance. She is well-developed. She is obese. She is not ill-appearing, toxic-appearing or diaphoretic.     Interventions: Face mask in place.  HENT:     Head: Normocephalic and atraumatic.     Right Ear: External ear normal.     Left Ear: External ear normal.  Eyes:     General: Lids are normal. No scleral icterus.  Right eye: No discharge.        Left eye: No discharge.     Conjunctiva/sclera: Conjunctivae normal.  Neck:     Musculoskeletal: Normal range of motion and neck supple.     Trachea: Phonation normal. No tracheal deviation.  Cardiovascular:     Rate and Rhythm: Normal rate and regular rhythm.     Pulses: Normal pulses.          Radial pulses are 2+ on the right side and 2+ on the left side.       Posterior tibial pulses are 2+ on the right side and 2+ on the left side.     Heart sounds: Normal heart sounds. No murmur. No friction rub. No gallop.   Pulmonary:     Effort: Pulmonary effort is normal. No respiratory distress.     Breath sounds: Normal breath sounds. No stridor. No wheezing, rhonchi or rales.  Chest:     Chest wall: No tenderness.  Abdominal:     General: Bowel sounds are normal. There is no distension.     Palpations: Abdomen is soft.     Tenderness: There is no abdominal tenderness. There is no guarding or rebound.  Musculoskeletal: Normal range of motion.        General: No deformity.     Right lower leg: No edema.     Left lower leg: No edema.  Lymphadenopathy:     Cervical: No cervical adenopathy.  Skin:    General: Skin is warm and dry.     Capillary Refill: Capillary refill takes less than 2 seconds.     Coloration: Skin is not jaundiced or pale.     Findings: No rash.  Neurological:     Mental Status: She is alert and oriented to person, place, and time.     Motor: No abnormal muscle tone.     Gait: Gait  normal.  Psychiatric:        Speech: Speech normal.        Behavior: Behavior normal.      Results for orders placed or performed in visit on 12/10/18  Lipid Profile  Result Value Ref Range   Cholesterol 248 (H) <200 mg/dL   HDL 49 (L) > OR = 50 mg/dL   Triglycerides 181 (H) <150 mg/dL   LDL Cholesterol (Calc) 165 (H) mg/dL (calc)   Total CHOL/HDL Ratio 5.1 (H) <5.0 (calc)   Non-HDL Cholesterol (Calc) 199 (H) <130 mg/dL (calc)  COMPLETE METABOLIC PANEL WITH GFR  Result Value Ref Range   Glucose, Bld 136 (H) 65 - 99 mg/dL   BUN 17 7 - 25 mg/dL   Creat 1.24 (H) 0.50 - 0.99 mg/dL   GFR, Est Non African American 45 (L) > OR = 60 mL/min/1.67m2   GFR, Est African American 52 (L) > OR = 60 mL/min/1.77m2   BUN/Creatinine Ratio 14 6 - 22 (calc)   Sodium 139 135 - 146 mmol/L   Potassium 4.0 3.5 - 5.3 mmol/L   Chloride 101 98 - 110 mmol/L   CO2 28 20 - 32 mmol/L   Calcium 9.7 8.6 - 10.4 mg/dL   Total Protein 7.5 6.1 - 8.1 g/dL   Albumin 4.5 3.6 - 5.1 g/dL   Globulin 3.0 1.9 - 3.7 g/dL (calc)   AG Ratio 1.5 1.0 - 2.5 (calc)   Total Bilirubin 0.6 0.2 - 1.2 mg/dL   Alkaline phosphatase (APISO) 62 37 - 153 U/L   AST 13 10 - 35  U/L   ALT 9 6 - 29 U/L  HgB A1c  Result Value Ref Range   Hgb A1c MFr Bld 6.1 (H) <5.7 % of total Hgb   Mean Plasma Glucose 128 (calc)   eAG (mmol/L) 7.1 (calc)        Assessment & Plan:      ICD-10-CM   1. Essential hypertension  I10    BP improved and well controlled today, continue meds and diet and exercise efforts  2. Obsessive-compulsive disorder, unspecified type  F42.9    per therapist and psychiatry  3. Anxiety and depression  F41.9    F32.9    Per therapist and psychiatry        Delsa Grana, PA-C 01/10/19 1:34 PM

## 2019-01-10 NOTE — Patient Instructions (Signed)
Continue your blood pressure medicine - great job!  Its is much improved. Continue low salt diet. We will see you for routine follow up in 3 months Come in sooner if BP is <100/60 >160/100, or you have any symptoms or concerns.   Managing Your Hypertension Hypertension is commonly called high blood pressure. This is when the force of your blood pressing against the walls of your arteries is too strong. Arteries are blood vessels that carry blood from your heart throughout your body. Hypertension forces the heart to work harder to pump blood, and may cause the arteries to become narrow or stiff. Having untreated or uncontrolled hypertension can cause heart attack, stroke, kidney disease, and other problems. What are blood pressure readings? A blood pressure reading consists of a higher number over a lower number. Ideally, your blood pressure should be below 120/80. The first ("top") number is called the systolic pressure. It is a measure of the pressure in your arteries as your heart beats. The second ("bottom") number is called the diastolic pressure. It is a measure of the pressure in your arteries as the heart relaxes. What does my blood pressure reading mean? Blood pressure is classified into four stages. Based on your blood pressure reading, your health care provider may use the following stages to determine what type of treatment you need, if any. Systolic pressure and diastolic pressure are measured in a unit called mm Hg. Normal  Systolic pressure: below 123456.  Diastolic pressure: below 80. Elevated  Systolic pressure: [id number].  Diastolic pressure: below 80. Hypertension stage 1  Systolic pressure: 0000000.  Diastolic pressure: [id number]. Hypertension stage 2  Systolic pressure: [id number] or above.  Diastolic pressure: 90 or above. What health risks are associated with hypertension? Managing your hypertension is an important responsibility. Uncontrolled hypertension can lead to:  A  heart attack.  A stroke.  A weakened blood vessel (aneurysm).  Heart failure.  Kidney damage.  Eye damage.  Metabolic syndrome.  Memory and concentration problems. What changes can I make to manage my hypertension? Hypertension can be managed by making lifestyle changes and possibly by taking medicines. Your health care provider will help you make a plan to bring your blood pressure within a normal range. Eating and drinking   Eat a diet that is high in fiber and potassium, and low in salt (sodium), added sugar, and fat. An example eating plan is called the DASH (Dietary Approaches to Stop Hypertension) diet. To eat this way: ? Eat plenty of fresh fruits and vegetables. Try to fill half of your plate at each meal with fruits and vegetables. ? Eat whole grains, such as whole wheat pasta, brown rice, or whole grain bread. Fill about one quarter of your plate with whole grains. ? Eat low-fat diary products. ? Avoid fatty cuts of meat, processed or cured meats, and poultry with skin. Fill about one quarter of your plate with lean proteins such as fish, chicken without skin, beans, eggs, and tofu. ? Avoid premade and processed foods. These tend to be higher in sodium, added sugar, and fat.  Reduce your daily sodium intake. Most people with hypertension should eat less than 1,500 mg of sodium a day.  Limit alcohol intake to no more than 1 drink a day for nonpregnant women and 2 drinks a day for men. One drink equals 12 oz of beer, 5 oz of wine, or 1 oz of hard liquor. Lifestyle  Work with your health care provider to maintain a healthy body  weight, or to lose weight. Ask what an ideal weight is for you.  Get at least 30 minutes of exercise that causes your heart to beat faster (aerobic exercise) most days of the week. Activities may include walking, swimming, or biking.  Include exercise to strengthen your muscles (resistance exercise), such as weight lifting, as part of your weekly  exercise routine. Try to do these types of exercises for 30 minutes at least 3 days a week.  Do not use any products that contain nicotine or tobacco, such as cigarettes and e-cigarettes. If you need help quitting, ask your health care provider.  Control any long-term (chronic) conditions you have, such as high cholesterol or diabetes. Monitoring  Monitor your blood pressure at home as told by your health care provider. Your personal target blood pressure may vary depending on your medical conditions, your age, and other factors.  Have your blood pressure checked regularly, as often as told by your health care provider. Working with your health care provider  Review all the medicines you take with your health care provider because there may be side effects or interactions.  Talk with your health care provider about your diet, exercise habits, and other lifestyle factors that may be contributing to hypertension.  Visit your health care provider regularly. Your health care provider can help you create and adjust your plan for managing hypertension. Will I need medicine to control my blood pressure? Your health care provider may prescribe medicine if lifestyle changes are not enough to get your blood pressure under control, and if:  Your systolic blood pressure is 130 or higher.  Your diastolic blood pressure is 80 or higher. Take medicines only as told by your health care provider. Follow the directions carefully. Blood pressure medicines must be taken as prescribed. The medicine does not work as well when you skip doses. Skipping doses also puts you at risk for problems. Contact a health care provider if:  You think you are having a reaction to medicines you have taken.  You have repeated (recurrent) headaches.  You feel dizzy.  You have swelling in your ankles.  You have trouble with your vision. Get help right away if:  You develop a severe headache or confusion.  You have  unusual weakness or numbness, or you feel faint.  You have severe pain in your chest or abdomen.  You vomit repeatedly.  You have trouble breathing. Summary  Hypertension is when the force of blood pumping through your arteries is too strong. If this condition is not controlled, it may put you at risk for serious complications.  Your personal target blood pressure may vary depending on your medical conditions, your age, and other factors. For most people, a normal blood pressure is less than 120/80.  Hypertension is managed by lifestyle changes, medicines, or both. Lifestyle changes include weight loss, eating a healthy, low-sodium diet, exercising more, and limiting alcohol. This information is not intended to replace advice given to you by your health care provider. Make sure you discuss any questions you have with your health care provider. Document Released: 12/27/2011 Document Revised: 07/26/2018 Document Reviewed: 03/01/2016 Elsevier Patient Education  [place].  Here are some resources to help you if you feel you are in a mental health crisis:  [place] - Call [phone]  for help - Website with more resources: GripTrip.com.pt  [employer] - Call 443-545-3628 for help. - Mobile Crisis Program available 24 hours a day, 365  days a year. - Available for anyone of any age in [place] counties.  [facility] - Address: [address], [place] - Telephone: [phone]  - Hours of Operation: Sunday - Saturday - 8:00 a.m. - 8:00 p.m. - Medicaid, Medicare (Government Issued Only), BCBS, and [place] [employer], [place], Psychiatrists on-site to provide medication management, [place], and Peer Support Care.  National Mobile Crisis: [phone] - [place] available 24 hours a day, 365  days a year. - Available for anyone of any age in [place]

## 2019-02-11 DIAGNOSIS — R69 Illness, unspecified: Secondary | ICD-10-CM | POA: Diagnosis not present

## 2019-02-12 DIAGNOSIS — F411 Generalized anxiety disorder: Secondary | ICD-10-CM | POA: Diagnosis not present

## 2019-02-12 DIAGNOSIS — R69 Illness, unspecified: Secondary | ICD-10-CM | POA: Diagnosis not present

## 2019-02-21 DIAGNOSIS — M9903 Segmental and somatic dysfunction of lumbar region: Secondary | ICD-10-CM | POA: Diagnosis not present

## 2019-02-21 DIAGNOSIS — M5442 Lumbago with sciatica, left side: Secondary | ICD-10-CM | POA: Diagnosis not present

## 2019-02-21 DIAGNOSIS — M25552 Pain in left hip: Secondary | ICD-10-CM | POA: Diagnosis not present

## 2019-02-21 DIAGNOSIS — M25562 Pain in left knee: Secondary | ICD-10-CM | POA: Diagnosis not present

## 2019-03-05 DIAGNOSIS — R69 Illness, unspecified: Secondary | ICD-10-CM | POA: Diagnosis not present

## 2019-03-05 DIAGNOSIS — F410 Panic disorder [episodic paroxysmal anxiety] without agoraphobia: Secondary | ICD-10-CM | POA: Diagnosis not present

## 2019-03-11 ENCOUNTER — Ambulatory Visit (INDEPENDENT_AMBULATORY_CARE_PROVIDER_SITE_OTHER)
Admit: 2019-03-11 | Discharge: 2019-03-11 | Disposition: A | Discharge status: Home / Self Care | Payer: Medicare HMO | Attending: Family Medicine | Admitting: Family Medicine

## 2019-03-11 ENCOUNTER — Ambulatory Visit: Payer: Self-pay

## 2019-03-11 ENCOUNTER — Ambulatory Visit
Admission: EM | Admit: 2019-03-11 | Discharge: 2019-03-11 | Disposition: A | Discharge status: Home / Self Care | Payer: Medicare HMO | Attending: Family Medicine | Admitting: Family Medicine

## 2019-03-11 ENCOUNTER — Encounter: Payer: Self-pay | Admitting: Emergency Medicine

## 2019-03-11 ENCOUNTER — Other Ambulatory Visit: Payer: Self-pay

## 2019-03-11 DIAGNOSIS — R350 Frequency of micturition: Secondary | ICD-10-CM

## 2019-03-11 DIAGNOSIS — R319 Hematuria, unspecified: Secondary | ICD-10-CM | POA: Diagnosis not present

## 2019-03-11 DIAGNOSIS — N189 Chronic kidney disease, unspecified: Secondary | ICD-10-CM

## 2019-03-11 DIAGNOSIS — R300 Dysuria: Secondary | ICD-10-CM

## 2019-03-11 DIAGNOSIS — R310 Gross hematuria: Secondary | ICD-10-CM

## 2019-03-11 DIAGNOSIS — D3502 Benign neoplasm of left adrenal gland: Secondary | ICD-10-CM | POA: Diagnosis not present

## 2019-03-11 LAB — URINALYSIS, COMPLETE (UACMP) WITH MICROSCOPIC
Bilirubin Urine: NEGATIVE
Glucose, UA: NEGATIVE mg/dL
Ketones, ur: NEGATIVE mg/dL
Nitrite: NEGATIVE
Protein, ur: 100 mg/dL — AB
RBC / HPF: >50 RBC/hpf (ref 0–5)
Specific Gravity, Urine: 1.025 (ref 1.005–1.030)
pH: 7 (ref 5.0–8.0)

## 2019-03-11 MED ORDER — SULFAMETHOXAZOLE-TRIMETHOPRIM 800-160 MG PO TABS: 1.0000 | ORAL_TABLET | Freq: Two times a day (BID) | ORAL | 0 refills | Status: AC

## 2019-03-11 NOTE — ED Provider Notes (Signed)
[facility]    CSN: [id number] Arrival date & time: 03/11/19  1242      History   Chief Complaint Chief Complaint  Patient presents with   Urinary Frequency   Dysuria    HPI Katherine Gould is a 68 y.o. female presenting for onset of dysuria, urinary frequency, urgency and gross hematuria this morning. She has also has lower back pain, but admits to chronic lower back pain. Patient denies fever. Has been fatigued. She denies abdominal pain, chills/sweats, n/v/d. Patient has history of interstitial cystitis. She also has history of chronic kidney disease. Patient has taken Tylenol for pain relief w/o much improvement. She is concerned for possible urinary tract infection or kidney stone. Denies vaginal pain/discharge, vaginal bleeding, or irritation. She has no other symptoms to report.   HPI  Past Medical History:  Diagnosis Date   Cervical polyp    Dysthymic disorder    Esophageal reflux    Hyperlipidemia    Hypertension    Obsessive-compulsive disorders    Other abnormal glucose    Other abnormal glucose    Other B-complex deficiencies     Patient Active Problem List   Diagnosis Date Noted   GERD (gastroesophageal reflux disease) 12/10/2018   Anxiety and depression 04/15/2018   OCD (obsessive compulsive disorder) 04/15/2018   Morbid obesity ([place]) 04/15/2018   Medication monitoring encounter 04/15/2018   Vitamin B12 deficiency 04/15/2018   Vitamin D deficiency 04/15/2018   Bilateral primary osteoarthritis of knee 04/15/2018   Interstitial cystitis 04/15/2018   OSA (obstructive sleep apnea) 07/14/2015   Hypertension    Hyperlipidemia 07/03/2012   Prediabetes 07/03/2012    Past Surgical History:  Procedure Laterality Date   LASER ABLATION CONDYLOMA CERVICAL / VULVAR      OB History   No obstetric history on file.      Home Medications    Prior to Admission medications   Medication Sig Start Date End Date Taking?  Authorizing Provider  b complex vitamins tablet Take 1 tablet by mouth daily.   Yes [provider]  bisoprolol-hydrochlorothiazide (ZIAC) 10-6.25 MG tablet Take 1 tablet by mouth daily. 12/10/18  Yes Poulose, Bethel Born, NP  Cholecalciferol (VITAMIN D3) 2000 UNITS capsule Take 2,000 Units by mouth daily.   Yes [provider]  clonazePAM (KLONOPIN) 1 MG tablet Take 1 mg by mouth 2 (two) times daily.   Yes [provider]  famotidine (PEPCID) 20 MG tablet Take 1 tablet (20 mg total) by mouth 2 (two) times daily. 12/10/18  Yes Poulose, Bethel Born, NP  Turmeric 500 MG CAPS Take by mouth.   Yes [provider]  sulfamethoxazole-trimethoprim (BACTRIM DS) 800-160 MG tablet Take 1 tablet by mouth 2 (two) times daily for 7 days. 03/11/19 03/18/19  Danton Clap, PA-C  venlafaxine (EFFEXOR) 75 MG tablet Take 75 mg by mouth daily.    [provider]    Family History Family History  Problem Relation Age of Onset   Heart failure Mother    Aortic stenosis Mother    Hypertension Mother    Stroke Mother    Glaucoma Mother    Heart attack Father    Hypertension Father    Heart attack Paternal Grandfather    Glaucoma Sister    Hypertension Sister    Atrial fibrillation Sister    Heart failure Sister    Anxiety disorder Sister     Social History Social History   Tobacco Use   Smoking status:  Never Smoker   Smokeless tobacco: Never Used  Substance Use Topics   Alcohol use: No   Drug use: No     Allergies   Aripiprazole and Quetiapine   Review of Systems Review of Systems  Constitutional: Positive for fatigue. Negative for appetite change and fever.  Gastrointestinal: Negative for abdominal pain, nausea and vomiting.  Genitourinary: Positive for difficulty urinating, dysuria, frequency, hematuria and urgency. Negative for flank pain, vaginal bleeding, vaginal discharge and vaginal pain.  Musculoskeletal: Positive for  arthralgias and back pain.  Neurological: Negative for dizziness and weakness.     Physical Exam Triage Vital Signs ED Triage Vitals  Enc Vitals Group     BP 03/11/19 1253 (!) 164/86     Pulse Rate 03/11/19 1253 62     Resp 03/11/19 1253 18     Temp 03/11/19 1253 98 F (36.7 C)     Temp src --      SpO2 03/11/19 1253 99 %     Weight 03/11/19 1256 228 lb (103.4 kg)     Height 03/11/19 1256 5\' 5"  (1.651 m)     Head Circumference --      Peak Flow --      Pain Score 03/11/19 1255 4     Pain Loc --      Pain Edu? --      Excl. in [place]? --    No data found.  Updated Vital Signs BP (!) 164/86 (BP Location: Right Arm)    Pulse 62    Temp 98 F (36.7 C)    Resp 18    Ht 5\' 5"  (1.651 m)    Wt 228 lb (103.4 kg)    SpO2 99%    BMI 37.94 kg/m       Physical Exam Vitals signs and nursing note reviewed.  Constitutional:      General: She is not in acute distress.    Appearance: She is obese. She is not ill-appearing, toxic-appearing or diaphoretic.  HENT:     Head: Normocephalic and atraumatic.  Eyes:     General: No scleral icterus.       Right eye: No discharge.        Left eye: No discharge.     Conjunctiva/sclera: Conjunctivae normal.  Cardiovascular:     Rate and Rhythm: Normal rate and regular rhythm.     Heart sounds: No murmur.  Pulmonary:     Effort: Pulmonary effort is normal. No respiratory distress.     Breath sounds: Normal breath sounds. No wheezing.  Abdominal:     Palpations: Abdomen is soft.     Tenderness: There is no abdominal tenderness. There is no right CVA tenderness, left CVA tenderness or guarding.  Skin:    General: Skin is warm.     Findings: No rash.  Neurological:     General: No focal deficit present.     Mental Status: She is alert. Mental status is at baseline.     Motor: No weakness.     Gait: Gait normal.  Psychiatric:        Mood and Affect: Mood normal.        Behavior: Behavior normal.        Thought Content: Thought content  normal.      UC Treatments / Results  Labs (all labs ordered are listed, but only abnormal results are displayed) Labs Reviewed  URINALYSIS, COMPLETE (UACMP) WITH MICROSCOPIC - Abnormal; Notable for the following components:  Result Value   Color, Urine RED (*)    APPearance CLOUDY (*)    Hgb urine dipstick LARGE (*)    Protein, ur 100 (*)    Leukocytes,Ua SMALL (*)    Bacteria, UA FEW (*)    All other components within normal limits  URINE CULTURE    EKG   Radiology Ct Renal Stone Study  Result Date: 03/11/2019 CLINICAL DATA:  Dysuria and urinary frequency. Hematuria. Left flank pain EXAM: CT ABDOMEN AND PELVIS WITHOUT CONTRAST TECHNIQUE: Multidetector CT imaging of the abdomen and pelvis was performed following the standard protocol without oral or IV contrast. COMPARISON:  None. FINDINGS: Lower chest: There is mild atelectatic change in the posterior right base. Lung bases otherwise are clear. There are foci of coronary artery calcification. Hepatobiliary: There is hepatic steatosis. No focal liver lesions are evident on this noncontrast enhanced study. Gallbladder wall is not appreciably thickened. There is no biliary duct dilatation. Pancreas: There is no pancreatic mass or inflammatory focus. Spleen: No splenic lesions are evident. Adrenals/Urinary Tract: Right adrenal appears normal. There is a 1.3 x 1.0 cm left adrenal adenoma. There is no evident renal mass or hydronephrosis on either side. There is no appreciable renal or ureteral calculus on either side. Urinary bladder is midline with wall thickness within normal limits. Stomach/Bowel: There is moderate stool throughout colon. There is no appreciable bowel wall or mesenteric thickening. There is no evident bowel obstruction. The terminal ileum appears normal. There is no evident free air or portal venous air. Vascular/Lymphatic: There is no abdominal aortic aneurysm. There are foci of aortic and iliac artery  atherosclerotic calcification. There is no evident adenopathy in the abdomen or pelvis by size criteria. Occasional subcentimeter mesenteric lymph nodes are considered nonspecific. Reproductive: Uterus is anteverted. A small calcification along the periphery of the uterus likely represents a small uterine leiomyoma. Beyond this probable small intrauterine leiomyoma, no pelvic mass lesions are evident on this noncontrast enhanced study. Other: Appendix appears normal. There is no evident abscess or ascites in the abdomen or pelvis. There is a small amount of fat in the umbilicus. Musculoskeletal: There is degenerative change in the lower thoracic and lumbar regions. There is no blastic or lytic bone lesion. There is no intramuscular or abdominal wall lesions. IMPRESSION: 1. No appreciable renal or ureteral calculus. No hydronephrosis. Urinary bladder wall thickness normal. A cause for patient's symptoms has not been established with this study. 2.  1.3 x 1.0 cm left adrenal adenoma. 3. Aortic Atherosclerosis (ICD10-I70.0). There is also iliac artery and coronary artery calcification. 4. No bowel obstruction. No abscess in the abdomen or pelvis. Appendix appears normal. 5. Small intrauterine calcification, a likely subcentimeter leiomyoma. Electronically Signed   By: Lowella Grip III M.D.   On: 03/11/2019 15:15    Procedures Procedures (including critical care time)  Medications Ordered in UC Medications - No data to display  Initial Impression / Assessment and Plan / UC Course  I have reviewed the triage vital signs and the nursing notes.  Pertinent labs & imaging results that were available during my care of the patient were reviewed by me and considered in my medical decision making (see chart for details).   Patient presents with complaints of dysuria, hematuria, frequency and urgency. Concerns presented for UTI and kidney stone. UA shows gross blood in urine, protein and trace WBCs. CT scan  obtained due to back pain and hematuria. CT scan unrevealing as to cause for hematuria. No stones seen.  Advised patient blood could be due to a UTI so we will treat for that at this time with Bactrim DS. Culture will be sent out to check for bacteria and she will be called for results. Advised rest and fluids and if symptoms due to UTI she should feel much better in a couple of days. Patient also has interstitial cystitis and Stage 3 CKD. Advised her to follow up with urologist--make appointment. Patient education given about CKD. She says she was unaware she had CKD. GFR from 12/10/2018 indicates value of 45. Advised patient to follow up with PCP and request nephrology referral for more information and education about her renal disease. Advised avoidance of NSAIDs and importance of BP control. Advised her to call PCP to make a follow up but return to our office if any new or worsening symptoms.    Final Clinical Impressions(s) / UC Diagnoses   Final diagnoses:  Dysuria  Urinary frequency  Gross hematuria  Chronic kidney disease, unspecified CKD stage     Discharge Instructions     HEMATURIA/URINARY FREQUENCY AND URGENCY: the urinalysis shows a lot of blood and protein. There is trace WBCs which could indicate possible UTI. CT scan of the abdomen and pelvic was performed today to assess for possible kidney stone. There were no kidney stones found. We will send the urine out for a culture to look for bacteria further and given symptoms will treat you for suspected urinary tract infection at this time. Take tylenol as needed for pain relief but pain should be pretty much resolved in 2-3 day after starting antibiotics. If it is not or pain worsens, you should go to the ER. Increase rest and fluids at this time. Call to make a follow up with your PCP and discuss your CT results further. If you continue to have blood in the urine, you may need to see a urologist to assess your bladder function further and  possible do a procedure to look inside the bladder for inflammation, bleeding, etc. If you develop fever, weakness, increased pain, or continued bleeding after 3 days, seek re-examination. Otherwise follow up with you PCP as soon as you can!     ED Prescriptions    Medication Sig Dispense Auth. Provider   sulfamethoxazole-trimethoprim (BACTRIM DS) 800-160 MG tablet Take 1 tablet by mouth 2 (two) times daily for 7 days. 14 tablet Gretta Cool     PDMP not reviewed this encounter.   Danton Clap, PA-C 03/11/19 2053

## 2019-03-11 NOTE — Discharge Instructions (Addendum)
HEMATURIA/URINARY FREQUENCY AND URGENCY: the urinalysis shows a lot of blood and protein. There is trace WBCs which could indicate possible UTI. CT scan of the abdomen and pelvic was performed today to assess for possible kidney stone. There were no kidney stones found. We will send the urine out for a culture to look for bacteria further and given symptoms will treat you for suspected urinary tract infection at this time. Take tylenol as needed for pain relief but pain should be pretty much resolved in 2-3 day after starting antibiotics. If it is not or pain worsens, you should go to the ER. Increase rest and fluids at this time. Call to make a follow up with your PCP and discuss your CT results further. If you continue to have blood in the urine, you may need to see a urologist to assess your bladder function further and possible do a procedure to look inside the bladder for inflammation, bleeding, etc. If you develop fever, weakness, increased pain, or continued bleeding after 3 days, seek re-examination. Otherwise follow up with you PCP as soon as you can!

## 2019-03-11 NOTE — Telephone Encounter (Signed)
New onset urgency and burning with urination. Pt stated she is seeing pink tinged urine. Pt stated she had such urgency that she voided on herself. No fever or flank pain.  Care advice given and pt verbalized understanding. No appts available with PCP office- advised pt to go to [facility]. Pt verbalized understanding  Reason for Disposition . Urinating more frequently than usual (i.e., frequency)  Answer Assessment - Initial Assessment Questions 1. SYMPTOM: "What's the main symptom you're concerned about?" (e.g., frequency, incontinence)     Frequency, pink tinged, burning 2. ONSET: "When did the  symptoms  start?"     This am 3. PAIN: "Is there any pain?" If so, ask: "How bad is it?" (Scale: 1-10; mild, moderate, severe)     Yes  Moderate  4. CAUSE: "What do you think is causing the symptoms?"     UTI 5. OTHER SYMPTOMS: "Do you have any other symptoms?" (e.g., fever, flank pain, blood in urine, pain with urination)     Blood in urine and pain with urination 6. PREGNANCY: "Is there any chance you are pregnant?" "When was your last menstrual period?"     n/a  Protocols used: URINARY [facility]

## 2019-03-11 NOTE — ED Triage Notes (Signed)
Patient c/o an episode of urinary incontinence this morning. She is c/o urinary frequency and dysuria that started this morning.

## 2019-03-11 NOTE — ED Notes (Signed)
CT auth approved Auth # [id number] Case [id number]

## 2019-03-13 LAB — URINE CULTURE: Culture: 90000 — AB

## 2019-03-17 ENCOUNTER — Telehealth (HOSPITAL_COMMUNITY): Payer: Self-pay | Admitting: Emergency Medicine

## 2019-03-17 NOTE — Telephone Encounter (Signed)
Urine culture was positive for ENTEROBACTER CLOACAE and was given bactrim  at urgent care visit. Attempted to reach patient. No answer at this time.

## 2019-03-18 DIAGNOSIS — M1712 Unilateral primary osteoarthritis, left knee: Secondary | ICD-10-CM | POA: Diagnosis not present

## 2019-03-18 DIAGNOSIS — M25562 Pain in left knee: Secondary | ICD-10-CM | POA: Diagnosis not present

## 2019-03-24 ENCOUNTER — Ambulatory Visit (INDEPENDENT_AMBULATORY_CARE_PROVIDER_SITE_OTHER): Payer: Medicare HMO | Admitting: Family Medicine

## 2019-03-24 ENCOUNTER — Other Ambulatory Visit: Payer: Self-pay

## 2019-03-24 ENCOUNTER — Ambulatory Visit: Payer: Self-pay | Admitting: Family Medicine

## 2019-03-24 ENCOUNTER — Encounter: Payer: Self-pay | Admitting: Family Medicine

## 2019-03-24 VITALS — BP 146/88 | HR 69 | Temp 97.8°F | Ht 65.0 in | Wt 221.7 lb

## 2019-03-24 DIAGNOSIS — R319 Hematuria, unspecified: Secondary | ICD-10-CM | POA: Diagnosis not present

## 2019-03-24 DIAGNOSIS — Z5181 Encounter for therapeutic drug level monitoring: Secondary | ICD-10-CM

## 2019-03-24 DIAGNOSIS — I10 Essential (primary) hypertension: Secondary | ICD-10-CM | POA: Diagnosis not present

## 2019-03-24 DIAGNOSIS — E782 Mixed hyperlipidemia: Secondary | ICD-10-CM | POA: Diagnosis not present

## 2019-03-24 DIAGNOSIS — Z23 Encounter for immunization: Secondary | ICD-10-CM | POA: Diagnosis not present

## 2019-03-24 DIAGNOSIS — N390 Urinary tract infection, site not specified: Secondary | ICD-10-CM

## 2019-03-24 DIAGNOSIS — R7303 Prediabetes: Secondary | ICD-10-CM | POA: Diagnosis not present

## 2019-03-24 DIAGNOSIS — N1831 Chronic kidney disease, stage 3a: Secondary | ICD-10-CM

## 2019-03-24 DIAGNOSIS — Z1159 Encounter for screening for other viral diseases: Secondary | ICD-10-CM

## 2019-03-24 LAB — POCT URINALYSIS DIPSTICK
Bilirubin, UA: NEGATIVE
Blood, UA: NEGATIVE
Glucose, UA: NEGATIVE
Ketones, UA: NEGATIVE
Leukocytes, UA: NEGATIVE
Nitrite, UA: NEGATIVE
Protein, UA: NEGATIVE
Spec Grav, UA: 1.02 (ref 1.010–1.025)
Urobilinogen, UA: 0.2 E.U./dL
pH, UA: 6.5 (ref 5.0–8.0)

## 2019-03-24 NOTE — Progress Notes (Signed)
Patient ID: Katherine Gould, female    DOB: 1951/04/14, 68 y.o.   MRN: [id number]  PCP: Delsa Grana, PA-C  Chief Complaint  Patient presents with  . Follow-up  . Urinary Tract Infection    went to urgent care was given antibiotic, dx with CKD     Subjective:   Katherine Gould is a 68 y.o. female, presents to clinic with CC of the following:  HPI   F/up UTI -she went to urgent care on 03/11/2019 was diagnosed with UTI, she was treated with Bactrim for 7 days, she feels that her urinary symptoms have mostly resolved but she would like to" make sure that it is gone".  The culture was reviewed and was positive for Enterobacter cloacae and sensitive to bactrim.  Patient would like to have her urine retested today.  F/up HTN and CKD Her blood pressure is treated with with bisoprolol hydrochlorothiazide10-6.25 daily she states she is compliant with this medication, she has no side effects or concerns.  She is trying to do no additional salt in her diet.  She denies any chest pain, shortness of breath, lower extremity edema, near syncope, lightheadedness, headaches, palpitations (sometimes only associated with anxiety but otherwise none). On her chart there is also a diagnosis of chronic kidney disease -reviewing her chart her last labs were December 2019 and August 2020 showing worsening renal function sCr was 1.08 up to 1.24, and GFR 53 worsened to 45.   BP Readings from Last 3 Encounters:  03/24/19 (!) 146/88  03/11/19 (!) 164/86  01/10/19 130/82  Her last several blood pressures have been elevated. Lab Results  Component Value Date   CREATININE 1.24 (H) 12/10/2018   Lab Results  Component Value Date   BUN 17 12/10/2018   Lab Results  Component Value Date   GFRNONAA 45 (L) 12/10/2018   Patient also has a diagnosis of hyperlipidemia and prediabetes on her chart but not on any medication for either of these diagnoses will recheck today chemistry, liver function, cholesterol panel and  if fasting blood sugar is elevated will add on an A1c.  Patient has previously left a urine sample  Results for orders placed or performed in visit on 03/24/19  POCT Urinalysis Dipstick  Result Value Ref Range   Color, UA dark yellow    Clarity, UA clear    Glucose, UA Negative Negative   Bilirubin, UA neg    Ketones, UA neg    Spec Grav, UA 1.020 1.010 - 1.025   Blood, UA neg    pH, UA 6.5 5.0 - 8.0   Protein, UA Negative Negative   Urobilinogen, UA 0.2 0.2 or 1.0 E.U./dL   Nitrite, UA neg    Leukocytes, UA Negative Negative   Appearance clear    Odor foul       Patient Active Problem List   Diagnosis Date Noted  . GERD (gastroesophageal reflux disease) 12/10/2018  . Anxiety and depression 04/15/2018  . OCD (obsessive compulsive disorder) 04/15/2018  . Morbid obesity ([place]) 04/15/2018  . Medication monitoring encounter 04/15/2018  . Vitamin B12 deficiency 04/15/2018  . Vitamin D deficiency 04/15/2018  . Bilateral primary osteoarthritis of knee 04/15/2018  . Interstitial cystitis 04/15/2018  . OSA (obstructive sleep apnea) 07/14/2015  . Hypertension   . Hyperlipidemia 07/03/2012  . Prediabetes 07/03/2012      Current Outpatient Medications:  .  bisoprolol-hydrochlorothiazide (ZIAC) 10-6.25 MG tablet, Take 1 tablet by mouth daily., Disp: 90 tablet, Rfl: 1 .  Cholecalciferol (VITAMIN D3) 2000 UNITS capsule, Take 2,000 Units by mouth daily., Disp: , Rfl:  .  clonazePAM (KLONOPIN) 1 MG tablet, Take 1 mg by mouth 2 (two) times daily., Disp: , Rfl:  .  famotidine (PEPCID) 20 MG tablet, Take 1 tablet (20 mg total) by mouth 2 (two) times daily., Disp: 180 tablet, Rfl: 1 .  FLUoxetine (PROZAC) 20 MG tablet, Take 20 mg by mouth daily., Disp: , Rfl:  .  Turmeric 500 MG CAPS, Take by mouth., Disp: , Rfl:    Allergies  Allergen Reactions  . Aripiprazole Other (See Comments)    Incubus Dream tremmors   . Quetiapine Other (See Comments)    Incubus dream     Family  History  Problem Relation Age of Onset  . Heart failure Mother   . Aortic stenosis Mother   . Hypertension Mother   . Stroke Mother   . Glaucoma Mother   . Heart attack Father   . Hypertension Father   . Heart attack Paternal Grandfather   . Glaucoma Sister   . Hypertension Sister   . Atrial fibrillation Sister   . Heart failure Sister   . Anxiety disorder Sister      Social History   Socioeconomic History  . Marital status: Divorced    Spouse name: Not on file  . Number of children: 1  . Years of education: 7  . Highest education level: Not on file  Occupational History  . Occupation: disability  Social Needs  . Financial resource strain: Not hard at all  . Food insecurity    Worry: Never true    Inability: Never true  . Transportation needs    Medical: No    Non-medical: No  Tobacco Use  . Smoking status: Never Smoker  . Smokeless tobacco: Never Used  Substance and Sexual Activity  . Alcohol use: No  . Drug use: No  . Sexual activity: Not Currently  Lifestyle  . Physical activity    Days per week: 0 days    Minutes per session: 0 min  . Stress: Not at all  Relationships  . Social Herbalist on phone: Once a week    Gets together: Once a week    Attends religious service: Never    Active member of club or organization: No    Attends meetings of clubs or organizations: Never    Relationship status: Divorced  . Intimate partner violence    Fear of current or ex partner: No    Emotionally abused: No    Physically abused: No    Forced sexual activity: No  Other Topics Concern  . Not on file  Social History Narrative  . Not on file    I personally reviewed active problem list, medication list, allergies, family history, social history, health maintenance, notes from last encounter, lab results, imaging with the patient/caregiver today.   Review of Systems  Constitutional: Negative.   HENT: Negative.   Eyes: Negative.   Respiratory:  Negative.   Cardiovascular: Negative.   Gastrointestinal: Negative.   Endocrine: Negative.   Genitourinary: Negative.   Musculoskeletal: Negative.   Skin: Negative.   Allergic/Immunologic: Negative.   Neurological: Negative.   Hematological: Negative.   Psychiatric/Behavioral: Negative.   All other systems reviewed and are negative.      Objective:   Vitals:   03/24/19 0809  BP: (!) 146/88  Pulse: 69  Temp: 97.8 F (36.6 C)  Weight: 221 lb 11.2  oz (100.6 kg)  Height: 5\' 5"  (1.651 m)    Body mass index is 36.89 kg/m.  Physical Exam Vitals signs and nursing note reviewed.  Constitutional:      General: She is not in acute distress.    Appearance: Normal appearance. She is well-developed. She is not ill-appearing, toxic-appearing or diaphoretic.     Interventions: Face mask in place.  HENT:     Head: Normocephalic and atraumatic.     Right Ear: External ear normal.     Left Ear: External ear normal.  Eyes:     General: Lids are normal. No scleral icterus.       Right eye: No discharge.        Left eye: No discharge.     Conjunctiva/sclera: Conjunctivae normal.  Neck:     Musculoskeletal: Normal range of motion and neck supple.     Trachea: Phonation normal. No tracheal deviation.  Cardiovascular:     Rate and Rhythm: Normal rate and regular rhythm.     Pulses: Normal pulses.          Radial pulses are 2+ on the right side and 2+ on the left side.       Posterior tibial pulses are 2+ on the right side and 2+ on the left side.     Heart sounds: Normal heart sounds. No murmur. No friction rub. No gallop.   Pulmonary:     Effort: Pulmonary effort is normal. No respiratory distress.     Breath sounds: Normal breath sounds. No stridor. No wheezing, rhonchi or rales.  Chest:     Chest wall: No tenderness.  Abdominal:     General: Bowel sounds are normal. There is no distension.     Palpations: Abdomen is soft.     Tenderness: There is no abdominal tenderness. There  is no guarding or rebound.  Musculoskeletal: Normal range of motion.        General: No deformity.     Right lower leg: No edema.     Left lower leg: No edema.  Lymphadenopathy:     Cervical: No cervical adenopathy.  Skin:    General: Skin is warm and dry.     Capillary Refill: Capillary refill takes less than 2 seconds.     Coloration: Skin is not jaundiced or pale.     Findings: No rash.  Neurological:     Mental Status: She is alert and oriented to person, place, and time.     Motor: No abnormal muscle tone.     Gait: Gait normal.  Psychiatric:        Speech: Speech normal.        Behavior: Behavior normal.      Results for orders placed or performed during the hospital encounter of 03/11/19  Urine culture   Specimen: Urine, Clean Catch  Result Value Ref Range   Specimen Description      URINE, CLEAN CATCH Performed at [facility], [address], [place], [place] [address]    Special Requests      NONE Performed at [facility], [address], [place], [place] [address]    Culture 90,000 COLONIES/mL ENTEROBACTER CLOACAE (A)    Report Status 03/13/2019 FINAL    Organism ID, Bacteria ENTEROBACTER CLOACAE (A)       Susceptibility   Enterobacter cloacae - MIC*    CEFAZOLIN >=64 RESISTANT Resistant     CEFTRIAXONE <=1 SENSITIVE Sensitive  CIPROFLOXACIN <=0.25 SENSITIVE Sensitive     GENTAMICIN <=1 SENSITIVE Sensitive     IMIPENEM 2 SENSITIVE Sensitive     NITROFURANTOIN 64 INTERMEDIATE Intermediate     TRIMETH/SULFA <=20 SENSITIVE Sensitive     PIP/TAZO <=4 SENSITIVE Sensitive     * 90,000 COLONIES/mL ENTEROBACTER CLOACAE  Urinalysis, Complete w Microscopic  Result Value Ref Range   Color, Urine RED (A) YELLOW   APPearance CLOUDY (A) CLEAR   Specific Gravity, Urine 1.025 1.005 - 1.030   pH 7.0 5.0 - 8.0   Glucose, UA NEGATIVE NEGATIVE mg/dL   Hgb urine dipstick LARGE (A) NEGATIVE   Bilirubin Urine NEGATIVE NEGATIVE   Ketones, ur  NEGATIVE NEGATIVE mg/dL   Protein, ur 100 (A) NEGATIVE mg/dL   Nitrite NEGATIVE NEGATIVE   Leukocytes,Ua SMALL (A) NEGATIVE   Squamous Epithelial / LPF 0-5 0 - 5   WBC, UA 0-5 0 - 5 WBC/hpf   RBC / HPF >50 0 - 5 RBC/hpf   Bacteria, UA FEW (A) NONE SEEN        Assessment & Plan:      ICD-10-CM   1. Urinary tract infection with hematuria, site unspecified  N39.0 POCT Urinalysis Dipstick   R31.9    f/up after UTI tx at [facility] 03/11/2019, urine dip negative did not add on culture again after reviewing micro and dip  2. Essential hypertension  I10 CMP w GFR   Blood pressure elevated with worsening renal function patient is slightly anxious we will have her hydrate, monitor her blood pressure and follow-up if >140/90  3. Prediabetes  R73.03 CMP w GFR   Recheck labs  4. Mixed hyperlipidemia  E78.2 CMP w GFR    Lipid Panel   Recheck cholesterol panel not on medications currently  5. Medication monitoring encounter  Z51.81 CMP w GFR    Lipid Panel  6. Stage 3a chronic kidney disease  N18.31 CMP w GFR   Recheck renal function encouraged her to avoid NSAIDs, work on blood pressure control, hydrate  7. Need for pneumococcal vaccination  Z23 Pneumococcal conjugate vaccine 13-valent   Agrees to pneumococcal vaccine to complete series    Labs not completed today - will need to return to do labs    Delsa Grana, PA-C 03/24/19 8:18 AM

## 2019-03-24 NOTE — Patient Instructions (Signed)
Return in the next week or two to do your labs.  Drink plenty of water the day before and the morning of your labs  Make appointment up front for when you are due for your medicare wellness

## 2019-04-08 ENCOUNTER — Telehealth: Payer: Self-pay | Admitting: Family Medicine

## 2019-04-08 DIAGNOSIS — Z0000 Encounter for general adult medical examination without abnormal findings: Secondary | ICD-10-CM

## 2019-04-08 DIAGNOSIS — E782 Mixed hyperlipidemia: Secondary | ICD-10-CM

## 2019-04-08 DIAGNOSIS — Z780 Asymptomatic menopausal state: Secondary | ICD-10-CM

## 2019-04-08 DIAGNOSIS — E538 Deficiency of other specified B group vitamins: Secondary | ICD-10-CM

## 2019-04-08 DIAGNOSIS — E559 Vitamin D deficiency, unspecified: Secondary | ICD-10-CM

## 2019-04-08 DIAGNOSIS — Z1231 Encounter for screening mammogram for malignant neoplasm of breast: Secondary | ICD-10-CM

## 2019-04-08 DIAGNOSIS — Z5181 Encounter for therapeutic drug level monitoring: Secondary | ICD-10-CM

## 2019-04-08 DIAGNOSIS — Z1159 Encounter for screening for other viral diseases: Secondary | ICD-10-CM

## 2019-04-08 DIAGNOSIS — R7303 Prediabetes: Secondary | ICD-10-CM

## 2019-04-08 DIAGNOSIS — N1831 Chronic kidney disease, stage 3a: Secondary | ICD-10-CM

## 2019-04-08 DIAGNOSIS — I10 Essential (primary) hypertension: Secondary | ICD-10-CM

## 2019-04-08 NOTE — Telephone Encounter (Signed)
Pt was recently seen for UTI f/up and BP was found to be high with recent labs showing worsening renal function.  Plan was to recheck labs, but she did not do at visit and has still not completed. Message received today with request to check protein and B12.  She does have Vit B 12 deficiency, protein will be checked with chemistry.   Other labs that were due and [facility] orders were updated today with request for pt to do a routine f/up visit.  [facility] will not address her chronic condition management or monitoring.    ICD-10-CM   1. Vitamin B12 deficiency  E53.8 CBC w/ Diff    Vitamin B12  2. Mixed hyperlipidemia  E78.2 CMP w GFR    Lipid Panel  3. Essential hypertension  I10   4. Stage 3a chronic kidney disease  N18.31 CMP w GFR  5. Medication monitoring encounter  Z51.81 CMP w GFR    CBC w/ Diff    Lipid Panel    A1C    Vit D    Hep C antibody    Vitamin B12  6. Morbid obesity ([facility])  E66.01 CMP w GFR    CBC w/ Diff    Lipid Panel  7. Prediabetes  R73.03 CMP w GFR    A1C  8. Encounter for screening mammogram for malignant neoplasm of breast  Z12.31 MM 3D SCREEN BREAST BILATERAL  9. Encounter for subsequent annual wellness visit (AWV) in Medicare patient  Z00.00   10. Postmenopausal estrogen deficiency  Z78.0 DG Bone Density  11. Vitamin D deficiency  E55.9 Vit D    DG Bone Density  12. Encounter for hepatitis C screening test for low risk patient  Z11.59 Hep C antibody   Delsa Grana, PA-C 04/08/19 11:23 AM

## 2019-04-08 NOTE — Telephone Encounter (Signed)
Copied from [place] [id number]. Topic: Appointment Scheduling - Scheduling Inquiry for Clinic >> Apr 07, 2019  1:54 PM Erick Blinks wrote: Reason for CRM: Pt is requesting lab work for Protein and B12 levels (wants it added to her physical) Best contact: [phone]    Spoke with patient and relayed message from Delsa Grana, PA-C on today, 04/08/2019.  Patient verbalized understanding and stated that she would make an appointment.

## 2019-04-09 DIAGNOSIS — N1831 Chronic kidney disease, stage 3a: Secondary | ICD-10-CM | POA: Diagnosis not present

## 2019-04-09 DIAGNOSIS — E538 Deficiency of other specified B group vitamins: Secondary | ICD-10-CM | POA: Diagnosis not present

## 2019-04-09 DIAGNOSIS — E782 Mixed hyperlipidemia: Secondary | ICD-10-CM | POA: Diagnosis not present

## 2019-04-09 DIAGNOSIS — R7303 Prediabetes: Secondary | ICD-10-CM | POA: Diagnosis not present

## 2019-04-09 DIAGNOSIS — Z5181 Encounter for therapeutic drug level monitoring: Secondary | ICD-10-CM | POA: Diagnosis not present

## 2019-04-09 DIAGNOSIS — E559 Vitamin D deficiency, unspecified: Secondary | ICD-10-CM | POA: Diagnosis not present

## 2019-04-09 DIAGNOSIS — Z1159 Encounter for screening for other viral diseases: Secondary | ICD-10-CM | POA: Diagnosis not present

## 2019-04-10 LAB — LIPID PANEL
Cholesterol: 220 mg/dL — ABNORMAL HIGH (ref <200)
HDL: 53 mg/dL (ref >=50)
LDL Cholesterol (Calc): 144 mg/dL (calc) — ABNORMAL HIGH
Non-HDL Cholesterol (Calc): 167 mg/dL (calc) — ABNORMAL HIGH (ref <130)
Total CHOL/HDL Ratio: 4.2 (calc) (ref <5.0)
Triglycerides: 112 mg/dL (ref <150)

## 2019-04-10 LAB — CBC WITH DIFFERENTIAL/PLATELET
Absolute Monocytes: 443 cells/uL (ref 200–950)
Basophils Absolute: 53 cells/uL (ref 0–200)
Basophils Relative: 0.9 %
Eosinophils Absolute: 142 cells/uL (ref 15–500)
Eosinophils Relative: 2.4 %
HCT: 43 % (ref 35.0–45.0)
Hemoglobin: 14.6 g/dL (ref 11.7–15.5)
Lymphs Abs: 2325 cells/uL (ref 850–3900)
MCH: 30.4 pg (ref 27.0–33.0)
MCHC: 34 g/dL (ref 32.0–36.0)
MCV: 89.6 fL (ref 80.0–100.0)
MPV: 10.2 fL (ref 7.5–12.5)
Monocytes Relative: 7.5 %
Neutro Abs: 2938 cells/uL (ref 1500–7800)
Neutrophils Relative %: 49.8 %
Platelets: 220 10*3/uL (ref 140–400)
RBC: 4.8 10*6/uL (ref 3.80–5.10)
RDW: 13.3 % (ref 11.0–15.0)
Total Lymphocyte: 39.4 %
WBC: 5.9 10*3/uL (ref 3.8–10.8)

## 2019-04-10 LAB — COMPLETE METABOLIC PANEL WITH GFR
AG Ratio: 1.5 (calc) (ref 1.0–2.5)
ALT: 12 U/L (ref 6–29)
AST: 11 U/L (ref 10–35)
Albumin: 4 g/dL (ref 3.6–5.1)
Alkaline phosphatase (APISO): 67 U/L (ref 37–153)
BUN/Creatinine Ratio: 11 (calc) (ref 6–22)
BUN: 13 mg/dL (ref 7–25)
CO2: 29 mmol/L (ref 20–32)
Calcium: 9 mg/dL (ref 8.6–10.4)
Chloride: 104 mmol/L (ref 98–110)
Creat: 1.14 mg/dL — ABNORMAL HIGH (ref 0.50–0.99)
GFR, Est African American: 57 mL/min/{1.73_m2} — ABNORMAL LOW (ref >=60)
GFR, Est Non African American: 49 mL/min/{1.73_m2} — ABNORMAL LOW (ref >=60)
Globulin: 2.6 g/dL (calc) (ref 1.9–3.7)
Glucose, Bld: 106 mg/dL — ABNORMAL HIGH (ref 65–99)
Potassium: 4.2 mmol/L (ref 3.5–5.3)
Sodium: 142 mmol/L (ref 135–146)
Total Bilirubin: 0.5 mg/dL (ref 0.2–1.2)
Total Protein: 6.6 g/dL (ref 6.1–8.1)

## 2019-04-10 LAB — VITAMIN B12: Vitamin B-12: 358 pg/mL (ref 200–1100)

## 2019-04-10 LAB — HEMOGLOBIN A1C
Hgb A1c MFr Bld: 5.9 % of total Hgb — ABNORMAL HIGH (ref <5.7)
Mean Plasma Glucose: 123 (calc)
eAG (mmol/L): 6.8 (calc)

## 2019-04-10 LAB — VITAMIN D 25 HYDROXY (VIT D DEFICIENCY, FRACTURES): Vit D, 25-Hydroxy: 67 ng/mL (ref 30–100)

## 2019-04-10 LAB — HEPATITIS C ANTIBODY
Hepatitis C Ab: NONREACTIVE
SIGNAL TO CUT-OFF: 0.01 (ref <1.00)

## 2019-04-14 ENCOUNTER — Ambulatory Visit: Payer: Medicare HMO | Admitting: Family Medicine

## 2019-04-14 ENCOUNTER — Other Ambulatory Visit: Payer: Self-pay | Admitting: Family Medicine

## 2019-04-14 MED ORDER — ATORVASTATIN CALCIUM 20 MG PO TABS: 20.0000 mg | ORAL_TABLET | Freq: Every day | ORAL | 3 refills | Status: DC

## 2019-04-24 ENCOUNTER — Other Ambulatory Visit: Payer: Self-pay

## 2019-04-24 ENCOUNTER — Ambulatory Visit (INDEPENDENT_AMBULATORY_CARE_PROVIDER_SITE_OTHER): Payer: Medicare HMO

## 2019-04-24 VITALS — BP 124/86 | HR 64 | Temp 97.1°F | Resp 16 | Ht 65.0 in | Wt 217.1 lb

## 2019-04-24 DIAGNOSIS — Z1331 Encounter for screening for depression: Secondary | ICD-10-CM | POA: Diagnosis not present

## 2019-04-24 DIAGNOSIS — Z0000 Encounter for general adult medical examination without abnormal findings: Secondary | ICD-10-CM

## 2019-04-24 NOTE — Patient Instructions (Addendum)
Katherine Gould , Thank you for taking time to come for your Medicare Wellness Visit. I appreciate your ongoing commitment to your health goals. Please review the following plan we discussed and let me know if I can assist you in the future.   Screening recommendations/referrals: Colonoscopy: done 04/23/15. Repeat in 2027. Mammogram: Please call [phone] to schedule your mammogram and bone density screening.   Recommended yearly ophthalmology/optometry visit for glaucoma screening and checkup Recommended yearly dental visit for hygiene and checkup  Vaccinations: Influenza vaccine: done 02/11/19 Pneumococcal vaccine: done 03/24/19 Tdap vaccine: due - please contact us if you get a cut or scrape Shingles vaccine: Shingrix discussed. Please contact your pharmacy for coverage information.   Advanced directives: Advance directive discussed with you today. I have provided a copy for you to complete at home and have notarized. Once this is complete please bring a copy in to our office so we can scan it into your chart.  Conditions/risks identified: Recommend increasing physical activity and monitoring saturated fats to lower cholesterol  Next appointment: Please follow up in one year for your Medicare Annual Wellness visit.     Preventive Care 69 Years and Older, Female Preventive care refers to lifestyle choices and visits with your health care provider that can promote health and wellness. What does preventive care include?  A yearly physical exam. This is also called an annual well check.  Dental exams once or twice a year.  Routine eye exams. Ask your health care provider how often you should have your eyes checked.  Personal lifestyle choices, including:  Daily care of your teeth and gums.  Regular physical activity.  Eating a healthy diet.  Avoiding tobacco and drug use.  Limiting alcohol use.  Practicing safe sex.  Taking low-dose aspirin every day.  Taking vitamin and  mineral supplements as recommended by your health care provider. What happens during an annual well check? The services and screenings done by your health care provider during your annual well check will depend on your age, overall health, lifestyle risk factors, and family history of disease. Counseling  Your health care provider may ask you questions about your:  Alcohol use.  Tobacco use.  Drug use.  Emotional well-being.  Home and relationship well-being.  Sexual activity.  Eating habits.  History of falls.  Memory and ability to understand (cognition).  Work and work Statistician.  Reproductive health. Screening  You may have the following tests or measurements:  Height, weight, and BMI.  Blood pressure.  Lipid and cholesterol levels. These may be checked every 5 years, or more frequently if you are over 34 years old.  Skin check.  Lung cancer screening. You may have this screening every year starting at age 75 if you have a 30-pack-year history of smoking and currently smoke or have quit within the past 15 years.  Fecal occult blood test (FOBT) of the stool. You may have this test every year starting at age 11.  Flexible sigmoidoscopy or colonoscopy. You may have a sigmoidoscopy every 5 years or a colonoscopy every 10 years starting at age 93.  Hepatitis C blood test.  Hepatitis B blood test.  Sexually transmitted disease (STD) testing.  Diabetes screening. This is done by checking your blood sugar (glucose) after you have not eaten for a while (fasting). You may have this done every 1-3 years.  Bone density scan. This is done to screen for osteoporosis. You may have this done starting at age 66.  Mammogram. This  may be done every 1-2 years. Talk to your health care provider about how often you should have regular mammograms. Talk with your health care provider about your test results, treatment options, and if necessary, the need for more tests. Vaccines   Your health care provider may recommend certain vaccines, such as:  Influenza vaccine. This is recommended every year.  Tetanus, diphtheria, and acellular pertussis (Tdap, Td) vaccine. You may need a Td booster every 10 years.  Zoster vaccine. You may need this after age 64.  Pneumococcal 13-valent conjugate (PCV13) vaccine. One dose is recommended after age 30.  Pneumococcal polysaccharide (PPSV23) vaccine. One dose is recommended after age 36. Talk to your health care provider about which screenings and vaccines you need and how often you need them. This information is not intended to replace advice given to you by your health care provider. Make sure you discuss any questions you have with your health care provider. Document Released: 04/30/2015 Document Revised: 12/22/2015 Document Reviewed: 02/02/2015 Elsevier Interactive Patient Education  2017 [place] Prevention in the Home Falls can cause injuries. They can happen to people of all ages. There are many things you can do to make your home safe and to help prevent falls. What can I do on the outside of my home?  Regularly fix the edges of walkways and driveways and fix any cracks.  Remove anything that might make you trip as you walk through a door, such as a raised step or threshold.  Trim any bushes or trees on the path to your home.  Use bright outdoor lighting.  Clear any walking paths of anything that might make someone trip, such as rocks or tools.  Regularly check to see if handrails are loose or broken. Make sure that both sides of any steps have handrails.  Any raised decks and porches should have guardrails on the edges.  Have any leaves, snow, or ice cleared regularly.  Use sand or salt on walking paths during winter.  Clean up any spills in your garage right away. This includes oil or grease spills. What can I do in the bathroom?  Use night lights.  Install grab bars by the toilet and in the  tub and shower. Do not use towel bars as grab bars.  Use non-skid mats or decals in the tub or shower.  If you need to sit down in the shower, use a plastic, non-slip stool.  Keep the floor dry. Clean up any water that spills on the floor as soon as it happens.  Remove soap buildup in the tub or shower regularly.  Attach bath mats securely with double-sided non-slip rug tape.  Do not have throw rugs and other things on the floor that can make you trip. What can I do in the bedroom?  Use night lights.  Make sure that you have a light by your bed that is easy to reach.  Do not use any sheets or blankets that are too big for your bed. They should not hang down onto the floor.  Have a firm chair that has side arms. You can use this for support while you get dressed.  Do not have throw rugs and other things on the floor that can make you trip. What can I do in the kitchen?  Clean up any spills right away.  Avoid walking on wet floors.  Keep items that you use a lot in easy-to-reach places.  If you need to reach something above  you, use a strong step stool that has a grab bar.  Keep electrical cords out of the way.  Do not use floor polish or wax that makes floors slippery. If you must use wax, use non-skid floor wax.  Do not have throw rugs and other things on the floor that can make you trip. What can I do with my stairs?  Do not leave any items on the stairs.  Make sure that there are handrails on both sides of the stairs and use them. Fix handrails that are broken or loose. Make sure that handrails are as long as the stairways.  Check any carpeting to make sure that it is firmly attached to the stairs. Fix any carpet that is loose or worn.  Avoid having throw rugs at the top or bottom of the stairs. If you do have throw rugs, attach them to the floor with carpet tape.  Make sure that you have a light switch at the top of the stairs and the bottom of the stairs. If you  do not have them, ask someone to add them for you. What else can I do to help prevent falls?  Wear shoes that:  Do not have high heels.  Have rubber bottoms.  Are comfortable and fit you well.  Are closed at the toe. Do not wear sandals.  If you use a stepladder:  Make sure that it is fully opened. Do not climb a closed stepladder.  Make sure that both sides of the stepladder are locked into place.  Ask someone to hold it for you, if possible.  Clearly mark and make sure that you can see:  Any grab bars or handrails.  First and last steps.  Where the edge of each step is.  Use tools that help you move around (mobility aids) if they are needed. These include:  Canes.  Walkers.  Scooters.  Crutches.  Turn on the lights when you go into a dark area. Replace any light bulbs as soon as they burn out.  Set up your furniture so you have a clear path. Avoid moving your furniture around.  If any of your floors are uneven, fix them.  If there are any pets around you, be aware of where they are.  Review your medicines with your doctor. Some medicines can make you feel dizzy. This can increase your chance of falling. Ask your doctor what other things that you can do to help prevent falls. This information is not intended to replace advice given to you by your health care provider. Make sure you discuss any questions you have with your health care provider. Document Released: 01/28/2009 Document Revised: 09/09/2015 Document Reviewed: 05/08/2014 Elsevier Interactive Patient Education  2017 [employer]   Chronic Kidney Disease, Adult Chronic kidney disease (CKD) happens when the kidneys are damaged over a long period of time. The kidneys are two organs that help with:  Getting rid of waste and extra fluid from the blood.  Making hormones that maintain the amount of fluid in your tissues and blood vessels.  Making sure that the body has the right amount of fluids and  chemicals. Most of the time, CKD does not go away, but it can usually be controlled. Steps must be taken to slow down the kidney damage or to stop it from getting worse. If this is not done, the kidneys may stop working. Follow these instructions at home: Medicines  Take over-the-counter and prescription medicines only as told by your  doctor. You may need to change the amount of medicines you take.  Do not take any new medicines unless your doctor says it is okay. Many medicines can make your kidney damage worse.  Do not take any vitamin and supplements unless your doctor says it is okay. Many vitamins and supplements can make your kidney damage worse. General instructions  Follow a diet as told by your doctor. You may need to stay away from: ? Alcohol. ? Salty foods. ? Foods that are high in:  Potassium.  Calcium.  Protein.  Do not use any products that contain nicotine or tobacco, such as cigarettes and e-cigarettes. If you need help quitting, ask your doctor.  Keep track of your blood pressure at home. Tell your doctor about any changes.  If you have diabetes, keep track of your blood sugar as told by your doctor.  Try to stay at a healthy weight. If you need help, ask your doctor.  Exercise at least 30 minutes a day, 5 days a week.  Stay up-to-date with your shots (immunizations) as told by your doctor.  Keep all follow-up visits as told by your doctor. This is important. Contact a doctor if:  Your symptoms get worse.  You have new symptoms. Get help right away if:  You have symptoms of end-stage kidney disease. These may include: ? Headaches. ? Numbness in your hands or feet. ? Easy bruising. ? Having hiccups often. ? Chest pain. ? Shortness of breath. ? Stopping of menstrual periods in women.  You have a fever.  You have very little pee (urine).  You have pain or bleeding when you pee. Summary  Chronic kidney disease (CKD) happens when the kidneys are  damaged over a long period of time.  Most of the time, this condition does not go away, but it can usually be controlled. Steps must be taken to slow down the kidney damage or to stop it from getting worse.  Treatment may include a combination of medicines and lifestyle changes. This information is not intended to replace advice given to you by your health care provider. Make sure you discuss any questions you have with your health care provider. Document Revised: 03/16/2017 Document Reviewed: 05/08/2016 Elsevier Patient Education  2020 [employer].

## 2019-04-24 NOTE — Progress Notes (Signed)
PHQ screening positive including    Contact pt with subspecialist with genetic testing locally - coming off effexor, put on cymbalta, depressive sx severe but no recent worsening She does have SI thoughts currently no plan, she is seeing psychiatry and doing therapy and has also started the process to see a subspecialist - neuropsychiatrist  Discussed at length today CKD and reviewed labs from the last several labs and history

## 2019-04-24 NOTE — Progress Notes (Signed)
Subjective:   Katherine Gould is a 69 y.o. female who presents for an Initial Medicare Annual Wellness Visit.  Review of Systems      Cardiac Risk Factors include: advanced age (>28men, >22 women);dyslipidemia;hypertension;obesity (BMI >30kg/m2);sedentary lifestyle     Objective:    Today's Vitals   04/24/19 1131  BP: 124/86  Pulse: 64  Resp: 16  Temp: (!) 97.1 F (36.2 C)  TempSrc: Temporal  SpO2: 97%  Weight: 217 lb 1.6 oz (98.5 kg)  Height: 5\' 5"  (1.651 m)   Body mass index is 36.13 kg/m.  Advanced Directives 04/24/2019 03/11/2019 10/11/2018  Does Patient Have a Medical Advance Directive? No No No  Would patient like information on creating a medical advance directive? Yes (MAU/Ambulatory/Procedural Areas - Information given) - -    Current Medications (verified) Outpatient Encounter Medications as of 04/24/2019  Medication Sig  . bisoprolol-hydrochlorothiazide (ZIAC) 10-6.25 MG tablet Take 1 tablet by mouth daily.  . Cholecalciferol (VITAMIN D3) 2000 UNITS capsule Take 2,000 Units by mouth daily. Pt taking 4000 IU daily  . clonazePAM (KLONOPIN) 1 MG tablet Take 1 mg by mouth 2 (two) times daily.  . famotidine (PEPCID) 20 MG tablet Take 1 tablet (20 mg total) by mouth 2 (two) times daily. (Patient taking differently: Take 20 mg by mouth 2 (two) times daily. Pt taking once daily at noon)  . Turmeric 500 MG CAPS Take by mouth.  Marland Kitchen atorvastatin (LIPITOR) 20 MG tablet Take 1 tablet (20 mg total) by mouth at bedtime. (Patient not taking: Reported on 04/24/2019)  . [DISCONTINUED] FLUoxetine (PROZAC) 20 MG tablet Take 20 mg by mouth daily.   No facility-administered encounter medications on file as of 04/24/2019.    Allergies (verified) Aripiprazole and Quetiapine   History: Past Medical History:  Diagnosis Date  . Cervical polyp   . CKD (chronic kidney disease)   . Dysthymic disorder   . Esophageal reflux   . Hyperlipidemia   . Hypertension   . Obsessive-compulsive  disorders   . Other abnormal glucose   . Other abnormal glucose   . Other B-complex deficiencies    Past Surgical History:  Procedure Laterality Date  . LASER ABLATION CONDYLOMA CERVICAL / VULVAR     Family History  Problem Relation Age of Onset  . Heart failure Mother   . Aortic stenosis Mother   . Hypertension Mother   . Stroke Mother   . Glaucoma Mother   . Heart attack Father   . Hypertension Father   . Heart attack Paternal Grandfather   . Glaucoma Sister   . Hypertension Sister   . Atrial fibrillation Sister   . Heart failure Sister   . Anxiety disorder Sister    Social History   Socioeconomic History  . Marital status: Divorced    Spouse name: Not on file  . Number of children: 1  . Years of education: 35  . Highest education level: High school graduate  Occupational History  . Occupation: disability  Tobacco Use  . Smoking status: Never Smoker  . Smokeless tobacco: Never Used  Substance and Sexual Activity  . Alcohol use: No  . Drug use: No  . Sexual activity: Not Currently  Other Topics Concern  . Not on file  Social History Narrative   Pt lives alone.    Social Determinants of Health   Financial Resource Strain: Low Risk   . Difficulty of Paying Living Expenses: Not hard at all  Food Insecurity: No Food Insecurity  .  Worried About Charity fundraiser in the Last Year: Never true  . Ran Out of Food in the Last Year: Never true  Transportation Needs: No Transportation Needs  . Lack of Transportation (Medical): No  . Lack of Transportation (Non-Medical): No  Physical Activity: Inactive  . Days of Exercise per Week: 0 days  . Minutes of Exercise per Session: 0 min  Stress: Stress Concern Present  . Feeling of Stress : Very much  Social Connections: Severely Isolated  . Frequency of Communication with Friends and Family: Once a week  . Frequency of Social Gatherings with Friends and Family: Once a week  . Attends Religious Services: Never  .  Active Member of Clubs or Organizations: No  . Attends Archivist Meetings: Never  . Marital Status: Divorced    Tobacco Counseling Counseling given: Not Answered   Clinical Intake:  Pre-visit preparation completed: Yes  Pain : No/denies pain     BMI - recorded: 36.13 Nutritional Status: BMI > 30  Obese Nutritional Risks: None Diabetes: No  How often do you need to have someone help you when you read instructions, pamphlets, or other written materials from your doctor or pharmacy?: 1 - Never  Interpreter Needed?: No  Information entered by :: Clemetine Marker LPN   Activities of Daily Living In your present state of health, do you have any difficulty performing the following activities: 04/24/2019 03/24/2019  Hearing? N N  Comment declines hearing aids -  Vision? N N  Difficulty concentrating or making decisions? N N  Walking or climbing stairs? N N  Dressing or bathing? N N  Doing errands, shopping? N N  Preparing Food and eating ? N -  Using the Toilet? N -  In the past six months, have you accidently leaked urine? N -  Do you have problems with loss of bowel control? N -  Managing your Medications? N -  Managing your Finances? N -  Housekeeping or managing your Housekeeping? N -  Some recent data might be hidden     Immunizations and Health Maintenance Immunization History  Administered Date(s) Administered  . Influenza, High Dose Seasonal PF 04/23/2017, 02/11/2019  . Influenza, Seasonal, Injecte, Preservative Fre 01/29/2013  . Influenza,inj,Quad PF,6+ Mos 01/15/2014, 12/30/2014, 01/27/2016  . Influenza-Unspecified 03/30/2011, 03/20/2012, 01/29/2013  . Pneumococcal Conjugate-13 03/24/2019  . Pneumococcal Polysaccharide-23 08/01/2016  . Zoster 01/15/2014   Health Maintenance Due  Topic Date Due  . DEXA SCAN  Feb 22, 1951  . MAMMOGRAM  10/27/1968    Patient Care Team: Delsa Grana, PA-C as PCP - General (Family Medicine)  Indicate any recent  Medical Services you may have received from other than [facility] providers in the past year (date may be approximate).     Assessment:   This is a routine wellness examination for [name].  Hearing/Vision screen  Hearing Screening   125Hz  250Hz  500Hz  1000Hz  2000Hz  3000Hz  4000Hz  6000Hz  8000Hz   Right ear:           Left ear:           Comments: Pt denies hearing difficulty   Vision Screening Comments: Pt past due for eye exam. Needs to establish with new provider.    Dietary issues and exercise activities discussed: Current Exercise Habits: The patient does not participate in regular exercise at present, Exercise limited by: orthopedic condition(s)  Goals    . Patient Stated     Patient states she would like to lower cholesterol with healthy diet and physical  activity       Depression Screen PHQ 2/9 Scores 03/24/2019 01/10/2019 12/10/2018 04/15/2018  PHQ - 2 Score 3 6 6 6   PHQ- 9 Score 3 25 24 24     Fall Risk Fall Risk  04/24/2019 04/24/2019 03/24/2019 01/10/2019 12/10/2018  Falls in the past year? 1 1 1  0 0  Number falls in past yr: 1 1 0 0 0  Injury with Fall? 0 0 0 0 0  Risk for fall due to : - History of fall(s) - - -  Follow up Falls prevention discussed Falls prevention discussed - Falls evaluation completed -    FALL RISK PREVENTION PERTAINING TO THE HOME:  Any stairs in or around the home? Yes  If so, do they handrails? No  2 steps at back door  Home free of loose throw rugs in walkways, pet beds, electrical cords, etc? Yes  Adequate lighting in your home to reduce risk of falls? Yes   ASSISTIVE DEVICES UTILIZED TO PREVENT FALLS:  Life alert? No  Use of a cane, walker or w/c? No  Grab bars in the bathroom? No  Shower chair or bench in shower? Yes Elevated toilet seat or a handicapped toilet? Yes  DME ORDERS:  DME order needed?  No   TIMED UP AND GO:  Was the test performed? Yes .  Length of time to ambulate 10 feet: 5 sec.   GAIT:  Appearance of gait: Gait  stead-fast and without the use of an assistive device.    Education: Fall risk prevention has been discussed.  Intervention(s) required? No   Cognitive Function: 6 CIT deferred during today's visit.         Screening Tests Health Maintenance  Topic Date Due  . DEXA SCAN  [birth year]  . MAMMOGRAM  10/27/1968  . COLONOSCOPY  04/22/2025  . INFLUENZA VACCINE  Completed  . Hepatitis C Screening  Completed  . PNA vac Low Risk Adult  Completed  . TETANUS/TDAP  Discontinued    Qualifies for Shingles Vaccine? Yes  Zostavax completed 2015. Due for Shingrix. Education has been provided regarding the importance of this vaccine. Pt has been advised to call insurance company to determine out of pocket expense. Advised may also receive vaccine at local pharmacy or Health Dept. Verbalized acceptance and understanding.  Tdap: Although this vaccine is not a covered service during a Wellness Exam, does the patient still wish to receive this vaccine today?  No .  Education has been provided regarding the importance of this vaccine. Advised may receive this vaccine at local pharmacy or Health Dept. Aware to provide a copy of the vaccination record if obtained from local pharmacy or Health Dept. Verbalized acceptance and understanding.  Flu Vaccine :Up to date  Pneumococcal Vaccine: Up to date  Cancer Screenings:  Colorectal Screening: Completed 04/23/15. Repeat every 10 years;   Mammogram: Due. Ordered 04/08/19. Pt provided with contact information and advised to call to schedule appt.   Bone Density: Due. Ordered 04/08/19. Pt provided with contact information and advised to call to schedule appt.   Lung Cancer Screening: (Low Dose CT Chest recommended if Age 52-80 years, 30 pack-year currently smoking OR have quit w/in 15years.) does not qualify.    Additional Screening:  Hepatitis C Screening: does qualify; Completed 04/09/19.   Vision Screening: Recommended annual ophthalmology exams for  early detection of glaucoma and other disorders of the eye. Is the patient up to date with their annual eye exam?  No  Who  is the provider or what is the name of the office in which the pt attends annual eye exams? Not established If pt is not established with a provider, would they like to be referred to a provider to establish care? No .   Dental Screening: Recommended annual dental exams for proper oral hygiene  Community Resource Referral:  CRR required this visit?  No      Plan:    I have personally reviewed and addressed the Medicare Annual Wellness questionnaire and have noted the following in the patient's chart:  A. Medical and social history B. Use of alcohol, tobacco or illicit drugs  C. Current medications and supplements D. Functional ability and status E.  Nutritional status F.  Physical activity G. Advance directives H. List of other physicians I.  Hospitalizations, surgeries, and ER visits in previous 12 months J.  [place] such as hearing and vision if needed, cognitive and depression L. Referrals and appointments   In addition, I have reviewed and discussed with patient certain preventive protocols, quality metrics, and best practice recommendations. A written personalized care plan for preventive services as well as general preventive health recommendations were provided to patient.   Signed,  Clemetine Marker, LPN Nurse Health Advisor   Nurse Notes: pt concerned about recent dx of kidney disease. Pt also had score of 27 on PHQ9. Notified Delsa Grana PAC who came in to speak to the patient about this and aware she is currently in therapy and psychiatry. See attached note from [facility].

## 2019-05-30 ENCOUNTER — Telehealth: Payer: Self-pay

## 2019-05-30 DIAGNOSIS — F329 Major depressive disorder, single episode, unspecified: Secondary | ICD-10-CM

## 2019-05-30 DIAGNOSIS — F429 Obsessive-compulsive disorder, unspecified: Secondary | ICD-10-CM

## 2019-05-30 DIAGNOSIS — F32A Depression, unspecified: Secondary | ICD-10-CM

## 2019-05-30 DIAGNOSIS — Z1331 Encounter for screening for depression: Secondary | ICD-10-CM

## 2019-05-30 NOTE — Telephone Encounter (Signed)
Copied from [place] [id number]. Topic: General - Inquiry >> May 30, 2019 12:36 PM Richardo Priest, [place] wrote: Reason for CRM: Pt called in stating she would like to know the psychiatrist that PCP has recommended that another pt goes to. Please advise.

## 2019-06-02 ENCOUNTER — Other Ambulatory Visit: Payer: Self-pay

## 2019-06-02 DIAGNOSIS — I10 Essential (primary) hypertension: Secondary | ICD-10-CM

## 2019-06-02 DIAGNOSIS — K219 Gastro-esophageal reflux disease without esophagitis: Secondary | ICD-10-CM

## 2019-06-02 NOTE — Telephone Encounter (Signed)
Hypertension medication request:03/24/19  Last office visit pertaining to hypertension:  BP Readings from Last 3 Encounters:  04/24/19 124/86  03/24/19 (!) 146/88  03/11/19 (!) 164/86    Lab Results  Component Value Date   CREATININE 1.14 (H) 04/09/2019   BUN 13 04/09/2019   NA 142 04/09/2019   K 4.2 04/09/2019   CL 104 04/09/2019   CO2 29 04/09/2019     No follow-ups on file.

## 2019-06-03 MED ORDER — FAMOTIDINE 20 MG PO TABS: 20.0000 mg | ORAL_TABLET | Freq: Two times a day (BID) | ORAL | 1 refills | Status: DC

## 2019-06-03 MED ORDER — BISOPROLOL-HYDROCHLOROTHIAZIDE 10-6.25 MG PO TABS: 1.0000 | ORAL_TABLET | Freq: Every day | ORAL | 1 refills | Status: DC

## 2019-06-03 NOTE — Telephone Encounter (Signed)
Patient called in checking in on this. Please advise as she would like to be seen soon by them.

## 2019-06-04 NOTE — Telephone Encounter (Signed)
Left detailed vm °

## 2019-06-13 ENCOUNTER — Encounter: Payer: Medicare HMO | Admitting: Family Medicine

## 2019-06-24 ENCOUNTER — Other Ambulatory Visit: Payer: Self-pay

## 2019-06-24 ENCOUNTER — Encounter: Payer: Self-pay | Admitting: Family Medicine

## 2019-06-24 ENCOUNTER — Ambulatory Visit (INDEPENDENT_AMBULATORY_CARE_PROVIDER_SITE_OTHER): Payer: Medicare HMO | Admitting: Family Medicine

## 2019-06-24 VITALS — BP 136/86 | HR 95 | Temp 98.1°F | Resp 14 | Ht 65.0 in | Wt 219.0 lb

## 2019-06-24 DIAGNOSIS — F419 Anxiety disorder, unspecified: Secondary | ICD-10-CM

## 2019-06-24 DIAGNOSIS — R300 Dysuria: Secondary | ICD-10-CM

## 2019-06-24 DIAGNOSIS — R69 Illness, unspecified: Secondary | ICD-10-CM | POA: Diagnosis not present

## 2019-06-24 DIAGNOSIS — I10 Essential (primary) hypertension: Secondary | ICD-10-CM

## 2019-06-24 DIAGNOSIS — Z5181 Encounter for therapeutic drug level monitoring: Secondary | ICD-10-CM | POA: Diagnosis not present

## 2019-06-24 DIAGNOSIS — R7303 Prediabetes: Secondary | ICD-10-CM

## 2019-06-24 DIAGNOSIS — E782 Mixed hyperlipidemia: Secondary | ICD-10-CM

## 2019-06-24 DIAGNOSIS — N1831 Chronic kidney disease, stage 3a: Secondary | ICD-10-CM | POA: Diagnosis not present

## 2019-06-24 DIAGNOSIS — F329 Major depressive disorder, single episode, unspecified: Secondary | ICD-10-CM

## 2019-06-24 LAB — POCT URINALYSIS DIPSTICK
Bilirubin, UA: NEGATIVE
Blood, UA: POSITIVE
Glucose, UA: NEGATIVE
Ketones, UA: NEGATIVE
Nitrite, UA: NEGATIVE
Odor: NORMAL
Protein, UA: NEGATIVE
Spec Grav, UA: 1.01 (ref 1.010–1.025)
Urobilinogen, UA: 0.2 E.U./dL
pH, UA: 5 (ref 5.0–8.0)

## 2019-06-24 MED ORDER — CEPHALEXIN 500 MG PO CAPS: 500.0000 mg | ORAL_CAPSULE | Freq: Four times a day (QID) | ORAL | 0 refills | Status: AC

## 2019-06-24 MED ORDER — PHENAZOPYRIDINE HCL 200 MG PO TABS: 200.0000 mg | ORAL_TABLET | Freq: Three times a day (TID) | ORAL | 0 refills | Status: DC

## 2019-06-24 NOTE — Patient Instructions (Signed)
Take the antibiotics and we will call you with the culture results  You can take pyridium for the burning pain with peeing   Urinary Tract Infection, Adult A urinary tract infection (UTI) is an infection of any part of the urinary tract. The urinary tract includes:  The kidneys.  The ureters.  The bladder.  The urethra. These organs make, store, and get rid of pee (urine) in the body. What are the causes? This is caused by germs (bacteria) in your genital area. These germs grow and cause swelling (inflammation) of your urinary tract. What increases the risk? You are more likely to develop this condition if:  You have a small, thin tube (catheter) to drain pee.  You cannot control when you pee or poop (incontinence).  You are female, and: ? You use these methods to prevent pregnancy:  A medicine that kills sperm (spermicide).  A device that blocks sperm (diaphragm). ? You have low levels of a female hormone (estrogen). ? You are pregnant.  You have genes that add to your risk.  You are sexually active.  You take antibiotic medicines.  You have trouble peeing because of: ? A prostate that is bigger than normal, if you are female. ? A blockage in the part of your body that drains pee from the bladder (urethra). ? A kidney stone. ? A nerve condition that affects your bladder (neurogenic bladder). ? Not getting enough to drink. ? Not peeing often enough.  You have other conditions, such as: ? Diabetes. ? A weak disease-fighting system (immune system). ? Sickle cell disease. ? Gout. ? Injury of the spine. What are the signs or symptoms? Symptoms of this condition include:  Needing to pee right away (urgently).  Peeing often.  Peeing small amounts often.  Pain or burning when peeing.  Blood in the pee.  Pee that smells bad or not like normal.  Trouble peeing.  Pee that is cloudy.  Fluid coming from the vagina, if you are female.  Pain in the belly or  lower back. Other symptoms include:  Throwing up (vomiting).  No urge to eat.  Feeling mixed up (confused).  Being tired and grouchy (irritable).  A fever.  Watery poop (diarrhea). How is this treated? This condition may be treated with:  Antibiotic medicine.  Other medicines.  Drinking enough water. Follow these instructions at home:  Medicines  Take over-the-counter and prescription medicines only as told by your doctor.  If you were prescribed an antibiotic medicine, take it as told by your doctor. Do not stop taking it even if you start to feel better. General instructions  Make sure you: ? Pee until your bladder is empty. ? Do not hold pee for a long time. ? Empty your bladder after sex. ? Wipe from front to back after pooping if you are a female. Use each tissue one time when you wipe.  Drink enough fluid to keep your pee pale yellow.  Keep all follow-up visits as told by your doctor. This is important. Contact a doctor if:  You do not get better after 1-2 days.  Your symptoms go away and then come back. Get help right away if:  You have very bad back pain.  You have very bad pain in your lower belly.  You have a fever.  You are sick to your stomach (nauseous).  You are throwing up. Summary  A urinary tract infection (UTI) is an infection of any part of the urinary tract.  This condition is caused by germs in your genital area.  There are many risk factors for a UTI. These include having a small, thin tube to drain pee and not being able to control when you pee or poop.  Treatment includes antibiotic medicines for germs.  Drink enough fluid to keep your pee pale yellow. This information is not intended to replace advice given to you by your health care provider. Make sure you discuss any questions you have with your health care provider. Document Revised: 03/21/2018 Document Reviewed: 10/11/2017 Elsevier Patient Education  2020 [employer].

## 2019-06-24 NOTE — Progress Notes (Signed)
Patient ID: Katherine Gould, female    DOB: [birth year], 69 y.o.   MRN: [id number]  PCP: Delsa Grana, PA-C  Chief Complaint  Patient presents with  . Urinary Tract Infection    frequency and burning    Subjective:   Katherine Gould is a 69 y.o. female, presents to clinic with CC of the following:  HPI    Urinary frequency and burning x 2 days, she denies any abd pain, flank pain, hematuria, N, V, appetite change, fever chills, sweats.  Results for orders placed or performed in visit on 06/24/19  POCT urinalysis dipstick  Result Value Ref Range   Color, UA yellow    Clarity, UA clear    Glucose, UA Negative Negative   Bilirubin, UA neg    Ketones, UA neg    Spec Grav, UA 1.010 1.010 - 1.025   Blood, UA positive    pH, UA 5.0 5.0 - 8.0   Protein, UA Negative Negative   Urobilinogen, UA 0.2 0.2 or 1.0 E.U./dL   Nitrite, UA neg    Leukocytes, UA Large (3+) (A) Negative   Appearance clear    Odor normal   She is concerned about her kidneys with reported past med hx of UTI that made her septic, also concerned about kidney disease.     Last UTI was in Nov, culture positive for enterobacter cloacae   Patient Active Problem List   Diagnosis Date Noted  . Postmenopausal estrogen deficiency 04/08/2019  . Stage 3a chronic kidney disease 04/08/2019  . GERD (gastroesophageal reflux disease) 12/10/2018  . Anxiety and depression 04/15/2018  . OCD (obsessive compulsive disorder) 04/15/2018  . Morbid obesity ([place]) 04/15/2018  . Medication monitoring encounter 04/15/2018  . Vitamin B12 deficiency 04/15/2018  . Vitamin D deficiency 04/15/2018  . Bilateral primary osteoarthritis of knee 04/15/2018  . Interstitial cystitis 04/15/2018  . OSA (obstructive sleep apnea) 07/14/2015  . Hypertension   . Hyperlipidemia 07/03/2012  . Prediabetes 07/03/2012      Current Outpatient Medications:  .  bisoprolol-hydrochlorothiazide (ZIAC) 10-6.25 MG tablet, Take 1 tablet by mouth daily.,  Disp: 90 tablet, Rfl: 1 .  Cholecalciferol (VITAMIN D3) 2000 UNITS capsule, Take 2,000 Units by mouth daily. Pt taking 4000 IU daily, Disp: , Rfl:  .  clonazePAM (KLONOPIN) 1 MG tablet, Take 1 mg by mouth 2 (two) times daily., Disp: , Rfl:  .  DULoxetine (CYMBALTA) 20 MG capsule, Take 20 mg by mouth 2 (two) times daily., Disp: , Rfl:  .  famotidine (PEPCID) 20 MG tablet, Take 1 tablet (20 mg total) by mouth 2 (two) times daily., Disp: 180 tablet, Rfl: 1 .  Turmeric 500 MG CAPS, Take by mouth., Disp: , Rfl:  .  atorvastatin (LIPITOR) 20 MG tablet, Take 1 tablet (20 mg total) by mouth at bedtime. (Patient not taking: Reported on 04/24/2019), Disp: 90 tablet, Rfl: 3 .  venlafaxine XR (EFFEXOR-XR) 75 MG 24 hr capsule, Take 75 mg by mouth daily., Disp: , Rfl:    Allergies  Allergen Reactions  . Aripiprazole Other (See Comments)    Incubus Dream tremmors   . Quetiapine Other (See Comments)    Incubus dream     Family History  Problem Relation Age of Onset  . Heart failure Mother   . Aortic stenosis Mother   . Hypertension Mother   . Stroke Mother   . Glaucoma Mother   . Heart attack Father   . Hypertension Father   . Heart  attack Paternal Grandfather   . Glaucoma Sister   . Hypertension Sister   . Atrial fibrillation Sister   . Heart failure Sister   . Anxiety disorder Sister      Social History   Socioeconomic History  . Marital status: Divorced    Spouse name: Not on file  . Number of children: 1  . Years of education: 110  . Highest education level: High school graduate  Occupational History  . Occupation: disability  Tobacco Use  . Smoking status: Never Smoker  . Smokeless tobacco: Never Used  Substance and Sexual Activity  . Alcohol use: No  . Drug use: No  . Sexual activity: Not Currently  Other Topics Concern  . Not on file  Social History Narrative   Pt lives alone.    Social Determinants of Health   Financial Resource Strain: Low Risk   . Difficulty of  Paying Living Expenses: Not hard at all  Food Insecurity: No Food Insecurity  . Worried About Charity fundraiser in the Last Year: Never true  . Ran Out of Food in the Last Year: Never true  Transportation Needs: No Transportation Needs  . Lack of Transportation (Medical): No  . Lack of Transportation (Non-Medical): No  Physical Activity: Inactive  . Days of Exercise per Week: 0 days  . Minutes of Exercise per Session: 0 min  Stress: Stress Concern Present  . Feeling of Stress : Very much  Social Connections: Severely Isolated  . Frequency of Communication with Friends and Family: Once a week  . Frequency of Social Gatherings with Friends and Family: Once a week  . Attends Religious Services: Never  . Active Member of Clubs or Organizations: No  . Attends Archivist Meetings: Never  . Marital Status: Divorced  Human resources officer Violence: Not At Risk  . Fear of Current or Ex-Partner: No  . Emotionally Abused: No  . Physically Abused: No  . Sexually Abused: No    Chart Review Today: I personally reviewed active problem list, medication list, allergies, family history, social history, health maintenance, notes from last encounter, lab results, imaging with the patient/caregiver today.   Review of Systems 10 Systems reviewed and are negative for acute change except as noted in the HPI.     Objective:   Vitals:   06/24/19 1413  BP: 136/86  Pulse: 95  Resp: 14  Temp: 98.1 F (36.7 C)  SpO2: 95%  Weight: 219 lb (99.3 kg)  Height: 5\' 5"  (1.651 m)    Body mass index is 36.44 kg/m.  Physical Exam Vitals and nursing note reviewed.  Constitutional:      General: She is not in acute distress.    Appearance: Normal appearance. She is obese. She is not ill-appearing, toxic-appearing or diaphoretic.  HENT:     Right Ear: External ear normal.     Left Ear: External ear normal.  Eyes:     General:        Right eye: No discharge.        Left eye: No discharge.      Conjunctiva/sclera: Conjunctivae normal.  Cardiovascular:     Rate and Rhythm: Normal rate and regular rhythm.     Pulses: Normal pulses.  Pulmonary:     Effort: Pulmonary effort is normal.     Breath sounds: Normal breath sounds.  Abdominal:     General: Abdomen is protuberant. Bowel sounds are normal. There is no distension.     Palpations:  Abdomen is soft.     Tenderness: There is no abdominal tenderness. There is no right CVA tenderness, left CVA tenderness or guarding.     Hernia: No hernia is present.  Neurological:     Mental Status: She is alert.  Psychiatric:        Mood and Affect: Mood is anxious and depressed.        Behavior: Behavior is hyperactive.        Thought Content: Thought content does not include suicidal ideation. Thought content does not include suicidal plan.      Results for orders placed or performed in visit on 06/24/19  POCT urinalysis dipstick  Result Value Ref Range   Color, UA yellow    Clarity, UA clear    Glucose, UA Negative Negative   Bilirubin, UA neg    Ketones, UA neg    Spec Grav, UA 1.010 1.010 - 1.025   Blood, UA positive    pH, UA 5.0 5.0 - 8.0   Protein, UA Negative Negative   Urobilinogen, UA 0.2 0.2 or 1.0 E.U./dL   Nitrite, UA neg    Leukocytes, UA Large (3+) (A) Negative   Appearance clear    Odor normal         Assessment & Plan:      ICD-10-CM   1. Burning with urination  R30.0 POCT urinalysis dipstick    Urine Culture    cephALEXin (KEFLEX) 500 MG capsule    phenazopyridine (PYRIDIUM) 200 MG tablet   tx for UTI, follow culture closely, pt encouraged to f/up if not feeling better with abx in 2-3 days  2. Mixed hyperlipidemia  [id number] COMPLETE METABOLIC PANEL WITH GFR    Lipid panel  3. Essential hypertension  [id number] COMPLETE METABOLIC PANEL WITH GFR  4. Stage 3a chronic kidney disease  [id number] COMPLETE METABOLIC PANEL WITH GFR  5. Prediabetes  [id number] COMPLETE METABOLIC PANEL WITH GFR    Hemoglobin A1c  6. Encounter  for medication monitoring  [id number] COMPLETE METABOLIC PANEL WITH GFR    CBC with Differential/Platelet    Lipid panel    Hemoglobin A1c  7. Anxiety and depression  F41.9 DULoxetine (CYMBALTA) 20 MG capsule   F32.9    worsening sx after coming off effexor, she wants to increase cymbalta - increase from 20 mg to 40 mg daily     Suspected UTI with + blood and leuks, sent for culture, recent UTIs, pt very anxious about worsening infection Will start tx with keflex, pyridium for pain, close f/up.  Pt asked for labs to be entered so they can be done and completed before her next routine O/V    Delsa Grana, PA-C 06/24/19 2:37 PM

## 2019-06-26 LAB — URINE CULTURE
MICRO NUMBER:: 10232315
SPECIMEN QUALITY:: ADEQUATE

## 2019-06-26 MED ORDER — SULFAMETHOXAZOLE-TRIMETHOPRIM 800-160 MG PO TABS: 1.0000 | ORAL_TABLET | Freq: Two times a day (BID) | ORAL | 0 refills | Status: DC

## 2019-06-27 ENCOUNTER — Ambulatory Visit: Payer: Self-pay

## 2019-06-27 NOTE — Telephone Encounter (Signed)
Pt. Reports she started the Bactrim as prescribed, but has itching "all over after taking two pills." Took Benadryl. Will not take any more of the medication. No rash. Please advise pt.  Answer Assessment - Initial Assessment Questions 1.   NAME of MEDICATION: "What medicine are you calling about?"     Bactrim 2.   QUESTION: "What is your question?"     Having itching 3.   PRESCRIBING HCP: "Who prescribed it?" Reason: if prescribed by specialist, call should be referred to that group.     [place] 4. SYMPTOMS: "Do you have any symptoms?"     Itching 5. SEVERITY: If symptoms are present, ask "Are they mild, moderate or severe?"     Moderate 6.  PREGNANCY:  "Is there any chance that you are pregnant?" "When was your last menstrual period?"     No  Protocols used: MEDICATION QUESTION CALL-A-AH

## 2019-06-30 ENCOUNTER — Encounter: Payer: Self-pay | Admitting: Family Medicine

## 2019-06-30 NOTE — Telephone Encounter (Signed)
Left detailed vm °

## 2019-07-01 ENCOUNTER — Telehealth: Payer: Self-pay | Admitting: Family Medicine

## 2019-07-01 NOTE — Telephone Encounter (Signed)
Copied from [place] [phone]. Topic: General - Other >> Jul 01, 2019  4:06 PM Keene Breath wrote: Reason for CRM: Patient called to ask if she can leave a urine sample when she comes in on Friday for her blood work instead of coming back two times.  Please call patient to let her know at [phone]

## 2019-07-02 NOTE — Telephone Encounter (Signed)
Left vm we could do at CPE on 3/23

## 2019-07-08 ENCOUNTER — Other Ambulatory Visit: Payer: Self-pay | Admitting: Family Medicine

## 2019-07-08 ENCOUNTER — Encounter: Payer: Medicare HMO | Admitting: Family Medicine

## 2019-07-08 DIAGNOSIS — I10 Essential (primary) hypertension: Secondary | ICD-10-CM

## 2019-07-08 NOTE — Telephone Encounter (Signed)
Left message for patient to return call. Refills of requested medication available at the pharmacy.

## 2019-07-08 NOTE — Telephone Encounter (Signed)
Copied from [phone]. Topic: Quick Communication - Rx Refill/Question >> Jul 08, 2019  3:58 PM Gould, Katherine Reap wrote: Pt stated she is completely out of her medication. Medication: bisoprolol-hydrochlorothiazide (ZIAC) 10-6.25 MG tablet  Has the patient contacted their pharmacy? no  Preferred Pharmacy (with phone number or street name): [id number] - [place], [place]  Phone: [phone]  Fax: [phone]  Agent: Please be advised that RX refills may take up to 3 business days. We ask that you follow-up with your pharmacy.

## 2019-07-10 DIAGNOSIS — F410 Panic disorder [episodic paroxysmal anxiety] without agoraphobia: Secondary | ICD-10-CM | POA: Diagnosis not present

## 2019-07-10 DIAGNOSIS — R69 Illness, unspecified: Secondary | ICD-10-CM | POA: Diagnosis not present

## 2019-07-25 ENCOUNTER — Telehealth: Payer: Self-pay

## 2019-07-25 NOTE — Telephone Encounter (Signed)
Copied from [place] [id number]. Topic: General - Other >> Jul 25, 2019  1:20 PM Rayann Heman wrote: Reason for CRM: pt called and stated that she is not feeling well but still needs to come in the office on 07/28/19. Pt would like a call back from the office. Please advise >> Jul 25, 2019  2:07 PM Myatt, Marland Kitchen wrote: Called pt and she explained that she does not have Covid symptoms. She doesn't feel good from being taken off of her anti-deperessant   Does patient want an appointment on 07/28/2019, if so please call and schedule appointment.

## 2019-07-28 ENCOUNTER — Encounter: Payer: Medicare HMO | Admitting: Family Medicine

## 2019-07-29 NOTE — Telephone Encounter (Signed)
lvm to inform pt she can come into the office for her appt as long as she has no Covid issues

## 2019-07-29 NOTE — Telephone Encounter (Signed)
Pt called asking if she can come in the office.  She was given an appt but it is scheduled as a virtual.  She said she thinks she has a UTI and will need to give a sample so she wants to come in the office .  CB#  [phone]

## 2019-07-30 NOTE — Telephone Encounter (Addendum)
Patient checking on the status of below message, patient denied having any COVID symptoms. Patient scheduled for 07/31/2019 at 8:20am with PCP

## 2019-07-31 ENCOUNTER — Other Ambulatory Visit: Payer: Self-pay

## 2019-07-31 ENCOUNTER — Encounter: Payer: Self-pay | Admitting: Family Medicine

## 2019-07-31 ENCOUNTER — Ambulatory Visit (INDEPENDENT_AMBULATORY_CARE_PROVIDER_SITE_OTHER): Payer: Medicare HMO | Admitting: Family Medicine

## 2019-07-31 VITALS — BP 138/88 | HR 74 | Temp 98.1°F | Resp 14 | Ht 65.0 in | Wt 219.3 lb

## 2019-07-31 DIAGNOSIS — N1831 Chronic kidney disease, stage 3a: Secondary | ICD-10-CM | POA: Diagnosis not present

## 2019-07-31 DIAGNOSIS — R7303 Prediabetes: Secondary | ICD-10-CM | POA: Diagnosis not present

## 2019-07-31 DIAGNOSIS — I10 Essential (primary) hypertension: Secondary | ICD-10-CM | POA: Diagnosis not present

## 2019-07-31 DIAGNOSIS — R350 Frequency of micturition: Secondary | ICD-10-CM | POA: Diagnosis not present

## 2019-07-31 DIAGNOSIS — R32 Unspecified urinary incontinence: Secondary | ICD-10-CM

## 2019-07-31 DIAGNOSIS — Z5181 Encounter for therapeutic drug level monitoring: Secondary | ICD-10-CM | POA: Diagnosis not present

## 2019-07-31 DIAGNOSIS — E782 Mixed hyperlipidemia: Secondary | ICD-10-CM | POA: Diagnosis not present

## 2019-07-31 LAB — POCT URINALYSIS DIPSTICK
Bilirubin, UA: NEGATIVE
Blood, UA: NEGATIVE
Glucose, UA: NEGATIVE
Ketones, UA: NEGATIVE
Nitrite, UA: NEGATIVE
Odor: NORMAL
Protein, UA: NEGATIVE
Spec Grav, UA: 1.015 (ref 1.010–1.025)
Urobilinogen, UA: 0.2 E.U./dL
pH, UA: 5.5 (ref 5.0–8.0)

## 2019-07-31 NOTE — Patient Instructions (Signed)
Start a daily antihistamine like zyrtec claritin allegra or xyzal or a generic equivalent.  Take one tablet once a day at bedtime.   Use saline spray in your nose to help hydrate.  You can try a steroid nasal spray if you feel congested.  Let us know if you have any worsening sinus symptoms or coughing/chest symptoms.  For now we will wait on the urine culture and labs and address what is abnormal or positive.

## 2019-07-31 NOTE — Progress Notes (Signed)
Patient ID: Katherine Gould, female    DOB: 05/13/1950, 69 y.o.   MRN: [id number]  PCP: Delsa Grana, PA-C  Chief Complaint  Patient presents with  . Urinary Tract Infection    recheck, grequency and incontinence    Subjective:   Katherine Gould is a 69 y.o. female, presents to clinic with CC of the following:  HPI  PT here feeling very ill.  Recheck of urine with recent UTI UTI last month with enterobacter    Component 1 mo ago  MICRO NUMBER: [id number]   SPECIMEN QUALITY: Adequate   Sample Source URINE   STATUS: FINAL   ISOLATE 1: Enterobacter cloacae complexAbnormal    Comment: 50,000-100,000 CFU/mL of Enterobacter cloacae complex  Resulting Agency [facility]  Susceptibility   Enterobacter cloacae complex    URINE CULTURE, REFLEX    AMOX/CLAVULANIC >=32  Resistant    CEFAZOLIN >=64  Resistant1    CEFEPIME <=1  Sensitive    CEFTRIAXONE <=1  Sensitive    CIPROFLOXACIN <=0.25  Sensitive    ERTAPENEM <=0.5  Sensitive    GENTAMICIN <=1  Sensitive    LEVOFLOXACIN <=0.12  Sensitive    NITROFURANTOIN 64  Intermediate    PIP/TAZO <=4  Sensitive    TOBRAMYCIN <=1  Sensitive    TRIMETH/SULFA <=20  Sensitive2         1 For uncomplicated UTI caused by E. coli,  K. pneumoniae or P. mirabilis: Cefazolin is  susceptible if MIC <32 mcg/mL and predicts  susceptible to the oral agents cefaclor, cefdinir,  cefpodoxime, cefprozil, cefuroxime, cephalexin  and loracarbef.  2 Legend:  S = Susceptible I = Intermediate  R = Resistant NS = Not susceptible  * = Not tested NR = Not reported  **NN = See antimicrobic comments        Specimen Collected: 06/24/19 14:48 Last Resulted: 06/26/19 11:41       She continues to have GI problems and constipation She was treated last month with bactrim and keflex  She feels generally bad is concerned that she has recurrent infection, she does want to do her routine labs today for upcoming routine office visit follow-up She denies any recent fever  chills sweats nausea vomiting.  Patient Active Problem List   Diagnosis Date Noted  . Postmenopausal estrogen deficiency 04/08/2019  . Stage 3a chronic kidney disease 04/08/2019  . GERD (gastroesophageal reflux disease) 12/10/2018  . Anxiety and depression 04/15/2018  . OCD (obsessive compulsive disorder) 04/15/2018  . Morbid obesity ([place]) 04/15/2018  . Medication monitoring encounter 04/15/2018  . Vitamin B12 deficiency 04/15/2018  . Vitamin D deficiency 04/15/2018  . Bilateral primary osteoarthritis of knee 04/15/2018  . Interstitial cystitis 04/15/2018  . OSA (obstructive sleep apnea) 07/14/2015  . Hypertension   . Hyperlipidemia 07/03/2012  . Prediabetes 07/03/2012      Current Outpatient Medications:  .  bisoprolol-hydrochlorothiazide (ZIAC) 10-6.25 MG tablet, Take 1 tablet by mouth daily., Disp: 90 tablet, Rfl: 1 .  Cholecalciferol (VITAMIN D3) 2000 UNITS capsule, Take 2,000 Units by mouth daily. Pt taking 4000 IU daily, Disp: , Rfl:  .  clonazePAM (KLONOPIN) 1 MG tablet, Take 1 mg by mouth 2 (two) times daily., Disp: , Rfl:  .  famotidine (PEPCID) 20 MG tablet, Take 1 tablet (20 mg total) by mouth 2 (two) times daily., Disp: 180 tablet, Rfl: 1 .  atorvastatin (LIPITOR) 20 MG tablet, Take 1 tablet (20 mg total) by mouth at bedtime. (Patient not taking: Reported on  04/24/2019), Disp: 90 tablet, Rfl: 3 .  DULoxetine (CYMBALTA) 20 MG capsule, Take 20 mg by mouth 2 (two) times daily., Disp: , Rfl:  .  phenazopyridine (PYRIDIUM) 200 MG tablet, Take 1 tablet (200 mg total) by mouth 3 (three) times daily. (Patient not taking: Reported on 07/31/2019), Disp: 6 tablet, Rfl: 0 .  Turmeric 500 MG CAPS, Take by mouth., Disp: , Rfl:    Allergies  Allergen Reactions  . Aripiprazole Other (See Comments)    Incubus Dream tremmors   . Quetiapine Other (See Comments)    Incubus dream     Family History  Problem Relation Age of Onset  . Heart failure Mother   . Aortic stenosis Mother     . Hypertension Mother   . Stroke Mother   . Glaucoma Mother   . Heart attack Father   . Hypertension Father   . Heart attack Paternal Grandfather   . Glaucoma Sister   . Hypertension Sister   . Atrial fibrillation Sister   . Heart failure Sister   . Anxiety disorder Sister      Social History   Socioeconomic History  . Marital status: Divorced    Spouse name: Not on file  . Number of children: 1  . Years of education: 22  . Highest education level: High school graduate  Occupational History  . Occupation: disability  Tobacco Use  . Smoking status: Never Smoker  . Smokeless tobacco: Never Used  Substance and Sexual Activity  . Alcohol use: No  . Drug use: No  . Sexual activity: Not Currently  Other Topics Concern  . Not on file  Social History Narrative   Pt lives alone.    Social Determinants of Health   Financial Resource Strain: Low Risk   . Difficulty of Paying Living Expenses: Not hard at all  Food Insecurity: No Food Insecurity  . Worried About Charity fundraiser in the Last Year: Never true  . Ran Out of Food in the Last Year: Never true  Transportation Needs: No Transportation Needs  . Lack of Transportation (Medical): No  . Lack of Transportation (Non-Medical): No  Physical Activity: Inactive  . Days of Exercise per Week: 0 days  . Minutes of Exercise per Session: 0 min  Stress: Stress Concern Present  . Feeling of Stress : Very much  Social Connections: Severely Isolated  . Frequency of Communication with Friends and Family: Once a week  . Frequency of Social Gatherings with Friends and Family: Once a week  . Attends Religious Services: Never  . Active Member of Clubs or Organizations: No  . Attends Archivist Meetings: Never  . Marital Status: Divorced  Human resources officer Violence: Not At Risk  . Fear of Current or Ex-Partner: No  . Emotionally Abused: No  . Physically Abused: No  . Sexually Abused: No    Chart Review Today: I  personally reviewed active problem list, medication list, allergies, family history, social history, health maintenance, notes from last encounter, lab results, imaging with the patient/caregiver today.   Review of Systems 10 Systems reviewed and are negative for acute change except as noted in the HPI.     Objective:   Vitals:   07/31/19 0840  BP: 138/88  Pulse: 74  Resp: 14  Temp: 98.1 F (36.7 C)  SpO2: 92%  Weight: 219 lb 4.8 oz (99.5 kg)  Height: 5\' 5"  (1.651 m)    Body mass index is 36.49 kg/m.  Physical Exam  Vitals and nursing note reviewed.  Constitutional:      General: She is not in acute distress.    Appearance: She is well-developed. She is obese. She is not ill-appearing, toxic-appearing or diaphoretic.  HENT:     Head: Normocephalic and atraumatic.     Nose: Nose normal.  Eyes:     General:        Right eye: No discharge.        Left eye: No discharge.     Conjunctiva/sclera: Conjunctivae normal.  Neck:     Trachea: No tracheal deviation.  Cardiovascular:     Rate and Rhythm: Normal rate and regular rhythm.     Pulses: Normal pulses.     Heart sounds: Normal heart sounds.  Pulmonary:     Effort: Pulmonary effort is normal. No respiratory distress.     Breath sounds: Normal breath sounds. No stridor.  Abdominal:     General: Bowel sounds are normal. There is no distension.     Tenderness: There is no abdominal tenderness. There is no right CVA tenderness, left CVA tenderness, guarding or rebound.  Musculoskeletal:        General: Normal range of motion.  Skin:    General: Skin is warm and dry.     Findings: No rash.  Neurological:     Mental Status: She is alert.     Motor: No abnormal muscle tone.     Coordination: Coordination normal.  Psychiatric:        Mood and Affect: Mood is depressed. Affect is flat.        Behavior: Behavior normal. Behavior is cooperative.      Results for orders placed or performed in visit on 07/31/19  POCT  urinalysis dipstick  Result Value Ref Range   Color, UA yellow    Clarity, UA clear    Glucose, UA Negative Negative   Bilirubin, UA neg    Ketones, UA neg    Spec Grav, UA 1.015 1.010 - 1.025   Blood, UA neg    pH, UA 5.5 5.0 - 8.0   Protein, UA Negative Negative   Urobilinogen, UA 0.2 0.2 or 1.0 E.U./dL   Nitrite, UA neg    Leukocytes, UA Trace (A) Negative   Appearance clear    Odor normal         Assessment & Plan:      ICD-10-CM   1. Urinary frequency  R35.0 POCT urinalysis dipstick    Urine Culture  2. Urinary incontinence, unspecified type  R32 POCT urinalysis dipstick    Urine Culture     Patient is here for follow-up urinalysis and urine culture after UTI treated with 2 antibiotics.  Point-of-care urine was largely unremarkable only trace leukocytes, will send for culture and will not treat with antibiotics unless positive and symptomatic.  Patient has been having a lot of GI symptoms but does not have any abdominal tenderness today or flank pain, abdominal exam was benign and vital signs are stable.  She continues to have flat affect and depressed mood which is her baseline she is managing this with a specialist.   Delsa Grana, PA-C 07/31/19 8:49 AM

## 2019-08-01 LAB — CBC WITH DIFFERENTIAL/PLATELET
Absolute Monocytes: 540 cells/uL (ref 200–950)
Basophils Absolute: 60 cells/uL (ref 0–200)
Basophils Relative: 1 %
Eosinophils Absolute: 120 cells/uL (ref 15–500)
Eosinophils Relative: 2 %
HCT: 44.3 % (ref 35.0–45.0)
Hemoglobin: 14.9 g/dL (ref 11.7–15.5)
Lymphs Abs: 2850 cells/uL (ref 850–3900)
MCH: 30.9 pg (ref 27.0–33.0)
MCHC: 33.6 g/dL (ref 32.0–36.0)
MCV: 91.9 fL (ref 80.0–100.0)
MPV: 10.4 fL (ref 7.5–12.5)
Monocytes Relative: 9 %
Neutro Abs: 2430 cells/uL (ref 1500–7800)
Neutrophils Relative %: 40.5 %
Platelets: 267 10*3/uL (ref 140–400)
RBC: 4.82 10*6/uL (ref 3.80–5.10)
RDW: 13 % (ref 11.0–15.0)
Total Lymphocyte: 47.5 %
WBC: 6 10*3/uL (ref 3.8–10.8)

## 2019-08-01 LAB — URINE CULTURE
MICRO NUMBER:: 10368727
SPECIMEN QUALITY:: ADEQUATE

## 2019-08-01 LAB — COMPLETE METABOLIC PANEL WITH GFR
AG Ratio: 1.5 (calc) (ref 1.0–2.5)
ALT: 10 U/L (ref 6–29)
AST: 13 U/L (ref 10–35)
Albumin: 4.1 g/dL (ref 3.6–5.1)
Alkaline phosphatase (APISO): 67 U/L (ref 37–153)
BUN: 18 mg/dL (ref 7–25)
CO2: 29 mmol/L (ref 20–32)
Calcium: 9.7 mg/dL (ref 8.6–10.4)
Chloride: 103 mmol/L (ref 98–110)
Creat: 0.94 mg/dL (ref 0.50–0.99)
GFR, Est African American: 72 mL/min/{1.73_m2} (ref >=60)
GFR, Est Non African American: 62 mL/min/{1.73_m2} (ref >=60)
Globulin: 2.8 g/dL (calc) (ref 1.9–3.7)
Glucose, Bld: 90 mg/dL (ref 65–99)
Potassium: 4.4 mmol/L (ref 3.5–5.3)
Sodium: 142 mmol/L (ref 135–146)
Total Bilirubin: 0.4 mg/dL (ref 0.2–1.2)
Total Protein: 6.9 g/dL (ref 6.1–8.1)

## 2019-08-01 LAB — HEMOGLOBIN A1C
Hgb A1c MFr Bld: 5.8 % of total Hgb — ABNORMAL HIGH (ref <5.7)
Mean Plasma Glucose: 120 (calc)
eAG (mmol/L): 6.6 (calc)

## 2019-08-01 LAB — LIPID PANEL
Cholesterol: 257 mg/dL — ABNORMAL HIGH (ref <200)
HDL: 47 mg/dL — ABNORMAL LOW (ref >=50)
LDL Cholesterol (Calc): 165 mg/dL (calc) — ABNORMAL HIGH
Non-HDL Cholesterol (Calc): 210 mg/dL (calc) — ABNORMAL HIGH (ref <130)
Total CHOL/HDL Ratio: 5.5 (calc) — ABNORMAL HIGH (ref <5.0)
Triglycerides: 274 mg/dL — ABNORMAL HIGH (ref <150)

## 2019-08-05 ENCOUNTER — Encounter: Payer: Self-pay | Admitting: Family Medicine

## 2019-08-07 ENCOUNTER — Other Ambulatory Visit: Payer: Medicare HMO

## 2019-08-15 ENCOUNTER — Ambulatory Visit (INDEPENDENT_AMBULATORY_CARE_PROVIDER_SITE_OTHER): Payer: Medicare HMO | Admitting: Family Medicine

## 2019-08-15 ENCOUNTER — Other Ambulatory Visit: Payer: Self-pay

## 2019-08-15 ENCOUNTER — Encounter: Payer: Self-pay | Admitting: Family Medicine

## 2019-08-15 VITALS — BP 122/82 | HR 65 | Temp 98.1°F | Resp 14 | Ht 65.0 in | Wt 221.7 lb

## 2019-08-15 DIAGNOSIS — R002 Palpitations: Secondary | ICD-10-CM

## 2019-08-15 DIAGNOSIS — I10 Essential (primary) hypertension: Secondary | ICD-10-CM | POA: Diagnosis not present

## 2019-08-15 DIAGNOSIS — Z8249 Family history of ischemic heart disease and other diseases of the circulatory system: Secondary | ICD-10-CM | POA: Diagnosis not present

## 2019-08-15 DIAGNOSIS — E7849 Other hyperlipidemia: Secondary | ICD-10-CM

## 2019-08-15 NOTE — Progress Notes (Signed)
Name: Katherine Gould   MRN: [id number]    DOB: 11-Jan-1951   Date:08/15/2019       Progress Note  Chief Complaint  Patient presents with  . Follow-up    abnormal labs  . Hyperlipidemia    not on chol. med and never started refused at the time     Subjective:   Katherine Gould is a 69 y.o. female, presents to clinic for routine follow up on the conditions listed above.  Hyperlipidemia: Current Medication Regimen:   Very strict diet efforts and did not want to try statin Last Lipids: Lab Results  Component Value Date   CHOL 257 (H) 07/31/2019   HDL 47 (L) 07/31/2019   LDLCALC 165 (H) 07/31/2019   TRIG 274 (H) 07/31/2019   CHOLHDL 5.5 (H) 07/31/2019   - Denies:  shortness of breath, myalgias. - Risk factors for atherosclerosis: hypercholesterolemia and hypertension The 10-year ASCVD risk score Mikey Bussing DC Jr., et al., 2013) is: 10.7%   Values used to calculate the score:     Age: 73 years     Sex: Female     Is Non-Hispanic African American: No     Diabetic: No     Tobacco smoker: No     Systolic Blood Pressure: 123XX123 mmHg     Is BP treated: Yes     HDL Cholesterol: 47 mg/dL     Total Cholesterol: 257 mg/dL    Hypertension:  Currently managed on bisoprolol-HCTZ 10-6.25 Pt reports good med compliance but she does take midday She denies any med SE.   No lightheadedness, hypotension, syncope. Blood pressure today is well controlled BP Readings from Last 3 Encounters:  08/15/19 122/82  07/31/19 138/88  06/24/19 136/86   Pt denies SOB, exertional sx, LE edema, palpitation, Ha's, visual disturbances Dietary efforts for BP?  No meat, a lot of veggies  She feels like something is not right and she wants a referral to cardiologist.  She still does not want to be on meds, she believes in being as holistic as she can .  She wants additional testing done on her lipids.    Moods/anxiety, still seeing psychiatrist, still severely and frequently symptomatic with going on and off  effexor and cymbalta, tearful and upset in office.  Patient has history of obesity and morbid obesity with comorbidities osteoarthritis, struct of sleep apnea, hypertension, hyperlipidemia and prediabetes      Patient Active Problem List   Diagnosis Date Noted  . Postmenopausal estrogen deficiency 04/08/2019  . Stage 3a chronic kidney disease 04/08/2019  . GERD (gastroesophageal reflux disease) 12/10/2018  . Anxiety and depression 04/15/2018  . OCD (obsessive compulsive disorder) 04/15/2018  . Morbid obesity ([place]) 04/15/2018  . Medication monitoring encounter 04/15/2018  . Vitamin B12 deficiency 04/15/2018  . Vitamin D deficiency 04/15/2018  . Bilateral primary osteoarthritis of knee 04/15/2018  . Interstitial cystitis 04/15/2018  . OSA (obstructive sleep apnea) 07/14/2015  . Hypertension   . Hyperlipidemia 07/03/2012  . Prediabetes 07/03/2012    Past Surgical History:  Procedure Laterality Date  . LASER ABLATION CONDYLOMA CERVICAL / VULVAR      Family History  Problem Relation Age of Onset  . Heart failure Mother   . Aortic stenosis Mother   . Hypertension Mother   . Stroke Mother   . Glaucoma Mother   . Heart attack Father   . Hypertension Father   . Heart attack Paternal Grandfather   . Glaucoma Sister   . Hypertension  Sister   . Atrial fibrillation Sister   . Heart failure Sister   . Anxiety disorder Sister     Social History   Tobacco Use  . Smoking status: Never Smoker  . Smokeless tobacco: Never Used  Substance Use Topics  . Alcohol use: No  . Drug use: No      Current Outpatient Medications:  .  bisoprolol-hydrochlorothiazide (ZIAC) 10-6.25 MG tablet, Take 1 tablet by mouth daily., Disp: 90 tablet, Rfl: 1 .  Cholecalciferol (VITAMIN D3) 2000 UNITS capsule, Take 2,000 Units by mouth daily. Pt taking 4000 IU daily, Disp: , Rfl:  .  clonazePAM (KLONOPIN) 1 MG tablet, Take 1 mg by mouth 2 (two) times daily., Disp: , Rfl:  .  famotidine (PEPCID) 20  MG tablet, Take 1 tablet (20 mg total) by mouth 2 (two) times daily., Disp: 180 tablet, Rfl: 1 .  Turmeric 500 MG CAPS, Take by mouth., Disp: , Rfl:  .  vitamin B-12 (CYANOCOBALAMIN) 1000 MCG tablet, Take 1,000 mcg by mouth daily., Disp: , Rfl:  .  atorvastatin (LIPITOR) 20 MG tablet, Take 1 tablet (20 mg total) by mouth at bedtime. (Patient not taking: Reported on 04/24/2019), Disp: 90 tablet, Rfl: 3  Allergies  Allergen Reactions  . Aripiprazole Other (See Comments)    Incubus Dream tremmors   . Quetiapine Other (See Comments)    Incubus dream    Chart Review Today: I personally reviewed active problem list, medication list, allergies, family history, social history, health maintenance, notes from last encounter, lab results, imaging with the patient/caregiver today.   Review of Systems  10 Systems reviewed and are negative for acute change except as noted in the HPI.  Objective:    Vitals:   08/15/19 1421  BP: 122/82  Pulse: 65  Resp: 14  Temp: 98.1 F (36.7 C)  SpO2: 95%  Weight: 221 lb 11.2 oz (100.6 kg)  Height: 5\' 5"  (1.651 m)    Body mass index is 36.89 kg/m.  Physical Exam Vitals and nursing note reviewed.  Constitutional:      General: She is not in acute distress.    Appearance: Normal appearance. She is well-developed. She is obese. She is not ill-appearing, toxic-appearing or diaphoretic.     Interventions: Face mask in place.  HENT:     Head: Normocephalic and atraumatic.     Right Ear: External ear normal.     Left Ear: External ear normal.  Eyes:     General: Lids are normal. No scleral icterus.       Right eye: No discharge.        Left eye: No discharge.     Conjunctiva/sclera: Conjunctivae normal.  Neck:     Trachea: Phonation normal. No tracheal deviation.  Cardiovascular:     Rate and Rhythm: Normal rate and regular rhythm.     Pulses: Normal pulses.     Heart sounds: Normal heart sounds. No murmur. No friction rub. No gallop.     Pulmonary:     Effort: Pulmonary effort is normal. No respiratory distress.     Breath sounds: Normal breath sounds. No stridor. No wheezing, rhonchi or rales.  Chest:     Chest wall: No tenderness.  Abdominal:     General: Bowel sounds are normal. There is no distension.     Palpations: Abdomen is soft.     Tenderness: There is no abdominal tenderness. There is no guarding or rebound.  Musculoskeletal:  General: No deformity. Normal range of motion.     Cervical back: Normal range of motion and neck supple.  Lymphadenopathy:     Cervical: No cervical adenopathy.  Skin:    General: Skin is warm and dry.     Capillary Refill: Capillary refill takes less than 2 seconds.     Coloration: Skin is not jaundiced or pale.     Findings: No rash.  Neurological:     Mental Status: She is alert. Mental status is at baseline.     Motor: Tremor present. No abnormal muscle tone.     Gait: Gait normal.  Psychiatric:        Mood and Affect: Mood is anxious and depressed. Affect is tearful.       PHQ2/9: Depression screen [facility] 2/9 08/15/2019 07/31/2019 06/24/2019 04/24/2019 03/24/2019  Decreased Interest 3 0 3 3 1   Down, Depressed, Hopeless 3 3 3 3 2   PHQ - 2 Score 6 3 6 6 3   Altered sleeping 3 0 1 3 0  Tired, decreased energy 3 0 1 3 0  Change in appetite 0 0 0 3 0  Feeling bad or failure about yourself  0 0 0 3 0  Trouble concentrating 0 0 0 3 0  Moving slowly or fidgety/restless 0 0 0 3 0  Suicidal thoughts 0 0 0 3 0  PHQ-9 Score 12 3 8 27 3   Difficult doing work/chores Somewhat difficult Somewhat difficult Not difficult at all Somewhat difficult Somewhat difficult    phq 9 is positive, patient is seeing specialist  Fall Risk: Fall Risk  08/15/2019 07/31/2019 06/24/2019 04/24/2019 04/24/2019  Falls in the past year? 0 0 0 1 1  Number falls in past yr: 0 0 0 1 1  Injury with Fall? 0 0 0 0 0  Risk for fall due to : - - - - History of fall(s)  Follow up - - - Falls prevention discussed  Falls prevention discussed    Functional Status Survey: Is the patient deaf or have difficulty hearing?: No Does the patient have difficulty seeing, even when wearing glasses/contacts?: No Does the patient have difficulty concentrating, remembering, or making decisions?: No Does the patient have difficulty walking or climbing stairs?: No Does the patient have difficulty dressing or bathing?: No Does the patient have difficulty doing errands alone such as visiting a doctor's office or shopping?: No   Assessment & Plan:     ICD-10-CM   1. Familial hyperlipidemia  E78.49 Ambulatory referral to Cardiology   Patient has worked on dietary efforts, has very elevated cholesterol but refuses statin and is very hesitant to request cardiology referral  2. Palpitations  R00.2 Ambulatory referral to Cardiology   At the end of the visit very tearfully she mentions something does not feel right in her body has not palpitations but similar and she wants cardiology eval  3. Family history of heart disease  Z82.49 Ambulatory referral to Cardiology   Significant family history of heart disease, very elevated lipids, no improvement with dietary efforts  4. Morbid obesity ([facility]) Chronic E66.01    comorbidities osteoarthritis, struct of sleep apnea, hypertension, hyperlipidemia and prediabetes  5. Essential hypertension  I10    Stable, well controlled today   The very end of the visit the patient did finally agree to try a statin medication, we have talked about this multiple times over the past several months she has been given information many times on the medications and how they work  we have discussed the ample benefits and minimal side effects that she may or may not experience.  She finally did agree to try them but she had also previously requested a cardiology referral for feeling that something is "not right"  Patient has a history of noncompliance she is very tearful agitated at her baseline she  continues to change her medications psychiatric and otherwise and unfortunately does not seem that she is been able to get her mental health to a stable or improved state.  I had previously prescribed Lipitor 20 mg about 5 to 6 months ago, she states that she will get it from the pharmacy and try the medications we encouraged her to do a 65-month follow-up  Return for return in 3 months for HLD recheck, complete fasting labs before visit.   Katherine Grana, PA-C 08/15/19 2:27 PM

## 2019-08-15 NOTE — Patient Instructions (Addendum)
Lab Results  Component Value Date   CHOL 257 (H) 07/31/2019   CHOL 220 (H) 04/09/2019   CHOL 248 (H) 12/10/2018   Lab Results  Component Value Date   HDL 47 (L) 07/31/2019   HDL 53 04/09/2019   HDL 49 (L) 12/10/2018   Lab Results  Component Value Date   LDLCALC 165 (H) 07/31/2019   LDLCALC 144 (H) 04/09/2019   LDLCALC 165 (H) 12/10/2018   Lab Results  Component Value Date   TRIG 274 (H) 07/31/2019   TRIG 112 04/09/2019   TRIG 181 (H) 12/10/2018   Lab Results  Component Value Date   CHOLHDL 5.5 (H) 07/31/2019   CHOLHDL 4.2 04/09/2019   CHOLHDL 5.1 (H) 12/10/2018   No results found for: LDLDIRECT  The 10-year ASCVD risk score Mikey Bussing DC Jr., et al., 2013) is: 10.7%   Values used to calculate the score:     Age: 69 years     Sex: Female     Is Non-Hispanic African American: No     Diabetic: No     Tobacco smoker: No     Systolic Blood Pressure: 123XX123 mmHg     Is BP treated: Yes     HDL Cholesterol: 47 mg/dL     Total Cholesterol: 257 mg/dL      Lab Results  Component Value Date   CREATININE 0.94 07/31/2019   Lab Results  Component Value Date   BUN 18 07/31/2019   Lab Results  Component Value Date   GFRNONAA 62 07/31/2019   Kidney function is great and so is your blood pressure.

## 2019-08-21 DIAGNOSIS — E7849 Other hyperlipidemia: Secondary | ICD-10-CM | POA: Insufficient documentation

## 2019-09-02 ENCOUNTER — Telehealth: Payer: Self-pay

## 2019-09-02 DIAGNOSIS — G4733 Obstructive sleep apnea (adult) (pediatric): Secondary | ICD-10-CM | POA: Diagnosis not present

## 2019-09-02 DIAGNOSIS — I10 Essential (primary) hypertension: Secondary | ICD-10-CM | POA: Diagnosis not present

## 2019-09-02 DIAGNOSIS — R079 Chest pain, unspecified: Secondary | ICD-10-CM | POA: Diagnosis not present

## 2019-09-02 DIAGNOSIS — E782 Mixed hyperlipidemia: Secondary | ICD-10-CM | POA: Diagnosis not present

## 2019-09-02 DIAGNOSIS — R69 Illness, unspecified: Secondary | ICD-10-CM | POA: Diagnosis not present

## 2019-09-02 DIAGNOSIS — N1831 Chronic kidney disease, stage 3a: Secondary | ICD-10-CM | POA: Diagnosis not present

## 2019-09-02 NOTE — Telephone Encounter (Signed)
Labs mailed to patient's home per her request.   Copied from [place] [phone]. Topic: General - Inquiry >> Aug 20, 2019  2:29 PM Scherrie Gerlach wrote: Reason for CRM: pt would like a copy of her lab results mailed to her home. Pt does not do mychart. Address confirmed >> Aug 29, 2019  8:35 AM Scherrie Gerlach wrote: Pt has not received her labs in the mail and wants to make sure they were indeed mailed to her.  Can you confirm?

## 2019-09-25 ENCOUNTER — Other Ambulatory Visit: Payer: Self-pay | Admitting: Family Medicine

## 2019-09-25 DIAGNOSIS — I10 Essential (primary) hypertension: Secondary | ICD-10-CM

## 2019-11-07 DIAGNOSIS — R69 Illness, unspecified: Secondary | ICD-10-CM | POA: Diagnosis not present

## 2019-11-07 DIAGNOSIS — F411 Generalized anxiety disorder: Secondary | ICD-10-CM | POA: Diagnosis not present

## 2019-11-12 ENCOUNTER — Other Ambulatory Visit: Payer: Self-pay

## 2019-11-12 DIAGNOSIS — E782 Mixed hyperlipidemia: Secondary | ICD-10-CM | POA: Diagnosis not present

## 2019-11-12 DIAGNOSIS — R7303 Prediabetes: Secondary | ICD-10-CM | POA: Diagnosis not present

## 2019-11-12 DIAGNOSIS — I10 Essential (primary) hypertension: Secondary | ICD-10-CM | POA: Diagnosis not present

## 2019-11-12 DIAGNOSIS — Z5181 Encounter for therapeutic drug level monitoring: Secondary | ICD-10-CM | POA: Diagnosis not present

## 2019-11-12 DIAGNOSIS — R300 Dysuria: Secondary | ICD-10-CM

## 2019-11-12 DIAGNOSIS — N1831 Chronic kidney disease, stage 3a: Secondary | ICD-10-CM | POA: Diagnosis not present

## 2019-11-12 LAB — COMPLETE METABOLIC PANEL WITH GFR
AG Ratio: 1.7 (calc) (ref 1.0–2.5)
ALT: 7 U/L (ref 6–29)
AST: 11 U/L (ref 10–35)
Albumin: 4.1 g/dL (ref 3.6–5.1)
Alkaline phosphatase (APISO): 65 U/L (ref 37–153)
BUN: 11 mg/dL (ref 7–25)
CO2: 27 mmol/L (ref 20–32)
Calcium: 9.1 mg/dL (ref 8.6–10.4)
Chloride: 108 mmol/L (ref 98–110)
Creat: 0.92 mg/dL (ref 0.50–0.99)
GFR, Est African American: 74 mL/min/{1.73_m2} (ref >=60)
GFR, Est Non African American: 64 mL/min/{1.73_m2} (ref >=60)
Globulin: 2.4 g/dL (calc) (ref 1.9–3.7)
Glucose, Bld: 106 mg/dL — ABNORMAL HIGH (ref 65–99)
Potassium: 4 mmol/L (ref 3.5–5.3)
Sodium: 143 mmol/L (ref 135–146)
Total Bilirubin: 0.5 mg/dL (ref 0.2–1.2)
Total Protein: 6.5 g/dL (ref 6.1–8.1)

## 2019-11-12 LAB — LIPID PANEL
Cholesterol: 213 mg/dL — ABNORMAL HIGH (ref <200)
HDL: 44 mg/dL — ABNORMAL LOW (ref >=50)
LDL Cholesterol (Calc): 142 mg/dL (calc) — ABNORMAL HIGH
Non-HDL Cholesterol (Calc): 169 mg/dL (calc) — ABNORMAL HIGH (ref <130)
Total CHOL/HDL Ratio: 4.8 (calc) (ref <5.0)
Triglycerides: 142 mg/dL (ref <150)

## 2019-11-13 DIAGNOSIS — R69 Illness, unspecified: Secondary | ICD-10-CM | POA: Diagnosis not present

## 2019-11-13 DIAGNOSIS — F410 Panic disorder [episodic paroxysmal anxiety] without agoraphobia: Secondary | ICD-10-CM | POA: Diagnosis not present

## 2019-11-13 LAB — URINE CULTURE
MICRO NUMBER:: 10760580
SPECIMEN QUALITY:: ADEQUATE

## 2019-11-14 ENCOUNTER — Encounter: Payer: Self-pay | Admitting: Family Medicine

## 2019-11-14 ENCOUNTER — Ambulatory Visit (INDEPENDENT_AMBULATORY_CARE_PROVIDER_SITE_OTHER): Payer: Medicare HMO | Admitting: Family Medicine

## 2019-11-14 ENCOUNTER — Other Ambulatory Visit: Payer: Self-pay

## 2019-11-14 ENCOUNTER — Encounter: Payer: Medicare HMO | Admitting: Family Medicine

## 2019-11-14 VITALS — BP 150/100 | HR 73 | Temp 95.5°F | Resp 16 | Ht 65.0 in | Wt 216.0 lb

## 2019-11-14 DIAGNOSIS — I10 Essential (primary) hypertension: Secondary | ICD-10-CM | POA: Diagnosis not present

## 2019-11-14 DIAGNOSIS — F901 Attention-deficit hyperactivity disorder, predominantly hyperactive type: Secondary | ICD-10-CM

## 2019-11-14 DIAGNOSIS — F332 Major depressive disorder, recurrent severe without psychotic features: Secondary | ICD-10-CM | POA: Diagnosis not present

## 2019-11-14 DIAGNOSIS — M7542 Impingement syndrome of left shoulder: Secondary | ICD-10-CM | POA: Diagnosis not present

## 2019-11-14 DIAGNOSIS — M170 Bilateral primary osteoarthritis of knee: Secondary | ICD-10-CM

## 2019-11-14 DIAGNOSIS — F411 Generalized anxiety disorder: Secondary | ICD-10-CM | POA: Diagnosis not present

## 2019-11-14 DIAGNOSIS — R69 Illness, unspecified: Secondary | ICD-10-CM | POA: Diagnosis not present

## 2019-11-14 DIAGNOSIS — F424 Excoriation (skin-picking) disorder: Secondary | ICD-10-CM

## 2019-11-14 NOTE — Progress Notes (Deleted)
Name: Katherine Gould   MRN: [id number]    DOB: 1950-12-05   Date:11/14/2019       Progress Note  Subjective  Chief Complaint  Chief Complaint  Patient presents with  . Hyperlipidemia    3 month follow up    I connected with  [name] on 11/14/19 at  1:40 PM EDT by telephone and verified that I am speaking with the correct person using two identifiers.  I discussed the limitations, risks, security and privacy concerns of performing an evaluation and management service by telephone and the availability of in person appointments. Staff also discussed with the patient that there may be a patient responsible charge related to this service. Patient Location: *** Provider Location: [place] at 12:46 and 12:51 no answer HPI  *** Patient Active Problem List   Diagnosis Date Noted  . Familial hyperlipidemia 08/21/2019  . Postmenopausal estrogen deficiency 04/08/2019  . Stage 3a chronic kidney disease 04/08/2019  . GERD (gastroesophageal reflux disease) 12/10/2018  . Anxiety and depression 04/15/2018  . OCD (obsessive compulsive disorder) 04/15/2018  . Morbid obesity ([place]) 04/15/2018  . Medication monitoring encounter 04/15/2018  . Vitamin B12 deficiency 04/15/2018  . Vitamin D deficiency 04/15/2018  . Bilateral primary osteoarthritis of knee 04/15/2018  . Interstitial cystitis 04/15/2018  . OSA (obstructive sleep apnea) 07/14/2015  . Hypertension   . Hyperlipidemia 07/03/2012  . Prediabetes 07/03/2012    Past Surgical History:  Procedure Laterality Date  . LASER ABLATION CONDYLOMA CERVICAL / VULVAR      Family History  Problem Relation Age of Onset  . Heart failure Mother   . Aortic stenosis Mother   . Hypertension Mother   . Stroke Mother   . Glaucoma Mother   . Heart attack Father   . Hypertension Father   . Heart attack Paternal Grandfather   . Glaucoma Sister   . Hypertension Sister   . Atrial fibrillation Sister   . Heart failure Sister   . Anxiety  disorder Sister     Social History   Socioeconomic History  . Marital status: Divorced    Spouse name: Not on file  . Number of children: 1  . Years of education: 74  . Highest education level: High school graduate  Occupational History  . Occupation: disability  Tobacco Use  . Smoking status: Never Smoker  . Smokeless tobacco: Never Used  Vaping Use  . Vaping Use: Never used  Substance and Sexual Activity  . Alcohol use: No  . Drug use: No  . Sexual activity: Not Currently  Other Topics Concern  . Not on file  Social History Narrative   Pt lives alone.    Social Determinants of Health   Financial Resource Strain: Low Risk   . Difficulty of Paying Living Expenses: Not hard at all  Food Insecurity: No Food Insecurity  . Worried About Charity fundraiser in the Last Year: Never true  . Ran Out of Food in the Last Year: Never true  Transportation Needs: No Transportation Needs  . Lack of Transportation (Medical): No  . Lack of Transportation (Non-Medical): No  Physical Activity: Inactive  . Days of Exercise per Week: 0 days  . Minutes of Exercise per Session: 0 min  Stress: Stress Concern Present  . Feeling of Stress : Very much  Social Connections: Socially Isolated  . Frequency of Communication with Friends and Family: Once a week  . Frequency of Social Gatherings with Friends and  Family: Once a week  . Attends Religious Services: Never  . Active Member of Clubs or Organizations: No  . Attends Archivist Meetings: Never  . Marital Status: Divorced  Human resources officer Violence: Not At Risk  . Fear of Current or Ex-Partner: No  . Emotionally Abused: No  . Physically Abused: No  . Sexually Abused: No     Current Outpatient Medications:  .  bisoprolol-hydrochlorothiazide (ZIAC) 10-6.25 MG tablet, Take 1 tablet by mouth daily., Disp: 90 tablet, Rfl: 1 .  Cholecalciferol (VITAMIN D3) 2000 UNITS capsule, Take 2,000 Units by mouth daily. Pt taking 4000 IU  daily, Disp: , Rfl:  .  clonazePAM (KLONOPIN) 1 MG tablet, Take 1 mg by mouth 2 (two) times daily., Disp: , Rfl:  .  famotidine (PEPCID) 20 MG tablet, Take 1 tablet (20 mg total) by mouth 2 (two) times daily., Disp: 180 tablet, Rfl: 1 .  Turmeric 500 MG CAPS, Take by mouth., Disp: , Rfl:  .  vitamin B-12 (CYANOCOBALAMIN) 1000 MCG tablet, Take 1,000 mcg by mouth daily., Disp: , Rfl:   Allergies  Allergen Reactions  . Aripiprazole Other (See Comments)    Incubus Dream tremmors   . Quetiapine Other (See Comments)    Incubus dream    I personally reviewed {Reviewed:14835} with the patient/caregiver today.   ROS ***  Objective  Virtual encounter, vitals not obtained.  There is no height or weight on file to calculate BMI.  Physical Exam ***  Results for orders placed or performed in visit on 11/12/19 (from the past 72 hour(s))  Urine Culture     Status: None   Collection Time: 11/12/19 10:37 AM   Specimen: Urine  Result Value Ref Range   MICRO NUMBER: [id number]    SPECIMEN QUALITY: Adequate    Sample Source URINE    STATUS: FINAL    ISOLATE 1:      Growth of mixed flora was isolated, suggesting probable contamination. No further testing will be performed. If clinically indicated, recollection using a method to minimize contamination, with prompt transfer to Urine Culture Transport Tube, is  recommended.     PHQ2/9: Depression screen [facility] 2/9 11/14/2019 08/15/2019 07/31/2019 06/24/2019 04/24/2019  Decreased Interest 0 3 0 3 3  Down, Depressed, Hopeless 0 3 3 3 3   PHQ - 2 Score 0 6 3 6 6   Altered sleeping 0 3 0 1 3  Tired, decreased energy 0 3 0 1 3  Change in appetite 0 0 0 0 3  Feeling bad or failure about yourself  0 0 0 0 3  Trouble concentrating 0 0 0 0 3  Moving slowly or fidgety/restless 0 0 0 0 3  Suicidal thoughts 0 0 0 0 3  PHQ-9 Score 0 12 3 8 27   Difficult doing work/chores - Somewhat difficult Somewhat difficult Not difficult at all Somewhat difficult  Some  recent data might be hidden   PHQ-2/9 Result is {gen negative/positive:315881}.    Fall Risk: Fall Risk  11/14/2019 08/15/2019 07/31/2019 06/24/2019 04/24/2019  Falls in the past year? 0 0 0 0 1  Number falls in past yr: 0 0 0 0 1  Injury with Fall? 0 0 0 0 0  Risk for fall due to : - - - - -  Follow up - - - - Falls prevention discussed   ***  Assessment & Plan  There are no diagnoses linked to this encounter.  I discussed the assessment and treatment plan with the  patient. The patient was provided an opportunity to ask questions and all were answered. The patient agreed with the plan and demonstrated an understanding of the instructions.   The patient was advised to call back or seek an in-person evaluation if the symptoms worsen or if the condition fails to improve as anticipated.  I provided *** minutes of non-face-to-face time during this encounter.  Loistine Chance, MD
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
## 2019-11-14 NOTE — Progress Notes (Deleted)
Name: Katherine Gould   MRN: [id number]    DOB: [birth year]   Date:11/14/2019       Progress Note  Subjective  Chief Complaint  Chief Complaint  Patient presents with  . Hyperlipidemia    3 month follow up    I connected with  [name] on 11/14/19 at  1:40 PM EDT by telephone and verified that I am speaking with the correct person using two identifiers.  I discussed the limitations, risks, security and privacy concerns of performing an evaluation and management service by telephone and the availability of in person appointments. Staff also discussed with the patient that there may be a patient responsible charge related to this service. Patient Location: *** Provider Location: [place] at 12:46 and 12:51 no answer HPI  *** Patient Active Problem List   Diagnosis Date Noted  . Severe episode of recurrent major depressive disorder, without psychotic features ([place]) 11/14/2019  . Habitual self-excoriation 11/14/2019  . GAD (generalized anxiety disorder) 11/14/2019  . Attention deficit hyperactivity disorder (ADHD), predominantly hyperactive type 11/14/2019  . Familial hyperlipidemia 08/21/2019  . Postmenopausal estrogen deficiency 04/08/2019  . GERD (gastroesophageal reflux disease) 12/10/2018  . OCD (obsessive compulsive disorder) 04/15/2018  . Morbid obesity ([place]) 04/15/2018  . Medication monitoring encounter 04/15/2018  . Vitamin B12 deficiency 04/15/2018  . Vitamin D deficiency 04/15/2018  . Bilateral primary osteoarthritis of knee 04/15/2018  . Interstitial cystitis 04/15/2018  . OSA (obstructive sleep apnea) 07/14/2015  . Hypertension   . Hyperlipidemia 07/03/2012  . Prediabetes 07/03/2012    Past Surgical History:  Procedure Laterality Date  . LASER ABLATION CONDYLOMA CERVICAL / VULVAR      Family History  Problem Relation Age of Onset  . Heart failure Mother   . Aortic stenosis Mother   . Hypertension Mother   . Stroke Mother   . Glaucoma Mother   .  Heart attack Father   . Hypertension Father   . Heart attack Paternal Grandfather   . Glaucoma Sister   . Hypertension Sister   . Atrial fibrillation Sister   . Heart failure Sister   . Anxiety disorder Sister     Social History   Socioeconomic History  . Marital status: Divorced    Spouse name: Not on file  . Number of children: 1  . Years of education: 48  . Highest education level: High school graduate  Occupational History  . Occupation: disability  Tobacco Use  . Smoking status: Never Smoker  . Smokeless tobacco: Never Used  Vaping Use  . Vaping Use: Never used  Substance and Sexual Activity  . Alcohol use: No  . Drug use: No  . Sexual activity: Not Currently  Other Topics Concern  . Not on file  Social History Narrative   Pt lives alone.    Social Determinants of Health   Financial Resource Strain: Low Risk   . Difficulty of Paying Living Expenses: Not hard at all  Food Insecurity: No Food Insecurity  . Worried About Charity fundraiser in the Last Year: Never true  . Ran Out of Food in the Last Year: Never true  Transportation Needs: No Transportation Needs  . Lack of Transportation (Medical): No  . Lack of Transportation (Non-Medical): No  Physical Activity: Inactive  . Days of Exercise per Week: 0 days  . Minutes of Exercise per Session: 0 min  Stress: Stress Concern Present  . Feeling of Stress : Very much  Social Connections:  Socially Isolated  . Frequency of Communication with Friends and Family: Once a week  . Frequency of Social Gatherings with Friends and Family: Once a week  . Attends Religious Services: Never  . Active Member of Clubs or Organizations: No  . Attends Archivist Meetings: Never  . Marital Status: Divorced  Human resources officer Violence: Not At Risk  . Fear of Current or Ex-Partner: No  . Emotionally Abused: No  . Physically Abused: No  . Sexually Abused: No     Current Outpatient Medications:  .   bisoprolol-hydrochlorothiazide (ZIAC) 10-6.25 MG tablet, Take 1 tablet by mouth daily., Disp: 90 tablet, Rfl: 1 .  Cholecalciferol (VITAMIN D3) 2000 UNITS capsule, Take 2,000 Units by mouth daily. Pt taking 4000 IU daily, Disp: , Rfl:  .  clonazePAM (KLONOPIN) 1 MG tablet, Take 1 mg by mouth 2 (two) times daily., Disp: , Rfl:  .  famotidine (PEPCID) 20 MG tablet, Take 1 tablet (20 mg total) by mouth 2 (two) times daily., Disp: 180 tablet, Rfl: 1 .  Turmeric 500 MG CAPS, Take by mouth., Disp: , Rfl:  .  vitamin B-12 (CYANOCOBALAMIN) 1000 MCG tablet, Take 1,000 mcg by mouth daily., Disp: , Rfl:  .  venlafaxine XR (EFFEXOR-XR) 75 MG 24 hr capsule, Take 75 mg by mouth daily., Disp: , Rfl:   Allergies  Allergen Reactions  . Aripiprazole Other (See Comments)    Incubus Dream tremmors   . Quetiapine Other (See Comments)    Incubus dream    I personally reviewed {Reviewed:14835} with the patient/caregiver today.   ROS ***  Objective  Virtual encounter, vitals not obtained.  Body mass index is 35.94 kg/m.  Physical Exam ***  Results for orders placed or performed in visit on 11/12/19 (from the past 72 hour(s))  Urine Culture     Status: None   Collection Time: 11/12/19 10:37 AM   Specimen: Urine  Result Value Ref Range   MICRO NUMBER: [id number]    SPECIMEN QUALITY: Adequate    Sample Source URINE    STATUS: FINAL    ISOLATE 1:      Growth of mixed flora was isolated, suggesting probable contamination. No further testing will be performed. If clinically indicated, recollection using a method to minimize contamination, with prompt transfer to Urine Culture Transport Tube, is  recommended.     PHQ2/9: Depression screen [facility] 2/9 11/14/2019 08/15/2019 07/31/2019 06/24/2019 04/24/2019  Decreased Interest 3 3 0 3 3  Down, Depressed, Hopeless 3 3 3 3 3   PHQ - 2 Score 6 6 3 6 6   Altered sleeping 3 3 0 1 3  Tired, decreased energy 3 3 0 1 3  Change in appetite 3 0 0 0 3  Feeling bad or  failure about yourself  3 0 0 0 3  Trouble concentrating 3 0 0 0 3  Moving slowly or fidgety/restless 3 0 0 0 3  Suicidal thoughts 3 0 0 0 3  PHQ-9 Score 27 12 3 8 27   Difficult doing work/chores Somewhat difficult Somewhat difficult Somewhat difficult Not difficult at all Somewhat difficult  Some recent data might be hidden   PHQ-2/9 Result is {gen negative/positive:315881}.    Fall Risk: Fall Risk  11/14/2019 08/15/2019 07/31/2019 06/24/2019 04/24/2019  Falls in the past year? 0 0 0 0 1  Number falls in past yr: 0 0 0 0 1  Injury with Fall? 0 0 0 0 0  Risk for fall due to : - - - - -  Follow up - - - - Falls prevention discussed   ***  Assessment & Plan  There are no diagnoses linked to this encounter.  I discussed the assessment and treatment plan with the patient. The patient was provided an opportunity to ask questions and all were answered. The patient agreed with the plan and demonstrated an understanding of the instructions.   The patient was advised to call back or seek an in-person evaluation if the symptoms worsen or if the condition fails to improve as anticipated.  I provided *** minutes of non-face-to-face time during this encounter.  Loistine Chance, MD

## 2019-11-17 ENCOUNTER — Other Ambulatory Visit: Payer: Self-pay | Admitting: Family Medicine

## 2019-11-17 DIAGNOSIS — I10 Essential (primary) hypertension: Secondary | ICD-10-CM

## 2019-12-05 ENCOUNTER — Telehealth: Payer: Self-pay

## 2019-12-05 ENCOUNTER — Ambulatory Visit: Payer: Medicare HMO

## 2019-12-05 ENCOUNTER — Other Ambulatory Visit: Payer: Self-pay

## 2019-12-05 VITALS — BP 136/86 | HR 82

## 2019-12-05 DIAGNOSIS — I10 Essential (primary) hypertension: Secondary | ICD-10-CM

## 2019-12-05 NOTE — Telephone Encounter (Signed)
Pt states has not heard from PT at [facility], she is checking status of referral?

## 2019-12-05 NOTE — Telephone Encounter (Signed)
According to the note in the referral, they tried to call her. Their protocal is to try 3 times then mail a letter and close the referral. They can be reached at [phone].

## 2019-12-10 NOTE — Telephone Encounter (Signed)
Left detailed vm °

## 2019-12-12 DIAGNOSIS — R69 Illness, unspecified: Secondary | ICD-10-CM | POA: Diagnosis not present

## 2019-12-15 ENCOUNTER — Telehealth: Payer: Self-pay | Admitting: *Deleted

## 2019-12-15 NOTE — Chronic Care Management (AMB) (Signed)
  Chronic Care Management   Note  12/15/2019 Name: KENNEDEY DIGILIO MRN: [id number] DOB: [birth year]  KATRIA BOTTS is a 69 y.o. year old female who is a primary care patient of Delsa Grana, [place]. I reached out to [name] by phone today in response to a referral sent by Ms. Jackie Plum Vilar's health plan.     Ms. Burston was given information about Chronic Care Management services today including:  1. CCM service includes personalized support from designated clinical staff supervised by her physician, including individualized plan of care and coordination with other care providers 2. 24/7 contact phone numbers for assistance for urgent and routine care needs. 3. Service will only be billed when office clinical staff spend 20 minutes or more in a month to coordinate care. 4. Only one practitioner may furnish and bill the service in a calendar month. 5. The patient may stop CCM services at any time (effective at the end of the month) by phone call to the office staff. 6. The patient will be responsible for cost sharing (co-pay) of up to 20% of the service fee (after annual deductible is met).  Patient agreed to services and verbal consent obtained.   Follow up plan: Telephone appointment with care management team member scheduled for: 12/25/2019  [facility]  [place], [place] [address] Direct Dial: [email or web address] Website: [email or web address]

## 2019-12-19 DIAGNOSIS — R69 Illness, unspecified: Secondary | ICD-10-CM | POA: Diagnosis not present

## 2019-12-19 DIAGNOSIS — F411 Generalized anxiety disorder: Secondary | ICD-10-CM | POA: Diagnosis not present

## 2019-12-25 ENCOUNTER — Ambulatory Visit: Payer: Medicare HMO | Admitting: Pharmacist

## 2019-12-25 ENCOUNTER — Other Ambulatory Visit: Payer: Self-pay

## 2019-12-25 DIAGNOSIS — I10 Essential (primary) hypertension: Secondary | ICD-10-CM

## 2019-12-25 DIAGNOSIS — E782 Mixed hyperlipidemia: Secondary | ICD-10-CM

## 2019-12-25 NOTE — Chronic Care Management (AMB) (Signed)
Chronic Care Management Pharmacy  Name: DORIEN BESSENT  MRN: [id number] DOB: Oct 25, 1950   Chief Complaint/ HPI  [name],  69 y.o. , female presents for their Initial CCM visit with the clinical pharmacist via telephone due to COVID-19 Pandemic.  PCP : Delsa Grana, PA-C  Their chronic conditions include: HTN, HLD, GAD, morbid obesity  Office Visits: 7/30 HTN, [place], BP 150/100 P 73 Wt 216 BMI 35.9, LDL 142 refuses statins, multiple panic attacks, skips HTN med doses, pescaterian diet, lost 7#  Consult Visit: NA  Medications: Outpatient Encounter Medications as of 12/25/2019  Medication Sig  . bisoprolol-hydrochlorothiazide (ZIAC) 10-6.25 MG tablet TAKE 1 TABLET BY MOUTH EVERY DAY  . Cholecalciferol (VITAMIN D3) 2000 UNITS capsule Take 2,000 Units by mouth daily. Pt taking 4000 IU daily  . clonazePAM (KLONOPIN) 1 MG tablet Take 1 mg by mouth 2 (two) times daily.  . famotidine (PEPCID) 20 MG tablet Take 1 tablet (20 mg total) by mouth 2 (two) times daily. (Patient taking differently: Take 20 mg by mouth daily. )  . Misc Natural Products (GLUCOSAMINE CHOND COMPLEX/MSM) TABS Take 2 capsules by mouth daily.  . Probiotic Product ([facility] PROBIOTIC MAXIMUM STRENGTH) CAPS Take 1 capsule by mouth daily.  . Turmeric 500 MG CAPS Take 1,000 mg by mouth.   . venlafaxine XR (EFFEXOR-XR) 75 MG 24 hr capsule Take 75 mg by mouth daily.  . vitamin B-12 (CYANOCOBALAMIN) 1000 MCG tablet Take 1,000 mcg by mouth daily.   No facility-administered encounter medications on file as of 12/25/2019.      Financial Resource Strain: Low Risk   . Difficulty of Paying Living Expenses: Not hard at all   Current Diagnosis/Assessment:  Goals Addressed            This Visit's Progress   . Chronic Care Management       CARE PLAN ENTRY (see longitudinal plan of care for additional care plan information)  Current Barriers:  . Chronic Disease Management support, education, and care coordination needs  related to Hypertension, Hyperlipidemia, and Anxiety   Hypertension BP Readings from Last 3 Encounters:  12/05/19 136/86  11/14/19 (!) 150/100  08/15/19 122/82   . Pharmacist Clinical Goal(s): o Over the next 90 days, patient will work with PharmD and providers to maintain BP goal <140/90 . Current regimen:  o Bisoprolol-HCTZ 10-6.25 mg daily . Interventions: o None . Patient self care activities - Over the next 90 days, patient will: o Check BP weekly, document, and provide at future appointments o Ensure daily salt intake < 2300 mg/day  Hyperlipidemia Lab Results  Component Value Date/Time   LDLCALC 142 (H) 11/12/2019 10:22 AM   . Pharmacist Clinical Goal(s): o Over the next 90 days, patient will work with PharmD and providers to achieve LDL goal < 100 . Current regimen:  o None . Interventions: o None . Patient self care activities - Over the next 90 days, patient will: o Implement significant calorie and saturated fat reduction o Consider cholesterol medication as next step if LDL not below 100 in 3 months  Diabetes Lab Results  Component Value Date/Time   HGBA1C 5.8 (H) 07/31/2019 09:07 AM   HGBA1C 5.9 (H) 04/09/2019 12:00 AM   . Pharmacist Clinical Goal(s): o Over the next 90 days, patient will work with PharmD and providers to maintain A1c goal <7% . Current regimen:  o None . Interventions: o None . Patient self care activities - Over the next 90 days, patient will:  o Check blood sugar once daily, document, and provide at future appointments o Contact provider with any episodes of hypoglycemia  Medication management . Pharmacist Clinical Goal(s): o Over the next 90 days, patient will work with PharmD and providers to achieve optimal medication adherence . [facility] . Interventions o Comprehensive medication review performed. o Utilize [facility] for medication synchronization, packaging and delivery . Patient self care activities -  Over the next 90 days, patient will: o Focus on medication adherence by working with new pharmacy to ensure timely and accurate medication delivery o Take medications as prescribed o Report any questions or concerns to PharmD and/or provider(s)  Initial goal documentation       Hypertension   BP goal is:  <140/90  Office blood pressures are  BP Readings from Last 3 Encounters:  12/05/19 136/86  11/14/19 (!) 150/100  08/15/19 122/82   Patient checks BP at home infrequently Patient home BP readings are ranging: NA  Patient has failed these meds in the past: NA Patient is currently controlled on the following medications:  Darrin Luis 10-6.25mg  daily  We discussed: At goal Denies hypotension  Plan  Continue current medications   Hyperlipidemia   LDL goal < 100  Lipid Panel     Component Value Date/Time   CHOL 213 (H) 11/12/2019 1022   TRIG 142 11/12/2019 1022   HDL 44 (L) 11/12/2019 1022   LDLCALC 142 (H) 11/12/2019 1022    Hepatic Function Latest Ref Rng & Units 11/12/2019 07/31/2019 04/09/2019  Total Protein 6.1 - 8.1 g/dL 6.5 6.9 6.6  AST 10 - 35 U/L 11 13 11   ALT 6 - 29 U/L 7 10 12   Total Bilirubin 0.2 - 1.2 mg/dL 0.5 0.4 0.5     The 10-year ASCVD risk score Mikey Bussing DC Jr., et al., 2013) is: 13.7%   Values used to calculate the score:     Age: 31 years     Sex: Female     Is Non-Hispanic African American: No     Diabetic: No     Tobacco smoker: No     Systolic Blood Pressure: 086 mmHg     Is BP treated: Yes     HDL Cholesterol: 44 mg/dL     Total Cholesterol: 213 mg/dL   Patient has failed these meds in past: NA Patient is currently uncontrolled on the following medications:  . None  We discussed:  Statins  Doesn't want to start  Plan  Continue control with diet and exercise  OCD/ADHD   Patient has failed these meds in past: antipsychotics Patient is currently uncontrolled on the following medications:  . Effexor XR 75mg  daily . Klonopin 1mg   qhs  We discussed:   How did the visit with Dr. Kasandra Knudsen go? Well  Plan  Continue current medications  Medication Management   [facility] Uses pill box? Yes Pt endorses 100% compliance  We discussed:  All pills at lunch One Klonopin at bedtime On disability, pet sits  Has lots of Ziac Lots of Pepcid Venlafaxine 90 2 refills  Klonopin #30 remaining  Plan  Utilize [facility] for medication synchronization, packaging and delivery   Verbal consent obtained for [facility] enhanced pharmacy services (medication synchronization, adherence packaging, delivery coordination). A medication sync plan was created to allow patient to get all medications delivered once every 30 to 90 days per patient preference. Patient understands they have freedom to choose pharmacy and clinical pharmacist will  coordinate care between all prescribers and [facility].  Follow up: 3 month phone visit  Milus Height, PharmD, [place], [facility] [phone]

## 2019-12-26 ENCOUNTER — Telehealth: Payer: Self-pay

## 2019-12-26 NOTE — Progress Notes (Signed)
..   Reviewed chart for medication changes ahead of medication coordination call.  No OVs, Consults, or hospital visits since last care coordination call/Pharmacist visit.   BP Readings from Last 3 Encounters:  12/05/19 136/86  11/14/19 (!) 150/100  08/15/19 122/82    Lab Results  Component Value Date   HGBA1C 5.8 (H) 07/31/2019    Patient misunderstood the pharmacy portion. She thought that packaging would be done by her current pharmacy. She also had many questions about how this program worked and was paid for. Answered all questions regarding program to the best of my ability. Offered phone call from [facility] or [facility] billing for any lingering concerns. Patient declined at this time.

## 2019-12-27 NOTE — Patient Instructions (Addendum)
Visit Information  Goals Addressed            This Visit's Progress   . Chronic Care Management       CARE PLAN ENTRY (see longitudinal plan of care for additional care plan information)  Current Barriers:  . Chronic Disease Management support, education, and care coordination needs related to Hypertension, Hyperlipidemia, and Anxiety   Hypertension BP Readings from Last 3 Encounters:  12/05/19 136/86  11/14/19 (!) 150/100  08/15/19 122/82   . Pharmacist Clinical Goal(s): o Over the next 90 days, patient will work with PharmD and providers to maintain BP goal <140/90 . Current regimen:  o Bisoprolol-HCTZ 10-6.25 mg daily . Interventions: o None . Patient self care activities - Over the next 90 days, patient will: o Check BP weekly, document, and provide at future appointments o Ensure daily salt intake < 2300 mg/day  Hyperlipidemia Lab Results  Component Value Date/Time   LDLCALC 142 (H) 11/12/2019 10:22 AM   . Pharmacist Clinical Goal(s): o Over the next 90 days, patient will work with PharmD and providers to achieve LDL goal < 100 . Current regimen:  o None . Interventions: o None . Patient self care activities - Over the next 90 days, patient will: o Implement significant calorie and saturated fat reduction o Consider cholesterol medication as next step if LDL not below 100 in 3 months  Diabetes Lab Results  Component Value Date/Time   HGBA1C 5.8 (H) 07/31/2019 09:07 AM   HGBA1C 5.9 (H) 04/09/2019 12:00 AM   . Pharmacist Clinical Goal(s): o Over the next 90 days, patient will work with PharmD and providers to maintain A1c goal <7% . Current regimen:  o None . Interventions: o None . Patient self care activities - Over the next 90 days, patient will: o Check blood sugar once daily, document, and provide at future appointments o Contact provider with any episodes of hypoglycemia  Medication management . Pharmacist Clinical Goal(s): o Over the next 90  days, patient will work with PharmD and providers to achieve optimal medication adherence . Current [facility]: [facility] . Interventions o Comprehensive medication review performed. o Utilize [facility] for medication synchronization, packaging and delivery . Patient self care activities - Over the next 90 days, patient will: o Focus on medication adherence by working with new pharmacy to ensure timely and accurate medication delivery o Take medications as prescribed o Report any questions or concerns to PharmD and/or provider(s)  Initial goal documentation        Katherine Gould was given information about Chronic Care Management services today including:  1. CCM service includes personalized support from designated clinical staff supervised by her physician, including individualized plan of care and coordination with other care providers 2. 24/7 contact phone numbers for assistance for urgent and routine care needs. 3. Standard insurance, coinsurance, copays and deductibles apply for chronic care management only during months in which we provide at least 20 minutes of these services. Most insurances cover these services at 100%, however patients may be responsible for any copay, coinsurance and/or deductible if applicable. This service may help you avoid the need for more expensive face-to-face services. 4. Only one practitioner may furnish and bill the service in a calendar month. 5. The patient may stop CCM services at any time (effective at the end of the month) by phone call to the office staff.  Patient agreed to services and verbal consent obtained.   Print copy of patient instructions provided.  Telephone follow  up appointment with pharmacy team member scheduled for: 3 months  Milus Height, PharmD, [place], [facility] [phone]  Dyslipidemia Dyslipidemia is an imbalance of waxy, fat-like substances (lipids) in the blood. The body needs lipids  in small amounts. Dyslipidemia often involves a high level of cholesterol or triglycerides, which are types of lipids. Common forms of dyslipidemia include:  High levels of LDL cholesterol. LDL is the type of cholesterol that causes fatty deposits (plaques) to build up in the blood vessels that carry blood away from your heart (arteries).  Low levels of HDL cholesterol. HDL cholesterol is the type of cholesterol that protects against heart disease. High levels of HDL remove the LDL buildup from arteries.  High levels of triglycerides. Triglycerides are a fatty substance in the blood that is linked to a buildup of plaques in the arteries. What are the causes? Primary dyslipidemia is caused by changes (mutations) in genes that are passed down through families (inherited). These mutations cause several types of dyslipidemia. Secondary dyslipidemia is caused by lifestyle choices and diseases that lead to dyslipidemia, such as:  Eating a diet that is high in animal fat.  Not getting enough exercise.  Having diabetes, kidney disease, liver disease, or thyroid disease.  Drinking large amounts of alcohol.  Using certain medicines. What increases the risk? You are more likely to develop this condition if you are an older man or if you are a woman who has gone through menopause. Other risk factors include:  Having a family history of dyslipidemia.  Taking certain medicines, including birth control pills, steroids, some diuretics, and beta-blockers.  Smoking cigarettes.  Eating a high-fat diet.  Having certain medical conditions such as diabetes, polycystic ovary syndrome (PCOS), kidney disease, liver disease, or hypothyroidism.  Not exercising regularly.  Being overweight or obese with too much belly fat. What are the signs or symptoms? In most cases, dyslipidemia does not usually cause any symptoms. In severe cases, very high lipid levels can cause:  Fatty bumps under the skin  (xanthomas).  White or gray ring around the black center (pupil) of the eye. Very high triglyceride levels can cause inflammation of the pancreas (pancreatitis). How is this diagnosed? Your health care provider may diagnose dyslipidemia based on a routine blood test (fasting blood test). Because most people do not have symptoms of the condition, this blood testing (lipid profile) is done on adults age 28 and older and is repeated every 5 years. This test checks:  Total cholesterol. This measures the total amount of cholesterol in your blood, including LDL cholesterol, HDL cholesterol, and triglycerides. A healthy number is below 200.  LDL cholesterol. The target number for LDL cholesterol is different for each person, depending on individual risk factors. Ask your health care provider what your LDL cholesterol should be.  HDL cholesterol. An HDL level of 60 or higher is best because it helps to protect against heart disease. A number below 57 for men or below 66 for women increases the risk for heart disease.  Triglycerides. A healthy triglyceride number is below 150. If your lipid profile is abnormal, your health care provider may do other blood tests. How is this treated? Treatment depends on the type of dyslipidemia that you have and your other risk factors for heart disease and stroke. Your health care provider will have a target range for your lipid levels based on this information. For many people, this condition may be treated by lifestyle changes, such as diet and  exercise. Your health care provider may recommend that you:  Get regular exercise.  Make changes to your diet.  Quit smoking if you smoke. If diet changes and exercise do not help you reach your goals, your health care provider may also prescribe medicine to lower lipids. The most commonly prescribed type of medicine lowers your LDL cholesterol (statin drug). If you have a high triglyceride level, your provider may prescribe  another type of drug (fibrate) or an omega-3 fish oil supplement, or both. Follow these instructions at home:  Eating and drinking  Follow instructions from your health care provider or dietitian about eating or drinking restrictions.  Eat a healthy diet as told by your health care provider. This can help you reach and maintain a healthy weight, lower your LDL cholesterol, and raise your HDL cholesterol. This may include: ? Limiting your calories, if you are overweight. ? Eating more fruits, vegetables, whole grains, fish, and lean meats. ? Limiting saturated fat, trans fat, and cholesterol.  If you drink alcohol: ? Limit how much you use. ? Be aware of how much alcohol is in your drink. In the [place] one drink equals one 12 oz bottle of beer (355 mL), one 5 oz glass of wine (148 mL), or one 1 oz glass of hard liquor (44 mL).  Do not drink alcohol if: ? Your health care provider tells you not to drink. ? You are pregnant, may be pregnant, or are planning to become pregnant. Activity  Get regular exercise. Start an exercise and strength training program as told by your health care provider. Ask your health care provider what activities are safe for you. Your health care provider may recommend: ? 30 minutes of aerobic activity 4-6 days a week. Brisk walking is an example of aerobic activity. ? Strength training 2 days a week. General instructions  Do not use any products that contain nicotine or tobacco, such as cigarettes, e-cigarettes, and chewing tobacco. If you need help quitting, ask your health care provider.  Take over-the-counter and prescription medicines only as told by your health care provider. This includes supplements.  Keep all follow-up visits as told by your health care provider. Contact a health care provider if:  You are: ? Having trouble sticking to your exercise or diet plan. ? Struggling to quit smoking or control your use of alcohol. Summary  Dyslipidemia  often involves a high level of cholesterol or triglycerides, which are types of lipids.  Treatment depends on the type of dyslipidemia that you have and your other risk factors for heart disease and stroke.  For many people, treatment starts with lifestyle changes, such as diet and exercise.  Your health care provider may prescribe medicine to lower lipids. This information is not intended to replace advice given to you by your health care provider. Make sure you discuss any questions you have with your health care provider. Document Revised: 11/26/2017 Document Reviewed: 11/02/2017 Elsevier Patient Education  [place].

## 2019-12-29 DIAGNOSIS — M25552 Pain in left hip: Secondary | ICD-10-CM | POA: Diagnosis not present

## 2019-12-29 DIAGNOSIS — M5442 Lumbago with sciatica, left side: Secondary | ICD-10-CM | POA: Diagnosis not present

## 2019-12-29 DIAGNOSIS — M9903 Segmental and somatic dysfunction of lumbar region: Secondary | ICD-10-CM | POA: Diagnosis not present

## 2019-12-29 DIAGNOSIS — M25562 Pain in left knee: Secondary | ICD-10-CM | POA: Diagnosis not present

## 2020-01-13 DIAGNOSIS — R69 Illness, unspecified: Secondary | ICD-10-CM | POA: Diagnosis not present

## 2020-01-14 DIAGNOSIS — R69 Illness, unspecified: Secondary | ICD-10-CM | POA: Diagnosis not present

## 2020-01-15 DIAGNOSIS — R69 Illness, unspecified: Secondary | ICD-10-CM | POA: Diagnosis not present

## 2020-01-16 DIAGNOSIS — R69 Illness, unspecified: Secondary | ICD-10-CM | POA: Diagnosis not present

## 2020-01-19 DIAGNOSIS — R69 Illness, unspecified: Secondary | ICD-10-CM | POA: Diagnosis not present

## 2020-01-20 NOTE — Progress Notes (Deleted)
Name: Katherine Gould   MRN: [id number]    DOB: 05-07-1950   Date:01/20/2020       Progress Note  No chief complaint on file.    Subjective:   BREALYN BARIL is a 69 y.o. female, presents to clinic for   ***   Current Outpatient Medications:  .  bisoprolol-hydrochlorothiazide (ZIAC) 10-6.25 MG tablet, TAKE 1 TABLET BY MOUTH EVERY DAY, Disp: 90 tablet, Rfl: 3 .  Cholecalciferol (VITAMIN D3) 2000 UNITS capsule, Take 2,000 Units by mouth daily. Pt taking 4000 IU daily, Disp: , Rfl:  .  clonazePAM (KLONOPIN) 1 MG tablet, Take 1 mg by mouth 2 (two) times daily., Disp: , Rfl:  .  famotidine (PEPCID) 20 MG tablet, Take 1 tablet (20 mg total) by mouth 2 (two) times daily. (Patient taking differently: Take 20 mg by mouth daily. ), Disp: 180 tablet, Rfl: 1 .  Misc Natural Products (GLUCOSAMINE CHOND COMPLEX/MSM) TABS, Take 2 capsules by mouth daily., Disp: , Rfl:  .  Probiotic Product ([employer] PROBIOTIC MAXIMUM STRENGTH) CAPS, Take 1 capsule by mouth daily., Disp: , Rfl:  .  Turmeric 500 MG CAPS, Take 1,000 mg by mouth. , Disp: , Rfl:  .  venlafaxine XR (EFFEXOR-XR) 75 MG 24 hr capsule, Take 75 mg by mouth daily., Disp: , Rfl:  .  vitamin B-12 (CYANOCOBALAMIN) 1000 MCG tablet, Take 1,000 mcg by mouth daily., Disp: , Rfl:   Patient Active Problem List   Diagnosis Date Noted  . Severe episode of recurrent major depressive disorder, without psychotic features ([place]) 11/14/2019  . Habitual self-excoriation 11/14/2019  . GAD (generalized anxiety disorder) 11/14/2019  . Attention deficit hyperactivity disorder (ADHD), predominantly hyperactive type 11/14/2019  . Familial hyperlipidemia 08/21/2019  . Postmenopausal estrogen deficiency 04/08/2019  . GERD (gastroesophageal reflux disease) 12/10/2018  . OCD (obsessive compulsive disorder) 04/15/2018  . Morbid obesity ([place]) 04/15/2018  . Medication monitoring encounter 04/15/2018  . Vitamin B12 deficiency 04/15/2018  . Vitamin D deficiency 04/15/2018  .  Bilateral primary osteoarthritis of knee 04/15/2018  . Interstitial cystitis 04/15/2018  . OSA (obstructive sleep apnea) 07/14/2015  . Hypertension   . Hyperlipidemia 07/03/2012  . Prediabetes 07/03/2012    Past Surgical History:  Procedure Laterality Date  . LASER ABLATION CONDYLOMA CERVICAL / VULVAR      Family History  Problem Relation Age of Onset  . Heart failure Mother   . Aortic stenosis Mother   . Hypertension Mother   . Stroke Mother   . Glaucoma Mother   . Heart attack Father   . Hypertension Father   . Heart attack Paternal Grandfather   . Glaucoma Sister   . Hypertension Sister   . Atrial fibrillation Sister   . Heart failure Sister   . Anxiety disorder Sister     Social History   Tobacco Use  . Smoking status: Never Smoker  . Smokeless tobacco: Never Used  Vaping Use  . Vaping Use: Never used  Substance Use Topics  . Alcohol use: No  . Drug use: No     Allergies  Allergen Reactions  . Aripiprazole Other (See Comments)    Incubus Dream tremmors   . Quetiapine Other (See Comments)    Incubus dream    Health Maintenance  Topic Date Due  . DEXA SCAN  Never done  . COVID-19 Vaccine (1) Never done  . MAMMOGRAM  Never done  . INFLUENZA VACCINE  11/16/2019  . COLONOSCOPY  04/22/2025  . Hepatitis C Screening  Completed  . PNA vac Low Risk Adult  Completed  . TETANUS/TDAP  Discontinued    Chart Review Today: ***  Review of Systems   Objective:   There were no vitals filed for this visit.  There is no height or weight on file to calculate BMI.  Physical Exam      Assessment & Plan:   ***  No follow-ups on file.   Cathrine Muster, CMA 01/20/20 4:45 PM

## 2020-01-21 ENCOUNTER — Ambulatory Visit: Payer: Medicare HMO | Admitting: Family Medicine

## 2020-01-21 DIAGNOSIS — R69 Illness, unspecified: Secondary | ICD-10-CM | POA: Diagnosis not present

## 2020-01-21 NOTE — Progress Notes (Signed)
Name: Katherine Gould   MRN: [id number]    DOB: [birth year]   Date:01/22/2020       Progress Note  Chief Complaint  Patient presents with  . Follow-up     Subjective:   Katherine Gould is a 69 y.o. female, presents to clinic for routine f/up  Hypertension:  Currently managed on bisoprolol-HCTZ 10/6.25 mg qd Pt reports good med compliance and denies any SE.   Blood pressure today is well controlled. BP Readings from Last 3 Encounters:  01/22/20 124/78  12/05/19 136/86  11/14/19 (!) 150/100   Pt denies CP, SOB, exertional sx, LE edema, palpitation, Ha's, visual disturbances, lightheadedness, hypotension, syncope.   Hyperlipidemia:  Goal per cardiology LDL <130, pt repeatedly declines to take statin, has worked on diet/exercise with some improvement from April to July this year Last Lipids: Lab Results  Component Value Date   CHOL 213 (H) 11/12/2019   CHOL 257 (H) 07/31/2019   CHOL 220 (H) 04/09/2019   Lab Results  Component Value Date   HDL 44 (L) 11/12/2019   HDL 47 (L) 07/31/2019   HDL 53 04/09/2019   Lab Results  Component Value Date   LDLCALC 142 (H) 11/12/2019   LDLCALC 165 (H) 07/31/2019   LDLCALC 144 (H) 04/09/2019   Lab Results  Component Value Date   TRIG 142 11/12/2019   TRIG 274 (H) 07/31/2019   TRIG 112 04/09/2019   Lab Results  Component Value Date   CHOLHDL 4.8 11/12/2019   CHOLHDL 5.5 (H) 07/31/2019   CHOLHDL 4.2 04/09/2019   No results found for: LDLDIRECT  - Denies: Chest pain, shortness of breath, myalgias, claudication   OSA -  still having vivid dreams, snorts and wakes her up, poor sleep to begin  Managed by specialist - never able to do CPAP due to psych/severe claustrophobia  Reviewed prior cardiology NP 69 y.o. female with  ICD-10-CM  1. Essential hypertension-continue with bisoprolol hydrochlorothiazide and low-sodium diet and follow. I10  2. OSA (obstructive sleep apnea)-weight loss and CPAP use. G47.33  3. Stage 3a chronic  kidney disease (CMS-HCC) N18.31  4. Anxiety and depression F41.9  F32.9  5. Mixed hyperlipidemia-low-fat diet with an LDL goal of 130 or less. E78.2  6. Morbid obesity (CMS-HCC)-weight loss. E66.01  7. Chest pain syndrome-we will continue with current regimen proceed with a functional study to evaluate for ischemic etiology of her symptoms. Further recommendations after this is complete. R07.9 ECG 12-lead  NM myocardial perfusion SPECT multiple (stress and rest)  ECG stress test only   Patient continues to see psychiatric specialist and is doing different daily treatments, she would like to get specialist only in the area because of her daily treatments that she will be starting.  Anxiety and depression are still severe  She complains of left foot pain which is gradually worsening for months she states that it is inherited and that she has a "collapsed arch" she would like to see podiatrist at [facility] clinic -she heard about Dr. Luana Shu she is interested in a referral to see him    Current Outpatient Medications:  .  bisoprolol-hydrochlorothiazide (ZIAC) 10-6.25 MG tablet, TAKE 1 TABLET BY MOUTH EVERY DAY, Disp: 90 tablet, Rfl: 3 .  Cholecalciferol (VITAMIN D3) 2000 UNITS capsule, Take 2,000 Units by mouth daily. Pt taking 4000 IU daily, Disp: , Rfl:  .  clonazePAM (KLONOPIN) 1 MG tablet, Take 1 mg by mouth 2 (two) times daily., Disp: , Rfl:  .  famotidine (  PEPCID) 20 MG tablet, Take 1 tablet (20 mg total) by mouth 2 (two) times daily. (Patient taking differently: Take 20 mg by mouth daily. ), Disp: 180 tablet, Rfl: 1 .  Misc Natural Products (GLUCOSAMINE CHOND COMPLEX/MSM) TABS, Take 2 capsules by mouth daily., Disp: , Rfl:  .  Probiotic Product ([employer] PROBIOTIC MAXIMUM STRENGTH) CAPS, Take 1 capsule by mouth daily., Disp: , Rfl:  .  Turmeric 500 MG CAPS, Take 1,000 mg by mouth. , Disp: , Rfl:  .  venlafaxine XR (EFFEXOR-XR) 75 MG 24 hr capsule, Take 75 mg by mouth daily., Disp: , Rfl:  .   vitamin B-12 (CYANOCOBALAMIN) 1000 MCG tablet, Take 1,000 mcg by mouth daily., Disp: , Rfl:   Patient Active Problem List   Diagnosis Date Noted  . Severe episode of recurrent major depressive disorder, without psychotic features ([place]) 11/14/2019  . Habitual self-excoriation 11/14/2019  . GAD (generalized anxiety disorder) 11/14/2019  . Attention deficit hyperactivity disorder (ADHD), predominantly hyperactive type 11/14/2019  . Familial hyperlipidemia 08/21/2019  . Postmenopausal estrogen deficiency 04/08/2019  . GERD (gastroesophageal reflux disease) 12/10/2018  . OCD (obsessive compulsive disorder) 04/15/2018  . Morbid obesity ([place]) 04/15/2018  . Medication monitoring encounter 04/15/2018  . Vitamin B12 deficiency 04/15/2018  . Vitamin D deficiency 04/15/2018  . Bilateral primary osteoarthritis of knee 04/15/2018  . Interstitial cystitis 04/15/2018  . OSA (obstructive sleep apnea) 07/14/2015  . Hypertension   . Hyperlipidemia 07/03/2012  . Prediabetes 07/03/2012    Past Surgical History:  Procedure Laterality Date  . LASER ABLATION CONDYLOMA CERVICAL / VULVAR      Family History  Problem Relation Age of Onset  . Heart failure Mother   . Aortic stenosis Mother   . Hypertension Mother   . Stroke Mother   . Glaucoma Mother   . Heart attack Father   . Hypertension Father   . Heart attack Paternal Grandfather   . Glaucoma Sister   . Hypertension Sister   . Atrial fibrillation Sister   . Heart failure Sister   . Anxiety disorder Sister     Social History   Tobacco Use  . Smoking status: Never Smoker  . Smokeless tobacco: Never Used  Vaping Use  . Vaping Use: Never used  Substance Use Topics  . Alcohol use: No  . Drug use: No     Allergies  Allergen Reactions  . Aripiprazole Other (See Comments)    Incubus Dream tremmors   . Quetiapine Other (See Comments)    Incubus dream    Health Maintenance  Topic Date Due  . DEXA SCAN  Never done  . MAMMOGRAM   Never done  . COLONOSCOPY  04/22/2025  . INFLUENZA VACCINE  Completed  . COVID-19 Vaccine  Completed  . Hepatitis C Screening  Completed  . PNA vac Low Risk Adult  Completed  . TETANUS/TDAP  Discontinued    Chart Review Today: I personally reviewed active problem list, medication list, allergies, family history, social history, health maintenance, notes from last encounter, lab results, imaging with the patient/caregiver today.   Review of Systems  10 Systems reviewed and are negative for acute change except as noted in the HPI.  Objective:   Vitals:   01/22/20 1524  BP: 124/78  Pulse: 66  Resp: 16  Temp: 98.3 F (36.8 C)  TempSrc: Oral  SpO2: 93%  Weight: 212 lb 12.8 oz (96.5 kg)  Height: 5\' 5"  (1.651 m)    Body mass index is 35.41 kg/m.  Physical Exam Vitals and nursing note reviewed.  Constitutional:      General: She is not in acute distress.    Appearance: Normal appearance. She is well-developed. She is obese. She is not ill-appearing, toxic-appearing or diaphoretic.     Interventions: Face mask in place.  HENT:     Head: Normocephalic and atraumatic.     Right Ear: External ear normal.     Left Ear: External ear normal.  Eyes:     General: Lids are normal. No scleral icterus.       Right eye: No discharge.        Left eye: No discharge.     Conjunctiva/sclera: Conjunctivae normal.  Neck:     Trachea: Phonation normal. No tracheal deviation.  Cardiovascular:     Rate and Rhythm: Normal rate and regular rhythm.     Pulses: Normal pulses.     Heart sounds: Normal heart sounds. No murmur heard.  No friction rub. No gallop.   Pulmonary:     Effort: Pulmonary effort is normal. No respiratory distress.     Breath sounds: Normal breath sounds. No stridor. No wheezing, rhonchi or rales.  Chest:     Chest wall: No tenderness.  Abdominal:     General: Bowel sounds are normal. There is no distension.     Palpations: Abdomen is soft.  Musculoskeletal:      Left foot: Normal capillary refill. No swelling, bony tenderness or crepitus. Normal pulse.  Feet:     Left foot:     Skin integrity: Skin integrity normal. No ulcer, blister, skin breakdown, erythema or warmth.  Skin:    General: Skin is warm and dry.     Coloration: Skin is not jaundiced or pale.     Findings: No rash.  Neurological:     Mental Status: She is alert. Mental status is at baseline.     Motor: Tremor present. No abnormal muscle tone.     Gait: Gait abnormal.  Psychiatric:        Attention and Perception: Attention normal.        Mood and Affect: Mood is anxious and depressed. Affect is not tearful.        Behavior: Behavior is withdrawn.         Assessment & Plan:   1. Essential hypertension Last OV with Dr. Ancil Boozer BP was elevated Today it is well controlled and at goal. Pt compliant with meds, no changes needed at this time.   Reviewed recent labs  2. Mixed hyperlipidemia Reviewed last lipids, pt refuses statin, LDL did improve some earlier this year with strict diet efforts, but not yet at goal per cardiology <130 Continue efforts Pt may be open to trying zetia? No point to doing labs again with no med changes and no significant diet changes  3. Severe episode of recurrent major depressive disorder, without psychotic features ([place]) Chronic condition, uncontrolled, managed by psychiatry, pt compliant with med and f/up, starting new specialized daily tx  4. GAD (generalized anxiety disorder) Chronic condition, uncontrolled, managed by psychiatry, pt compliant with med and f/up, starting new specialized daily tx  For #3 and #4 PHQ and GAD 7 positive, scores consistently high, reviewed today - per psych, pt seeing daily (weekdays)  5. Morbid obesity ([place]) Pt has been limited on some of her efforts due to left foot pain  - see below Encouraged her to continue other diet/lifestyle efforts She notes she is an emotional eater  6. Need  for influenza  vaccination - Flu Vaccine QUAD High Dose(Fluad)  7. Left foot pain Left foot arch pain "collapsed arch" painful to walk - pt requests referral to [facility] podiatry - Dr. Luana Shu for eval - Ambulatory referral to Podiatry     Return in about 6 months (around 07/22/2020) for routine HTN, HLD, sugars.   Delsa Grana, PA-C 01/22/20 3:36 PM

## 2020-01-22 ENCOUNTER — Other Ambulatory Visit: Payer: Self-pay

## 2020-01-22 ENCOUNTER — Encounter: Payer: Self-pay | Admitting: Family Medicine

## 2020-01-22 ENCOUNTER — Ambulatory Visit (INDEPENDENT_AMBULATORY_CARE_PROVIDER_SITE_OTHER): Payer: Medicare HMO | Admitting: Family Medicine

## 2020-01-22 VITALS — BP 124/78 | HR 66 | Temp 98.3°F | Resp 16 | Ht 65.0 in | Wt 212.8 lb

## 2020-01-22 DIAGNOSIS — Z23 Encounter for immunization: Secondary | ICD-10-CM | POA: Diagnosis not present

## 2020-01-22 DIAGNOSIS — E782 Mixed hyperlipidemia: Secondary | ICD-10-CM

## 2020-01-22 DIAGNOSIS — F411 Generalized anxiety disorder: Secondary | ICD-10-CM | POA: Diagnosis not present

## 2020-01-22 DIAGNOSIS — F332 Major depressive disorder, recurrent severe without psychotic features: Secondary | ICD-10-CM | POA: Diagnosis not present

## 2020-01-22 DIAGNOSIS — I10 Essential (primary) hypertension: Secondary | ICD-10-CM | POA: Diagnosis not present

## 2020-01-22 DIAGNOSIS — R69 Illness, unspecified: Secondary | ICD-10-CM | POA: Diagnosis not present

## 2020-01-22 DIAGNOSIS — M79672 Pain in left foot: Secondary | ICD-10-CM

## 2020-01-22 NOTE — Patient Instructions (Addendum)
[facility] [place],    [address] [address] Directions Main: [phone]   Call and schedule your bone density and mammogram  Continue working on your diet and try to increase activity as tolerated (with foot pain etc)  You should hear from [facility] for the podiatry referral (they are slow - up to 6 weeks)  Let me know if you want the physical therapy referral.   DASH Eating Plan DASH stands for "Dietary Approaches to Stop Hypertension." The DASH eating plan is a healthy eating plan that has been shown to reduce high blood pressure (hypertension). It may also reduce your risk for type 2 diabetes, heart disease, and stroke. The DASH eating plan may also help with weight loss. What are tips for following this plan?  General guidelines  Avoid eating more than 2,300 mg (milligrams) of salt (sodium) a day. If you have hypertension, you may need to reduce your sodium intake to 1,500 mg a day.  Limit alcohol intake to no more than 1 drink a day for nonpregnant women and 2 drinks a day for men. One drink equals 12 oz of beer, 5 oz of wine, or 1 oz of hard liquor.  Work with your health care provider to maintain a healthy body weight or to lose weight. Ask what an ideal weight is for you.  Get at least 30 minutes of exercise that causes your heart to beat faster (aerobic exercise) most days of the week. Activities may include walking, swimming, or biking.  Work with your health care provider or diet and nutrition specialist (dietitian) to adjust your eating plan to your individual calorie needs. Reading food labels   Check food labels for the amount of sodium per serving. Choose foods with less than 5 percent of the Daily Value of sodium. Generally, foods with less than 300 mg of sodium per serving fit into this eating plan.  To find whole grains, look for the word "whole" as the first word in the ingredient  list. Shopping  Buy products labeled as "low-sodium" or "no salt added."  Buy fresh foods. Avoid canned foods and premade or frozen meals. Cooking  Avoid adding salt when cooking. Use salt-free seasonings or herbs instead of table salt or sea salt. Check with your health care provider or pharmacist before using salt substitutes.  Do not fry foods. Cook foods using healthy methods such as baking, boiling, grilling, and broiling instead.  Cook with heart-healthy oils, such as olive, canola, soybean, or sunflower oil. Meal planning  Eat a balanced diet that includes: ? 5 or more servings of fruits and vegetables each day. At each meal, try to fill half of your plate with fruits and vegetables. ? Up to 6-8 servings of whole grains each day. ? Less than 6 oz of lean meat, poultry, or fish each day. A 3-oz serving of meat is about the same size as a deck of cards. One egg equals 1 oz. ? 2 servings of low-fat dairy each day. ? A serving of nuts, seeds, or beans 5 times each week. ? Heart-healthy fats. Healthy fats called Omega-3 fatty acids are found in foods such as flaxseeds and coldwater fish, like sardines, salmon, and mackerel.  Limit how much you eat of the following: ? Canned or prepackaged foods. ? Food that is high in trans fat, such as fried foods. ? Food that is high in saturated fat, such as fatty meat. ? Sweets, desserts,  sugary drinks, and other foods with added sugar. ? Full-fat dairy products.  Do not salt foods before eating.  Try to eat at least 2 vegetarian meals each week.  Eat more home-cooked food and less restaurant, buffet, and fast food.  When eating at a restaurant, ask that your food be prepared with less salt or no salt, if possible. What foods are recommended? The items listed may not be a complete list. Talk with your dietitian about what dietary choices are best for you. Grains Whole-grain or whole-wheat bread. Whole-grain or whole-wheat pasta. Brown  rice. [place]. Bulgur. Whole-grain and low-sodium cereals. Pita bread. Low-fat, low-sodium crackers. Whole-wheat flour tortillas. Vegetables Fresh or frozen vegetables (raw, steamed, roasted, or grilled). Low-sodium or reduced-sodium tomato and vegetable juice. Low-sodium or reduced-sodium tomato sauce and tomato paste. Low-sodium or reduced-sodium canned vegetables. Fruits All fresh, dried, or frozen fruit. Canned fruit in natural juice (without added sugar). Meat and other protein foods Skinless chicken or [place]. Ground chicken or [place]. Pork with fat trimmed off. Fish and seafood. Egg whites. Dried beans, peas, or lentils. Unsalted nuts, nut butters, and seeds. Unsalted canned beans. Lean cuts of beef with fat trimmed off. Low-sodium, lean deli meat. Dairy Low-fat (1%) or fat-free (skim) milk. Fat-free, low-fat, or reduced-fat cheeses. Nonfat, low-sodium ricotta or cottage cheese. Low-fat or nonfat yogurt. Low-fat, low-sodium cheese. Fats and oils Soft margarine without trans fats. Vegetable oil. Low-fat, reduced-fat, or light mayonnaise and salad dressings (reduced-sodium). Canola, safflower, olive, soybean, and sunflower oils. Avocado. Seasoning and other foods Herbs. Spices. Seasoning mixes without salt. Unsalted popcorn and pretzels. Fat-free sweets. What foods are not recommended? The items listed may not be a complete list. Talk with your dietitian about what dietary choices are best for you. Grains Baked goods made with fat, such as croissants, muffins, or some breads. Dry pasta or rice meal packs. Vegetables Creamed or fried vegetables. Vegetables in a cheese sauce. Regular canned vegetables (not low-sodium or reduced-sodium). Regular canned tomato sauce and paste (not low-sodium or reduced-sodium). Regular tomato and vegetable juice (not low-sodium or reduced-sodium). Angie Fava. Olives. Fruits Canned fruit in a light or heavy syrup. Fried fruit. Fruit in cream or butter  sauce. Meat and other protein foods Fatty cuts of meat. Ribs. Fried meat. Berniece Salines. Sausage. Bologna and other processed lunch meats. Salami. Fatback. Hotdogs. Bratwurst. Salted nuts and seeds. Canned beans with added salt. Canned or smoked fish. Whole eggs or egg yolks. Chicken or [place] with skin. Dairy Whole or 2% milk, cream, and half-and-half. Whole or full-fat cream cheese. Whole-fat or sweetened yogurt. Full-fat cheese. Nondairy creamers. Whipped toppings. Processed cheese and cheese spreads. Fats and oils Butter. Stick margarine. Lard. Shortening. Ghee. Bacon fat. Tropical oils, such as coconut, palm kernel, or palm oil. Seasoning and other foods Salted popcorn and pretzels. Onion salt, garlic salt, seasoned salt, table salt, and sea salt. Worcestershire sauce. Tartar sauce. Barbecue sauce. Teriyaki sauce. Soy sauce, including reduced-sodium. Steak sauce. Canned and packaged gravies. Fish sauce. Oyster sauce. Cocktail sauce. Horseradish that you find on the shelf. Ketchup. Mustard. Meat flavorings and tenderizers. Bouillon cubes. Hot sauce and Tabasco sauce. Premade or packaged marinades. Premade or packaged taco seasonings. Relishes. Regular salad dressings. Where to find more information:  National Heart, Lung, and [place]: https://wilson-eaton.com/  American Heart Association: www.heart.org Summary  The DASH eating plan is a healthy eating plan that has been shown to reduce high blood pressure (hypertension). It may also reduce your risk for type 2 diabetes, heart disease,  and stroke.  With the DASH eating plan, you should limit salt (sodium) intake to 2,300 mg a day. If you have hypertension, you may need to reduce your sodium intake to 1,500 mg a day.  When on the DASH eating plan, aim to eat more fresh fruits and vegetables, whole grains, lean proteins, low-fat dairy, and heart-healthy fats.  Work with your health care provider or diet and nutrition specialist (dietitian) to adjust  your eating plan to your individual calorie needs. This information is not intended to replace advice given to you by your health care provider. Make sure you discuss any questions you have with your health care provider. Document Revised: 03/16/2017 Document Reviewed: 03/27/2016 Elsevier Patient Education  2020 [employer].

## 2020-01-23 DIAGNOSIS — R69 Illness, unspecified: Secondary | ICD-10-CM | POA: Diagnosis not present

## 2020-01-27 DIAGNOSIS — R69 Illness, unspecified: Secondary | ICD-10-CM | POA: Diagnosis not present

## 2020-01-28 ENCOUNTER — Encounter: Payer: Self-pay | Admitting: Family Medicine

## 2020-01-28 DIAGNOSIS — R69 Illness, unspecified: Secondary | ICD-10-CM | POA: Diagnosis not present

## 2020-01-30 DIAGNOSIS — M79672 Pain in left foot: Secondary | ICD-10-CM | POA: Diagnosis not present

## 2020-01-30 DIAGNOSIS — M2142 Flat foot [pes planus] (acquired), left foot: Secondary | ICD-10-CM | POA: Diagnosis not present

## 2020-01-30 DIAGNOSIS — Z6837 Body mass index (BMI) 37.0-37.9, adult: Secondary | ICD-10-CM | POA: Diagnosis not present

## 2020-01-30 DIAGNOSIS — R69 Illness, unspecified: Secondary | ICD-10-CM | POA: Diagnosis not present

## 2020-01-30 DIAGNOSIS — M19072 Primary osteoarthritis, left ankle and foot: Secondary | ICD-10-CM | POA: Diagnosis not present

## 2020-01-30 DIAGNOSIS — M76822 Posterior tibial tendinitis, left leg: Secondary | ICD-10-CM | POA: Diagnosis not present

## 2020-01-30 DIAGNOSIS — E6609 Other obesity due to excess calories: Secondary | ICD-10-CM | POA: Diagnosis not present

## 2020-01-30 DIAGNOSIS — M216X1 Other acquired deformities of right foot: Secondary | ICD-10-CM | POA: Diagnosis not present

## 2020-01-30 DIAGNOSIS — M2141 Flat foot [pes planus] (acquired), right foot: Secondary | ICD-10-CM | POA: Diagnosis not present

## 2020-01-30 DIAGNOSIS — M7732 Calcaneal spur, left foot: Secondary | ICD-10-CM | POA: Diagnosis not present

## 2020-01-30 DIAGNOSIS — M216X2 Other acquired deformities of left foot: Secondary | ICD-10-CM | POA: Diagnosis not present

## 2020-02-02 DIAGNOSIS — R69 Illness, unspecified: Secondary | ICD-10-CM | POA: Diagnosis not present

## 2020-02-03 DIAGNOSIS — R69 Illness, unspecified: Secondary | ICD-10-CM | POA: Diagnosis not present

## 2020-02-04 DIAGNOSIS — R69 Illness, unspecified: Secondary | ICD-10-CM | POA: Diagnosis not present

## 2020-02-05 DIAGNOSIS — R69 Illness, unspecified: Secondary | ICD-10-CM | POA: Diagnosis not present

## 2020-02-06 DIAGNOSIS — R69 Illness, unspecified: Secondary | ICD-10-CM | POA: Diagnosis not present

## 2020-02-09 DIAGNOSIS — R69 Illness, unspecified: Secondary | ICD-10-CM | POA: Diagnosis not present

## 2020-02-10 DIAGNOSIS — R69 Illness, unspecified: Secondary | ICD-10-CM | POA: Diagnosis not present

## 2020-02-11 DIAGNOSIS — G4700 Insomnia, unspecified: Secondary | ICD-10-CM | POA: Diagnosis not present

## 2020-02-11 DIAGNOSIS — F411 Generalized anxiety disorder: Secondary | ICD-10-CM | POA: Diagnosis not present

## 2020-02-11 DIAGNOSIS — R69 Illness, unspecified: Secondary | ICD-10-CM | POA: Diagnosis not present

## 2020-02-12 DIAGNOSIS — R69 Illness, unspecified: Secondary | ICD-10-CM | POA: Diagnosis not present

## 2020-02-13 DIAGNOSIS — R69 Illness, unspecified: Secondary | ICD-10-CM | POA: Diagnosis not present

## 2020-02-16 DIAGNOSIS — R69 Illness, unspecified: Secondary | ICD-10-CM | POA: Diagnosis not present

## 2020-02-17 ENCOUNTER — Telehealth: Payer: Self-pay

## 2020-02-17 NOTE — Telephone Encounter (Signed)
Copied from [place] [phone]. Topic: General - Other >> Feb 17, 2020  2:34 PM Keene Breath wrote: Reason for CRM: Patient called to inform the doctor that her psychiatrist was not able to get her samples of the insomnia medication that she spoke about with the doctor, Dayvigo.  Patient would like to know if the doctor could prescribe the medication for the patient.  Please advise and let patient know at [phone]

## 2020-02-18 DIAGNOSIS — R69 Illness, unspecified: Secondary | ICD-10-CM | POA: Diagnosis not present

## 2020-02-18 NOTE — Telephone Encounter (Signed)
Pt is calling checking on insomnia medication

## 2020-02-19 DIAGNOSIS — R69 Illness, unspecified: Secondary | ICD-10-CM | POA: Diagnosis not present

## 2020-02-24 DIAGNOSIS — R69 Illness, unspecified: Secondary | ICD-10-CM | POA: Diagnosis not present

## 2020-02-24 NOTE — Telephone Encounter (Signed)
Patient checking on the status of message below, please advise and leave a detail message

## 2020-02-24 NOTE — Telephone Encounter (Signed)
Please check message and advise

## 2020-02-25 DIAGNOSIS — R69 Illness, unspecified: Secondary | ICD-10-CM | POA: Diagnosis not present

## 2020-02-25 MED ORDER — DAYVIGO 10 MG PO TABS: 5.0000 mg | ORAL_TABLET | Freq: Every evening | ORAL | 1 refills | Status: DC | PRN

## 2020-02-25 NOTE — Addendum Note (Signed)
Addended by: Delsa Grana on: 02/25/2020 06:17 PM   Modules accepted: Orders

## 2020-02-25 NOTE — Telephone Encounter (Signed)
I put the dayvigo info on your desk yesterday - Marnette Burgess had the rep contact info somewhere in the calendar - Please call the rep and ask her how we can get the 10 d trial for pt ordered for them.  Lmk what she says, if we can't get the sample then I can put in the Rx for the pt and she can see what the cost or coverage is at her pharmacy.  Delsa Grana, PA-C

## 2020-02-26 DIAGNOSIS — R69 Illness, unspecified: Secondary | ICD-10-CM | POA: Diagnosis not present

## 2020-02-26 NOTE — Telephone Encounter (Signed)
Called rep for samples

## 2020-02-27 DIAGNOSIS — M25552 Pain in left hip: Secondary | ICD-10-CM | POA: Diagnosis not present

## 2020-02-27 DIAGNOSIS — M9903 Segmental and somatic dysfunction of lumbar region: Secondary | ICD-10-CM | POA: Diagnosis not present

## 2020-02-27 DIAGNOSIS — M25562 Pain in left knee: Secondary | ICD-10-CM | POA: Diagnosis not present

## 2020-02-27 DIAGNOSIS — R69 Illness, unspecified: Secondary | ICD-10-CM | POA: Diagnosis not present

## 2020-02-27 DIAGNOSIS — M5442 Lumbago with sciatica, left side: Secondary | ICD-10-CM | POA: Diagnosis not present

## 2020-03-01 DIAGNOSIS — M79672 Pain in left foot: Secondary | ICD-10-CM | POA: Diagnosis not present

## 2020-03-01 DIAGNOSIS — M216X2 Other acquired deformities of left foot: Secondary | ICD-10-CM | POA: Diagnosis not present

## 2020-03-01 DIAGNOSIS — M2142 Flat foot [pes planus] (acquired), left foot: Secondary | ICD-10-CM | POA: Diagnosis not present

## 2020-03-01 DIAGNOSIS — M19072 Primary osteoarthritis, left ankle and foot: Secondary | ICD-10-CM | POA: Diagnosis not present

## 2020-03-01 DIAGNOSIS — E6609 Other obesity due to excess calories: Secondary | ICD-10-CM | POA: Diagnosis not present

## 2020-03-01 DIAGNOSIS — R69 Illness, unspecified: Secondary | ICD-10-CM | POA: Diagnosis not present

## 2020-03-01 DIAGNOSIS — M7732 Calcaneal spur, left foot: Secondary | ICD-10-CM | POA: Diagnosis not present

## 2020-03-01 DIAGNOSIS — M216X1 Other acquired deformities of right foot: Secondary | ICD-10-CM | POA: Diagnosis not present

## 2020-03-01 DIAGNOSIS — M76822 Posterior tibial tendinitis, left leg: Secondary | ICD-10-CM | POA: Diagnosis not present

## 2020-03-01 DIAGNOSIS — M792 Neuralgia and neuritis, unspecified: Secondary | ICD-10-CM | POA: Diagnosis not present

## 2020-03-01 DIAGNOSIS — M2141 Flat foot [pes planus] (acquired), right foot: Secondary | ICD-10-CM | POA: Diagnosis not present

## 2020-03-02 DIAGNOSIS — R69 Illness, unspecified: Secondary | ICD-10-CM | POA: Diagnosis not present

## 2020-03-04 DIAGNOSIS — R69 Illness, unspecified: Secondary | ICD-10-CM | POA: Diagnosis not present

## 2020-03-08 DIAGNOSIS — R69 Illness, unspecified: Secondary | ICD-10-CM | POA: Diagnosis not present

## 2020-03-09 ENCOUNTER — Telehealth: Payer: Self-pay | Admitting: Family Medicine

## 2020-03-09 DIAGNOSIS — R69 Illness, unspecified: Secondary | ICD-10-CM | POA: Diagnosis not present

## 2020-03-09 NOTE — Telephone Encounter (Signed)
Patient is calling to see if the referral dx could be adjusted. Patient was to be seen for OA knee ( left worse than right ) impingement syndrome left shoulder.  Now her right shoulder and left hip is bothering her. It is hard to drive with this pain.  Please advise CB- [phone]

## 2020-03-09 NOTE — Telephone Encounter (Signed)
Patient is requesting referral PT to [facility] clinic in [place] or in [place]. Please advise

## 2020-03-16 NOTE — Telephone Encounter (Signed)
Called NA 

## 2020-03-19 DIAGNOSIS — R69 Illness, unspecified: Secondary | ICD-10-CM | POA: Diagnosis not present

## 2020-03-24 DIAGNOSIS — R69 Illness, unspecified: Secondary | ICD-10-CM | POA: Diagnosis not present

## 2020-03-25 DIAGNOSIS — R69 Illness, unspecified: Secondary | ICD-10-CM | POA: Diagnosis not present

## 2020-03-29 NOTE — Telephone Encounter (Signed)
Patient is calling back. Patient does not feel it is necessary to come back in for an OV. Patient feels like a Katherine Gould Could not do anything . Because an x-ray would be necessary. Please advise Cb- [phone]

## 2020-04-01 ENCOUNTER — Telehealth: Payer: Self-pay

## 2020-04-05 NOTE — Telephone Encounter (Signed)
Patient was notified we need proper documentation to order test

## 2020-04-15 ENCOUNTER — Telehealth: Payer: Self-pay

## 2020-04-27 ENCOUNTER — Ambulatory Visit: Payer: Medicare HMO

## 2020-05-10 ENCOUNTER — Telehealth: Payer: Self-pay | Admitting: Family Medicine

## 2020-05-10 NOTE — Telephone Encounter (Signed)
Copied from [place] [phone]. Topic: Medicare AWV >> May 10, 2020  1:50 PM Cher Nakai R wrote: Reason for CRM:   Left message for patient to call back and schedule Medicare Annual Wellness Visit (AWV) in office.   If unable to come into the office for AWV,  please offer to do virtually or by telephone.  Last AWV  04/24/2019  Please schedule at anytime with [place].  40 minute appointment  Any questions, please contact me at [phone]

## 2020-05-12 DIAGNOSIS — R69 Illness, unspecified: Secondary | ICD-10-CM | POA: Diagnosis not present

## 2020-05-12 DIAGNOSIS — F332 Major depressive disorder, recurrent severe without psychotic features: Secondary | ICD-10-CM | POA: Diagnosis not present

## 2020-05-18 DIAGNOSIS — M25562 Pain in left knee: Secondary | ICD-10-CM | POA: Diagnosis not present

## 2020-05-18 DIAGNOSIS — G8929 Other chronic pain: Secondary | ICD-10-CM | POA: Diagnosis not present

## 2020-05-18 DIAGNOSIS — M25561 Pain in right knee: Secondary | ICD-10-CM | POA: Diagnosis not present

## 2020-05-18 DIAGNOSIS — M170 Bilateral primary osteoarthritis of knee: Secondary | ICD-10-CM | POA: Diagnosis not present

## 2020-05-25 ENCOUNTER — Telehealth: Payer: Self-pay | Admitting: Family Medicine

## 2020-05-25 NOTE — Telephone Encounter (Signed)
Copied from [place] [phone]. Topic: Medicare AWV >> May 25, 2020  1:15 PM Cher Nakai R wrote: Reason for CRM:   Left message for patient to call back and schedule Medicare Annual Wellness Visit (AWV) in office.   If unable to come into the office for AWV,  please offer to do virtually or by telephone.  Last AW 04/24/2019  Please schedule at anytime with [place].  40 minute appointment  Any questions, please contact me at [phone]

## 2020-06-01 ENCOUNTER — Ambulatory Visit: Payer: Medicare HMO | Admitting: Family Medicine

## 2020-06-16 ENCOUNTER — Telehealth: Payer: Self-pay | Admitting: Family Medicine

## 2020-06-16 ENCOUNTER — Telehealth: Payer: Self-pay

## 2020-06-16 NOTE — Telephone Encounter (Signed)
Left vm, she would need appt.  The providers do not give rx for antibiotics without appt even if they drop off urine.  I left vm we had 1 opening left for Friday with [place] at 8, if taken when she calls back she will need to go to walk-in clinic or urgent care.

## 2020-06-16 NOTE — Telephone Encounter (Signed)
Copied from [place] [phone]. Topic: General - Inquiry >> Jun 16, 2020 12:53 PM Scherrie Gerlach wrote: Reason for CRM: pt states she has interstitial cystitis and she thinks she has a UTI, or one coming on. No appts available.  Pt wants to know if OK to drop off sample? But she is aware she probably must come in. If there are any cancels, she would like to be called. >> Jun 16, 2020  2:15 PM Karene Fry wrote: lvm for pt to schedule appt

## 2020-06-16 NOTE — Telephone Encounter (Signed)
Copied from [place] [phone]. Topic: General - Inquiry >> Jun 16, 2020 12:53 PM Scherrie Gerlach wrote: Reason for CRM: pt states she has interstitial cystitis and she thinks she has a UTI, or one coming on. No appts available.  Pt wants to know if OK to drop off sample? But she is aware she probably must come in. If there are any cancels, she would like to be called.

## 2020-06-16 NOTE — Telephone Encounter (Signed)
Copied from [place] [phone]. Topic: Medicare AWV >> Jun 16, 2020  1:31 PM Cher Nakai R wrote: Reason for CRM:  Left message for patient to call back and schedule Medicare Annual Wellness Visit (AWV) in office.   If unable to come into the office for AWV,  please offer to do virtually or by telephone.  Last AWV: 04/24/2019  Please schedule at anytime with [place].  40 minute appointment  Any questions, please contact me at [phone]

## 2020-06-21 ENCOUNTER — Ambulatory Visit: Payer: Self-pay

## 2020-06-21 ENCOUNTER — Other Ambulatory Visit: Payer: Self-pay

## 2020-06-21 ENCOUNTER — Ambulatory Visit
Admission: EM | Admit: 2020-06-21 | Discharge: 2020-06-21 | Disposition: A | Discharge status: Home / Self Care | Payer: Medicare HMO | Attending: Internal Medicine | Admitting: Internal Medicine

## 2020-06-21 DIAGNOSIS — N390 Urinary tract infection, site not specified: Secondary | ICD-10-CM | POA: Diagnosis not present

## 2020-06-21 HISTORY — DX: Chronic kidney disease, unspecified: N18.9

## 2020-06-21 HISTORY — DX: Interstitial cystitis (chronic) without hematuria: N30.10

## 2020-06-21 LAB — URINALYSIS, COMPLETE (UACMP) WITH MICROSCOPIC
Bilirubin Urine: NEGATIVE
Glucose, UA: NEGATIVE mg/dL
Hgb urine dipstick: NEGATIVE
Ketones, ur: NEGATIVE mg/dL
Nitrite: NEGATIVE
Protein, ur: NEGATIVE mg/dL
Specific Gravity, Urine: 1.02 (ref 1.005–1.030)
pH: 5.5 (ref 5.0–8.0)

## 2020-06-21 MED ORDER — NITROFURANTOIN MONOHYD MACRO 100 MG PO CAPS: 100.0000 mg | ORAL_CAPSULE | Freq: Two times a day (BID) | ORAL | 0 refills | Status: DC

## 2020-06-21 NOTE — ED Provider Notes (Signed)
[facility]    CSN: [id number] Arrival date & time: 06/21/20  1252      History   Chief Complaint Chief Complaint  Patient presents with  . Dysuria    HPI FEDERICA ALLPORT is a 70 y.o. female.   HPI   70 year old female here for evaluation of urinary complaints.  Patient reports that she has had a stinging at the end of urination along with increased urinary urgency and frequency for the past several weeks.  Patient also reports that she developed some suprapubic pressure and low back pain yesterday.  Patient has a history of interstitial cystitis and has been delaying seeking care because she thought it was an IC flare.  Patient denies fever, blood in her urine, or cloudiness to her urine.  Past Medical History:  Diagnosis Date  . Cervical polyp   . Chronic kidney disease   . CKD (chronic kidney disease)   . Dysthymic disorder   . Esophageal reflux   . Hyperlipidemia   . Hypertension   . Interstitial cystitis   . Obsessive-compulsive disorders   . Other abnormal glucose   . Other abnormal glucose   . Other B-complex deficiencies     Patient Active Problem List   Diagnosis Date Noted  . Severe episode of recurrent major depressive disorder, without psychotic features ([place]) 11/14/2019  . Habitual self-excoriation 11/14/2019  . GAD (generalized anxiety disorder) 11/14/2019  . Attention deficit hyperactivity disorder (ADHD), predominantly hyperactive type 11/14/2019  . Familial hyperlipidemia 08/21/2019  . Postmenopausal estrogen deficiency 04/08/2019  . GERD (gastroesophageal reflux disease) 12/10/2018  . OCD (obsessive compulsive disorder) 04/15/2018  . Morbid obesity ([place]) 04/15/2018  . Medication monitoring encounter 04/15/2018  . Vitamin B12 deficiency 04/15/2018  . Vitamin D deficiency 04/15/2018  . Bilateral primary osteoarthritis of knee 04/15/2018  . Interstitial cystitis 04/15/2018  . OSA (obstructive sleep apnea) 07/14/2015  . Hypertension   .  Hyperlipidemia 07/03/2012  . Prediabetes 07/03/2012    Past Surgical History:  Procedure Laterality Date  . LASER ABLATION CONDYLOMA CERVICAL / VULVAR      OB History   No obstetric history on file.      Home Medications    Prior to Admission medications   Medication Sig Start Date End Date Taking? Authorizing Provider  bisoprolol-hydrochlorothiazide [facility]) 10-6.25 MG tablet TAKE 1 TABLET BY MOUTH EVERY DAY 11/17/19  Yes Delsa Grana, PA-C  Cholecalciferol (VITAMIN D3) 2000 UNITS capsule Take 2,000 Units by mouth daily. Pt taking 4000 IU daily   Yes [provider]  clonazePAM (KLONOPIN) 1 MG tablet Take 1 mg by mouth 2 (two) times daily.   Yes Myer Haff, MD  Misc Natural Products (GLUCOSAMINE CHOND COMPLEX/MSM) TABS Take 2 capsules by mouth daily.   Yes [provider]  nitrofurantoin, macrocrystal-monohydrate, (MACROBID) 100 MG capsule Take 1 capsule (100 mg total) by mouth 2 (two) times daily. 06/21/20  Yes Margarette Canada, NP  Probiotic Product ([employer] PROBIOTIC MAXIMUM STRENGTH) CAPS Take 1 capsule by mouth daily.   Yes [provider]  Turmeric 500 MG CAPS Take 1,000 mg by mouth.    Yes [provider]  venlafaxine XR (EFFEXOR-XR) 75 MG 24 hr capsule Take 75 mg by mouth daily. 10/28/19  Yes Myer Haff, MD  vitamin B-12 (CYANOCOBALAMIN) 1000 MCG tablet Take 1,000 mcg by mouth daily.   Yes [provider]  famotidine (PEPCID) 20 MG tablet Take 1 tablet (20 mg total) by mouth 2 (two) times daily. Patient taking  differently: Take 20 mg by mouth daily.  06/03/19   Delsa Grana, PA-C  Lemborexant (DAYVIGO) 10 MG TABS Take 5 mg by mouth at bedtime as needed (take 30-60 min before bedtime, must have more than 7 hours to sleep). 02/25/20   Delsa Grana, PA-C    Family History Family History  Problem Relation Age of Onset  . Heart failure Mother   . Aortic stenosis Mother   . Hypertension Mother   . Stroke Mother   . Glaucoma Mother   . Heart  attack Father   . Hypertension Father   . Heart attack Paternal Grandfather   . Glaucoma Sister   . Hypertension Sister   . Atrial fibrillation Sister   . Heart failure Sister   . Anxiety disorder Sister     Social History Social History   Tobacco Use  . Smoking status: Never Smoker  . Smokeless tobacco: Never Used  Vaping Use  . Vaping Use: Never used  Substance Use Topics  . Alcohol use: No  . Drug use: No     Allergies   Aripiprazole and Quetiapine   Review of Systems Review of Systems  Constitutional: Negative for activity change, appetite change and fever.  Gastrointestinal: Negative for diarrhea, nausea and vomiting.  Genitourinary: Positive for dysuria, frequency and urgency. Negative for hematuria.  Musculoskeletal: Positive for back pain.  Skin: Negative for rash.  Hematological: Negative.   Psychiatric/Behavioral: Negative.      Physical Exam Triage Vital Signs ED Triage Vitals  Enc Vitals Group     BP 06/21/20 1311 (!) 157/87     Pulse Rate 06/21/20 1311 (!) 58     Resp 06/21/20 1311 18     Temp 06/21/20 1311 98.1 F (36.7 C)     Temp Source 06/21/20 1311 Oral     SpO2 06/21/20 1311 95 %     Weight 06/21/20 1306 218 lb (98.9 kg)     Height 06/21/20 1306 5\' 5"  (1.651 m)     Head Circumference --      Peak Flow --      Pain Score 06/21/20 1306 0     Pain Loc --      Pain Edu? --      Excl. in [place]? --    No data found.  Updated Vital Signs BP (!) 157/87 (BP Location: Left Arm)   Pulse (!) 58   Temp 98.1 F (36.7 C) (Oral)   Resp 18   Ht 5\' 5"  (1.651 m)   Wt 218 lb (98.9 kg)   SpO2 95%   BMI 36.28 kg/m   Visual Acuity Right Eye Distance:   Left Eye Distance:   Bilateral Distance:    Right Eye Near:   Left Eye Near:    Bilateral Near:     Physical Exam Vitals and nursing note reviewed.  Constitutional:      General: She is not in acute distress.    Appearance: Normal appearance. She is not ill-appearing.  HENT:     Head:  Normocephalic and atraumatic.  Cardiovascular:     Rate and Rhythm: Normal rate and regular rhythm.     Pulses: Normal pulses.     Heart sounds: Normal heart sounds. No murmur heard. No gallop.   Pulmonary:     Effort: Pulmonary effort is normal.     Breath sounds: Normal breath sounds. No wheezing, rhonchi or rales.  Abdominal:     Tenderness: There is no right CVA tenderness or  left CVA tenderness.  Skin:    General: Skin is warm and dry.     Capillary Refill: Capillary refill takes less than 2 seconds.     Findings: No erythema or rash.  Neurological:     General: No focal deficit present.     Mental Status: She is alert and oriented to person, place, and time.  Psychiatric:        Mood and Affect: Mood normal.        Thought Content: Thought content normal.      UC Treatments / Results  Labs (all labs ordered are listed, but only abnormal results are displayed) Labs Reviewed  URINALYSIS, COMPLETE (UACMP) WITH MICROSCOPIC - Abnormal; Notable for the following components:      Result Value   APPearance HAZY (*)    Leukocytes,Ua MODERATE (*)    Bacteria, UA FEW (*)    All other components within normal limits  URINE CULTURE    EKG   Radiology No results found.  Procedures Procedures (including critical care time)  Medications Ordered in UC Medications - No data to display  Initial Impression / Assessment and Plan / UC Course  I have reviewed the triage vital signs and the nursing notes.  Pertinent labs & imaging results that were available during my care of the patient were reviewed by me and considered in my medical decision making (see chart for details).   Patient is a very pleasant, nontoxic-appearing 70 year old female here for evaluation of urinary complaints that started a week or more ago.  Patient has a history of IC and had delayed seeking care because she thought it was an IC flare.  She does not have any fever, hematuria, or cloudiness to her urine.   Patient reports that she decided come in today because she developed low back pain and suprapubic pressure yesterday.  Urine sample collected at triage.  UA shows a hazy appearance with moderate leukocytes and few bacteria.  There are white blood cells in clumps present.  Urine is nitrite negative.  WBCs are 6-10.  Will send urine for culture.   Final Clinical Impressions(s) / UC Diagnoses   Final diagnoses:  Lower urinary tract infectious disease     Discharge Instructions     Take the Macrobid twice daily for 5 days with food for treatment of urinary tract infection.  Increase your oral fluid intake so that you increase your urine production and or flushing your urinary system.  Take an over-the-counter probiotic, such as Culturelle-Align-Activia, 1 hour after each dose of antibiotic to prevent diarrhea or yeast infections from forming.  We will culture urine and change the antibiotics if necessary.  Return for reevaluation, or see your primary care provider, for any new or worsening symptoms.     ED Prescriptions    Medication Sig Dispense Auth. Provider   nitrofurantoin, macrocrystal-monohydrate, (MACROBID) 100 MG capsule Take 1 capsule (100 mg total) by mouth 2 (two) times daily. 10 capsule Margarette Canada, NP     PDMP not reviewed this encounter.   Margarette Canada, NP 06/21/20 1352

## 2020-06-21 NOTE — Discharge Instructions (Addendum)
Take the Macrobid twice daily for 5 days with food for treatment of urinary tract infection.  Increase your oral fluid intake so that you increase your urine production and or flushing your urinary system.  Take an over-the-counter probiotic, such as Culturelle-Align-Activia, 1 hour after each dose of antibiotic to prevent diarrhea or yeast infections from forming.  We will culture urine and change the antibiotics if necessary.  Return for reevaluation, or see your primary care provider, for any new or worsening symptoms.

## 2020-06-21 NOTE — ED Triage Notes (Signed)
Pt c/o burning with urination, increasing over the past few weeks. Pt does have hx of interstitial cystitis and CKD. Pt also has urinary frequency/urgency. Pt denies f/n/v/d or other symptoms.

## 2020-06-23 LAB — URINE CULTURE

## 2020-07-08 ENCOUNTER — Telehealth: Payer: Self-pay

## 2020-07-08 NOTE — Progress Notes (Signed)
    Chronic Care Management Pharmacy Assistant   Name: Katherine Gould  MRN: [id number] DOB: [birth year]  Reason for Encounter: Medication Review/General adherence call.   Recent office visits:  01/22/2020 PCP Delsa Grana   Recent consult visits:  12/29/2019 Chiropractic Medicine Grant Fontana 01/30/2020 Podiatry Caroline More 02/11/2020 Behavioral Health 02/27/2020  Chiropractic Medicine Grant Fontana 03/01/2020 Podiatry Caroline More started Topical compounding cream for neuropathic pain    Hospital visits:  Medication Reconciliation was completed by comparing discharge summary, patient's EMR and Pharmacy list, and upon discussion with patient.  Urgent care visit on 06/21/2020 due to Dysuria.   New?Medications Started at [facility] Discharge:?? -started Macrobid 100 mg  twice daily for 5 days with food for treatment of urinary tract infection.  Started Culturelle-Align-Activia (OTC), 1 hour after each dose of antibiotic to prevent diarrhea or yeast infections from forming.  Medication Changes at Hospital Discharge: -None   Medications Discontinued at Hospital Discharge: -None   Medications that remain the same after Hospital Discharge:??  -All other medications will remain the same.    Medications: Outpatient Encounter Medications as of 07/08/2020  Medication Sig  . bisoprolol-hydrochlorothiazide (ZIAC) 10-6.25 MG tablet TAKE 1 TABLET BY MOUTH EVERY DAY  . Cholecalciferol (VITAMIN D3) 2000 UNITS capsule Take 2,000 Units by mouth daily. Pt taking 4000 IU daily  . clonazePAM (KLONOPIN) 1 MG tablet Take 1 mg by mouth 2 (two) times daily.  . famotidine (PEPCID) 20 MG tablet Take 1 tablet (20 mg total) by mouth 2 (two) times daily. (Patient taking differently: Take 20 mg by mouth daily. )  . Lemborexant (DAYVIGO) 10 MG TABS Take 5 mg by mouth at bedtime as needed (take 30-60 min before bedtime, must have more than 7 hours to sleep).  . Misc Natural Products (GLUCOSAMINE CHOND COMPLEX/MSM)  TABS Take 2 capsules by mouth daily.  . nitrofurantoin, macrocrystal-monohydrate, (MACROBID) 100 MG capsule Take 1 capsule (100 mg total) by mouth 2 (two) times daily.  . Probiotic Product ([employer] PROBIOTIC MAXIMUM STRENGTH) CAPS Take 1 capsule by mouth daily.  . Turmeric 500 MG CAPS Take 1,000 mg by mouth.   . venlafaxine XR (EFFEXOR-XR) 75 MG 24 hr capsule Take 75 mg by mouth daily.  . vitamin B-12 (CYANOCOBALAMIN) 1000 MCG tablet Take 1,000 mcg by mouth daily.   No facility-administered encounter medications on file as of 07/08/2020.    Star Rating Drugs:None ID   Called patient and discussed medication adherence  with patient, no issues at this time with current medication.   Patient reports ED visit since her last CPP follow up.   06/21/2020 Urgent care Visit for Urinary tract infection Patient denies any side effects with her medication. Patient denies any problems with her current pharmacy    Patient states her blood pressure was a little high but she believes it is due to her recent Urinary tract infection she had on 06/21/2020.  Patient reports she has a follow up coming up with her PCP to discuss her symptoms she is still having from the Urinary tract infection.  [place] Pharmacist Assistant [phone]

## 2020-07-19 ENCOUNTER — Telehealth: Payer: Self-pay

## 2020-07-19 NOTE — Telephone Encounter (Signed)
Copied from [place] [phone]. Topic: General - Call Back - No Documentation >> Jul 19, 2020  3:03 PM Loma Boston wrote: Pt is wanting a CB from [place] re labs before 4/7 appt so can discuss, no active request, also thinks possible UTI fu with pt at [phone]

## 2020-07-19 NOTE — Telephone Encounter (Signed)
Patient will need to wait til appointment

## 2020-07-22 ENCOUNTER — Other Ambulatory Visit: Payer: Self-pay

## 2020-07-22 ENCOUNTER — Encounter: Payer: Self-pay | Admitting: Family Medicine

## 2020-07-22 ENCOUNTER — Ambulatory Visit (INDEPENDENT_AMBULATORY_CARE_PROVIDER_SITE_OTHER): Payer: Medicare HMO | Admitting: Family Medicine

## 2020-07-22 VITALS — BP 134/78 | HR 67 | Temp 98.1°F | Resp 16 | Ht 65.0 in | Wt 214.5 lb

## 2020-07-22 DIAGNOSIS — R300 Dysuria: Secondary | ICD-10-CM

## 2020-07-22 DIAGNOSIS — E2839 Other primary ovarian failure: Secondary | ICD-10-CM | POA: Diagnosis not present

## 2020-07-22 DIAGNOSIS — E7849 Other hyperlipidemia: Secondary | ICD-10-CM

## 2020-07-22 DIAGNOSIS — R7303 Prediabetes: Secondary | ICD-10-CM

## 2020-07-22 DIAGNOSIS — R309 Painful micturition, unspecified: Secondary | ICD-10-CM | POA: Diagnosis not present

## 2020-07-22 DIAGNOSIS — Z1231 Encounter for screening mammogram for malignant neoplasm of breast: Secondary | ICD-10-CM

## 2020-07-22 DIAGNOSIS — E782 Mixed hyperlipidemia: Secondary | ICD-10-CM

## 2020-07-22 DIAGNOSIS — Z5181 Encounter for therapeutic drug level monitoring: Secondary | ICD-10-CM

## 2020-07-22 DIAGNOSIS — R69 Illness, unspecified: Secondary | ICD-10-CM | POA: Diagnosis not present

## 2020-07-22 DIAGNOSIS — K219 Gastro-esophageal reflux disease without esophagitis: Secondary | ICD-10-CM | POA: Diagnosis not present

## 2020-07-22 DIAGNOSIS — N1831 Chronic kidney disease, stage 3a: Secondary | ICD-10-CM

## 2020-07-22 DIAGNOSIS — I10 Essential (primary) hypertension: Secondary | ICD-10-CM | POA: Diagnosis not present

## 2020-07-22 DIAGNOSIS — F332 Major depressive disorder, recurrent severe without psychotic features: Secondary | ICD-10-CM

## 2020-07-22 DIAGNOSIS — F411 Generalized anxiety disorder: Secondary | ICD-10-CM

## 2020-07-22 LAB — POCT URINALYSIS DIPSTICK
Bilirubin, UA: NEGATIVE
Blood, UA: NEGATIVE
Glucose, UA: NEGATIVE
Ketones, UA: NEGATIVE
Nitrite, UA: NEGATIVE
Odor: NORMAL
Protein, UA: NEGATIVE
Spec Grav, UA: <=1.005 — AB (ref 1.010–1.025)
Urobilinogen, UA: 0.2 E.U./dL
pH, UA: 5 (ref 5.0–8.0)

## 2020-07-22 NOTE — Patient Instructions (Addendum)
We will call you with urine culture results Results for orders placed or performed in visit on 07/22/20  POCT urinalysis dipstick  Result Value Ref Range   Color, UA yellow    Clarity, UA clear    Glucose, UA Negative Negative   Bilirubin, UA neg    Ketones, UA neg    Spec Grav, UA <=1.005 (A) 1.010 - 1.025   Blood, UA neg    pH, UA 5.0 5.0 - 8.0   Protein, UA Negative Negative   Urobilinogen, UA 0.2 0.2 or 1.0 E.U./dL   Nitrite, UA neg    Leukocytes, UA Small (1+) (A) Negative   Appearance clear    Odor normal      Urinary Tract Infection, Adult A urinary tract infection (UTI) is an infection of any part of the urinary tract. The urinary tract includes:  The kidneys.  The ureters.  The bladder.  The urethra. These organs make, store, and get rid of pee (urine) in the body. What are the causes? This infection is caused by germs (bacteria) in your genital area. These germs grow and cause swelling (inflammation) of your urinary tract. What increases the risk? The following factors may make you more likely to develop this condition:  Using a small, thin tube (catheter) to drain pee.  Not being able to control when you pee or poop (incontinence).  Being female. If you are female, these things can increase the risk: ? Using these methods to prevent pregnancy:  A medicine that kills sperm (spermicide).  A device that blocks sperm (diaphragm). ? Having low levels of a female hormone (estrogen). ? Being pregnant. You are more likely to develop this condition if:  You have genes that add to your risk.  You are sexually active.  You take antibiotic medicines.  You have trouble peeing because of: ? A prostate that is bigger than normal, if you are female. ? A blockage in the part of your body that drains pee from the bladder. ? A kidney stone. ? A nerve condition that affects your bladder. ? Not getting enough to drink. ? Not peeing often enough.  You have other  conditions, such as: ? Diabetes. ? A weak disease-fighting system (immune system). ? Sickle cell disease. ? Gout. ? Injury of the spine. What are the signs or symptoms? Symptoms of this condition include:  Needing to pee right away.  Peeing small amounts often.  Pain or burning when peeing.  Blood in the pee.  Pee that smells bad or not like normal.  Trouble peeing.  Pee that is cloudy.  Fluid coming from the vagina, if you are female.  Pain in the belly or lower back. Other symptoms include:  Vomiting.  Not feeling hungry.  Feeling mixed up (confused). This may be the first symptom in older adults.  Being tired and grouchy (irritable).  A fever.  Watery poop (diarrhea). How is this treated?  Taking antibiotic medicine.  Taking other medicines.  Drinking enough water. In some cases, you may need to see a specialist. Follow these instructions at home: Medicines  Take over-the-counter and prescription medicines only as told by your doctor.  If you were prescribed an antibiotic medicine, take it as told by your doctor. Do not stop taking it even if you start to feel better. General instructions  Make sure you: ? Pee until your bladder is empty. ? Do not hold pee for a long time. ? Empty your bladder after sex. ? Wipe  from front to back after peeing or pooping if you are a female. Use each tissue one time when you wipe.  Drink enough fluid to keep your pee pale yellow.  Keep all follow-up visits.   Contact a doctor if:  You do not get better after 1-2 days.  Your symptoms go away and then come back. Get help right away if:  You have very bad back pain.  You have very bad pain in your lower belly.  You have a fever.  You have chills.  You feeling like you will vomit or you vomit. Summary  A urinary tract infection (UTI) is an infection of any part of the urinary tract.  This condition is caused by germs in your genital area.  There are  many risk factors for a UTI.  Treatment includes antibiotic medicines.  Drink enough fluid to keep your pee pale yellow. This information is not intended to replace advice given to you by your health care provider. Make sure you discuss any questions you have with your health care provider. Document Revised: 11/14/2019 Document Reviewed: 11/14/2019 Elsevier Patient Education  2021 [employer].   We can do labs with your next routine follow up appointment

## 2020-07-22 NOTE — Progress Notes (Signed)
Patient ID: Katherine Gould, female    DOB: 1951/01/08, 70 y.o.   MRN: [id number]  PCP: Delsa Grana, PA-C  Chief Complaint  Patient presents with  . Follow-up  . Urinary Tract Infection    Low back and bladder pain    Subjective:   Katherine Gould is a 70 y.o. female, presents to clinic with CC of the following:  HPI  UTI sx Results for orders placed or performed in visit on 07/22/20  POCT urinalysis dipstick  Result Value Ref Range   Color, UA yellow    Clarity, UA clear    Glucose, UA Negative Negative   Bilirubin, UA neg    Ketones, UA neg    Spec Grav, UA <=1.005 (A) 1.010 - 1.025   Blood, UA neg    pH, UA 5.0 5.0 - 8.0   Protein, UA Negative Negative   Urobilinogen, UA 0.2 0.2 or 1.0 E.U./dL   Nitrite, UA neg    Leukocytes, UA Small (1+) (A) Negative   Appearance clear    Odor normal    Urinary frequency, feels like when she had interstitial cystitis, bladder pain, comes and goes, associated with low back pain  Recent ER visit for urinary sx, urine culture showed mixed microbes and UA was contaminated - suggested recollect - was tx with bactrim, some mild temporary improvement  GERD- was on pepcid 20 mg bid prn, but tried to get off it for "her kidneys"  Does not have reflux she says, but gassiness, grumbling, bloating, changing in bowels with trying different probiotics and trying to get off pepcid.  She feels depressed similar to "always" did [place] and sees specialists and does therapy, on effexor , still severly depressed  HTN - on bisoprolol-HCTZ 10-6.25 once daily Blood pressure today is well controlled. BP Readings from Last 3 Encounters:  07/22/20 134/78  06/21/20 (!) 157/87  01/22/20 124/78       Patient Active Problem List   Diagnosis Date Noted  . Severe episode of recurrent major depressive disorder, without psychotic features ([place]) 11/14/2019  . Habitual self-excoriation 11/14/2019  . GAD (generalized anxiety disorder) 11/14/2019  . Attention  deficit hyperactivity disorder (ADHD), predominantly hyperactive type 11/14/2019  . Familial hyperlipidemia 08/21/2019  . Postmenopausal estrogen deficiency 04/08/2019  . GERD (gastroesophageal reflux disease) 12/10/2018  . OCD (obsessive compulsive disorder) 04/15/2018  . Morbid obesity ([place]) 04/15/2018  . Medication monitoring encounter 04/15/2018  . Vitamin B12 deficiency 04/15/2018  . Vitamin D deficiency 04/15/2018  . Bilateral primary osteoarthritis of knee 04/15/2018  . Interstitial cystitis 04/15/2018  . OSA (obstructive sleep apnea) 07/14/2015  . Hypertension   . Hyperlipidemia 07/03/2012  . Prediabetes 07/03/2012      Current Outpatient Medications:  .  bisoprolol-hydrochlorothiazide (ZIAC) 10-6.25 MG tablet, TAKE 1 TABLET BY MOUTH EVERY DAY, Disp: 90 tablet, Rfl: 3 .  Cholecalciferol (VITAMIN D3) 2000 UNITS capsule, Take 2,000 Units by mouth daily. Pt taking 4000 IU daily, Disp: , Rfl:  .  clonazePAM (KLONOPIN) 1 MG tablet, Take 1 mg by mouth 2 (two) times daily., Disp: , Rfl:  .  famotidine (PEPCID) 20 MG tablet, Take 20 mg by mouth 2 (two) times daily., Disp: , Rfl:  .  Misc Natural Products (GLUCOSAMINE CHOND COMPLEX/MSM) TABS, Take 2 capsules by mouth daily., Disp: , Rfl:  .  silver sulfADIAZINE (SILVADENE) 1 % cream, Apply topically., Disp: , Rfl:  .  Turmeric 500 MG CAPS, Take 1,000 mg by mouth. , Disp: ,  Rfl:  .  venlafaxine XR (EFFEXOR-XR) 75 MG 24 hr capsule, Take 75 mg by mouth daily., Disp: , Rfl:  .  vitamin B-12 (CYANOCOBALAMIN) 1000 MCG tablet, Take 1,000 mcg by mouth daily., Disp: , Rfl:    Allergies  Allergen Reactions  . Aripiprazole Other (See Comments)    Incubus Dream tremmors   . Quetiapine Other (See Comments)    Incubus dream     Social History   Tobacco Use  . Smoking status: Never Smoker  . Smokeless tobacco: Never Used  Vaping Use  . Vaping Use: Never used  Substance Use Topics  . Alcohol use: No  . Drug use: No      Chart  Review Today: I personally reviewed active problem list, medication list, allergies, family history, social history, health maintenance, notes from last encounter, lab results, imaging with the patient/caregiver today.   Review of Systems  Constitutional: Negative.   HENT: Negative.   Eyes: Negative.   Respiratory: Negative.   Cardiovascular: Negative.   Gastrointestinal: Negative.   Endocrine: Negative.   Genitourinary: Negative.   Musculoskeletal: Negative.   Skin: Negative.   Allergic/Immunologic: Negative.   Neurological: Negative.   Hematological: Negative.   Psychiatric/Behavioral: Negative.   All other systems reviewed and are negative.      Objective:   Vitals:   07/22/20 1437  BP: 134/78  Pulse: 67  Resp: 16  Temp: 98.1 F (36.7 C)  SpO2: 98%  Weight: 214 lb 8 oz (97.3 kg)  Height: 5\' 5"  (1.651 m)    Body mass index is 35.69 kg/m.  Physical Exam Vitals and nursing note reviewed.  Constitutional:      General: She is not in acute distress.    Appearance: She is obese. She is not ill-appearing, toxic-appearing or diaphoretic.  HENT:     Head: Normocephalic and atraumatic.  Eyes:     General:        Right eye: No discharge.        Left eye: No discharge.     Conjunctiva/sclera: Conjunctivae normal.  Cardiovascular:     Rate and Rhythm: Normal rate and regular rhythm.     Pulses: Normal pulses.     Heart sounds: Normal heart sounds.  Pulmonary:     Effort: Pulmonary effort is normal. No respiratory distress.     Breath sounds: Normal breath sounds.  Abdominal:     General: Bowel sounds are normal. There is no distension.     Palpations: Abdomen is soft.     Tenderness: There is no abdominal tenderness. There is no right CVA tenderness, left CVA tenderness, guarding or rebound.  Skin:    General: Skin is warm and dry.     Coloration: Skin is not jaundiced.     Findings: No lesion or rash.  Neurological:     Mental Status: She is alert. Mental  status is at baseline.  Psychiatric:        Mood and Affect: Mood is depressed (baseline for pt).      Results for orders placed or performed in visit on 07/22/20  POCT urinalysis dipstick  Result Value Ref Range   Color, UA yellow    Clarity, UA clear    Glucose, UA Negative Negative   Bilirubin, UA neg    Ketones, UA neg    Spec Grav, UA <=1.005 (A) 1.010 - 1.025   Blood, UA neg    pH, UA 5.0 5.0 - 8.0   Protein, UA Negative  Negative   Urobilinogen, UA 0.2 0.2 or 1.0 E.U./dL   Nitrite, UA neg    Leukocytes, UA Small (1+) (A) Negative   Appearance clear    Odor normal        Assessment & Plan:     ICD-10-CM   1. Dysuria  R30.0 POCT urinalysis dipstick    Urine Culture   hx of interstitial cystitis, UA with +leuk and otherwise unremarkable, no abd ttp no CVA tenderness, VSS, discussed waiting for culture results for abx  2. Gastroesophageal reflux disease without esophagitis  K21.9    some bloating, belching - continue avoiding food triggers and lifestyle efforts, reassured pt pepcid bid prn is safe for her   3. Encounter for screening mammogram for malignant neoplasm of breast  Z12.31 MM 3D SCREEN BREAST BILATERAL  4. Estrogen deficiency  E28.39 DG Bone Density  5. Severe episode of recurrent major depressive disorder, without psychotic features ([place])  F33.2   6. Stage 3a chronic kidney disease (HCC)  N18.31    last labs showed normal renal function  - monitoring  7. Familial hyperlipidemia  E78.49    refuses meds, wants labs checked again - labs for July to august 2022 appt  8. Mixed hyperlipidemia  E78.2    refuses meds, no improvement with diet/lifestyle efforts, wants labs checked again - labs for July to august 2022 appt  9. Essential hypertension  I10    BP at goal today, well controlled on current meds, con't f/up in 3-4 months, labs can be done prior to appt, reviewed last labs/renal fx/electrolytes  10. Prediabetes  R73.03    recheck at next appt  11.  Encounter for medication monitoring  Z51.81     PHQ positive - baseline for pt, seeing multiple specialists She also wanted to address insomnia, which I have attempted to address in the past - she should continue to address with psychiatry, I have tried everything I am able to do for insomnia, meds have not been approved, and it really is a multifactorial behavioral/psychiatric issue that is chronic and unchanged for many years.  Pt wanted to do acute, and all chronic and then additional labs today including Vit D - I reassured her I had reviewed all her labs, her BP is at goal today, she could do w/o additional blood work. After she left, unhappy about no need for labs today, I further reviewed chart and she has normal Vit D, no deficiency, insurance will not pay for Vit D, and there is no medical indication for screening vit D labs. I did put in her labs for all routine dx and they can be done in the next 3-4 months  I explained that today we did her acute complaints and she will need to return for routine conditions or get additional and separate appts for multiple additional complaints and concerns, and I did review labs, meds, VS and her other chronic conditions and I have no concerns with anything warranting labs today, and no recent or prior labs that require close monitoring.      Delsa Grana, PA-C 07/22/20 2:46 PM

## 2020-07-23 LAB — URINE CULTURE
MICRO NUMBER:: 11744586
Result:: NO GROWTH
SPECIMEN QUALITY:: ADEQUATE

## 2020-07-26 ENCOUNTER — Telehealth: Payer: Self-pay | Admitting: Family Medicine

## 2020-07-26 NOTE — Telephone Encounter (Signed)
Copied from [place] [id number]. Topic: Medicare AWV >> Jul 26, 2020  1:49 PM Cher Nakai R wrote: Reason for CRM:   No answer unable to leave a message for patient to call back and schedule Medicare Annual Wellness Visit (AWV) in office.   If unable to come into the office for AWV,  please offer to do virtually or by telephone.  Last AWV: 04/24/2019  Please schedule at anytime with [place].  40 minute appointment  Any questions, please contact me at [phone]

## 2020-08-18 DIAGNOSIS — F332 Major depressive disorder, recurrent severe without psychotic features: Secondary | ICD-10-CM | POA: Diagnosis not present

## 2020-08-18 DIAGNOSIS — R69 Illness, unspecified: Secondary | ICD-10-CM | POA: Diagnosis not present

## 2020-09-01 ENCOUNTER — Telehealth: Payer: Self-pay | Admitting: Family Medicine

## 2020-09-01 NOTE — Telephone Encounter (Signed)
Pts last appt was 07/22/20

## 2020-09-01 NOTE — Telephone Encounter (Signed)
Requested medication (s) are due for refill today: historical medication  Requested medication (s) are on the active medication list: yes  Last refill:  05/19/20  Future visit scheduled: no   Notes to clinic:  historical medication, med not assigned to a protocol    Requested Prescriptions  Pending Prescriptions Disp Refills   silver sulfADIAZINE (SILVADENE) 1 % cream 50 g 0    Sig: Apply topically.      Off-Protocol Failed - 09/01/2020  1:26 PM      Failed - Medication not assigned to a protocol, review manually.      Passed - Valid encounter within last 12 months    Recent Outpatient Visits           1 month ago [facility] Delsa Grana, PA-C   7 months ago Essential hypertension   [facility] Delsa Grana, [place]   9 months ago Essential hypertension   [facility] Steele Sizer, MD   1 year ago Familial hyperlipidemia   [facility] Delsa Grana, PA-C   1 year ago Urinary frequency   [facility] Delsa Grana, [place]

## 2020-09-01 NOTE — Telephone Encounter (Signed)
Medication Refill - Medication: silver sulfADIAZINE (SILVADENE) 1 % cream   Has the patient contacted their pharmacy? Yes.   (Agent: If no, request that the patient contact the pharmacy for the refill.) (Agent: If yes, when and what did the pharmacy advise?)requested   Preferred Pharmacy (with phone number or street name): [id number] - [place], [place] [address]  [address] [place] [address]  Phone:  [phone] Fax:  [phone]   Agent: Please be advised that RX refills may take up to 3 business days. We ask that you follow-up with your pharmacy.

## 2020-09-03 NOTE — Telephone Encounter (Signed)
Pt called in regarding this medication again. Spoke with Cassandra in office and was advised that PCP and nurse are out of office. Pt was advised of this info and was very disappointed. Pt states that she has been out of cream for days and states that she does not feel that her health is being taken seriously. Pt states that she will be contacting patient experience as this is not the first time that she has had trouble. Please advise.

## 2020-09-03 NOTE — Telephone Encounter (Signed)
Pt called stating that she is completely out of this medication. She is requesting to have a call back to know why the medication was denied. Please advise.

## 2020-09-06 NOTE — Telephone Encounter (Signed)
appt sch'd with Malachy Mood

## 2020-09-06 NOTE — Telephone Encounter (Signed)
Pt called and states that she has been using  silver sulfADIAZINE (SILVADENE) 1 % cream  For 25 years and it was prescribed from her last PCP at [facility] and [facility] dermatology before that/ Pt uses this for her skin / due to picking at her arms and legs / this helps tremendously with that issue and her OCD/ Pt has scheduled an appt for this Thursday to have skin looked at and to get an RX or refill

## 2020-09-06 NOTE — Telephone Encounter (Signed)
Left detailed vm that Katherine Gould did not  Prescribe this med and usually does not prescribe.  She has not been seen for this med so most likely will not refill.

## 2020-09-09 ENCOUNTER — Ambulatory Visit: Payer: Medicare HMO | Admitting: Unknown Physician Specialty

## 2020-09-10 ENCOUNTER — Ambulatory Visit: Payer: Medicare HMO | Admitting: Family Medicine

## 2020-09-21 ENCOUNTER — Other Ambulatory Visit: Payer: Self-pay

## 2020-09-21 ENCOUNTER — Encounter: Payer: Self-pay | Admitting: Unknown Physician Specialty

## 2020-09-21 ENCOUNTER — Ambulatory Visit (INDEPENDENT_AMBULATORY_CARE_PROVIDER_SITE_OTHER): Payer: Medicare HMO | Admitting: Unknown Physician Specialty

## 2020-09-21 VITALS — BP 124/68 | HR 69 | Temp 97.9°F | Resp 18 | Ht 65.0 in | Wt 214.9 lb

## 2020-09-21 DIAGNOSIS — L97919 Non-pressure chronic ulcer of unspecified part of right lower leg with unspecified severity: Secondary | ICD-10-CM

## 2020-09-21 DIAGNOSIS — I10 Essential (primary) hypertension: Secondary | ICD-10-CM

## 2020-09-21 DIAGNOSIS — L97929 Non-pressure chronic ulcer of unspecified part of left lower leg with unspecified severity: Secondary | ICD-10-CM

## 2020-09-21 DIAGNOSIS — R5382 Chronic fatigue, unspecified: Secondary | ICD-10-CM | POA: Diagnosis not present

## 2020-09-21 DIAGNOSIS — E782 Mixed hyperlipidemia: Secondary | ICD-10-CM

## 2020-09-21 MED ORDER — SILVER SULFADIAZINE 1 % EX CREA: TOPICAL_CREAM | Freq: Two times a day (BID) | CUTANEOUS | 0 refills | Status: DC

## 2020-09-21 NOTE — Assessment & Plan Note (Signed)
Elevated last visit.  Will recheck today

## 2020-09-21 NOTE — Progress Notes (Signed)
BP 124/68   Pulse 69   Temp 97.9 F (36.6 C)   Resp 18   Ht 5\' 5"  (1.651 m)   Wt 214 lb 14.4 oz (97.5 kg)   SpO2 96%   BMI 35.76 kg/m    Subjective:    Patient ID: Katherine Gould, female    DOB: 03-30-1951, 70 y.o.   MRN: [id number]  HPI: Katherine Gould is a 70 y.o. female  Chief Complaint  Patient presents with  . Medication Refill    Silver sulfadiazine   Pt with sores on legs due to anxiety and picking at her legs.  The Sulfur was used years ago and it seems to work.  She has had extensive treatment to treat anxiety.    Pt needs f/u of some labs. She has high cholesterol and lack of f/u.  She is also fatigued, stays in bed much of the time.  She is more fatigued and depressed lately     Depression screen [facility] 2/9 09/21/2020 07/22/2020 01/22/2020 11/14/2019 08/15/2019  Decreased Interest 3 3 3 3 3   Down, Depressed, Hopeless 3 3 3 3 3   PHQ - 2 Score 6 6 6 6 6   Altered sleeping 3 3 3 3 3   Tired, decreased energy 3 3 3 3 3   Change in appetite 0 3 3 3  0  Feeling bad or failure about yourself  0 3 3 3  0  Trouble concentrating 0 3 3 3  0  Moving slowly or fidgety/restless 0 3 3 3  0  Suicidal thoughts 0 2 3 3  0  PHQ-9 Score 12 26 27 27 12   Difficult doing work/chores Somewhat difficult Very difficult - Somewhat difficult Somewhat difficult  Some recent data might be hidden      Relevant past medical, surgical, family and social history reviewed and updated as indicated. Interim medical history since our last visit reviewed. Allergies and medications reviewed and updated.  Review of Systems  Per HPI unless specifically indicated above     Objective:    BP 124/68   Pulse 69   Temp 97.9 F (36.6 C)   Resp 18   Ht 5\' 5"  (1.651 m)   Wt 214 lb 14.4 oz (97.5 kg)   SpO2 96%   BMI 35.76 kg/m   Wt Readings from Last 3 Encounters:  09/21/20 214 lb 14.4 oz (97.5 kg)  07/22/20 214 lb 8 oz (97.3 kg)  06/21/20 218 lb (98.9 kg)    Physical Exam Constitutional:      General:  She is not in acute distress.    Appearance: Normal appearance. She is well-developed.  HENT:     Head: Normocephalic and atraumatic.  Eyes:     General: Lids are normal. No scleral icterus.       Right eye: No discharge.        Left eye: No discharge.     Conjunctiva/sclera: Conjunctivae normal.  Neck:     Vascular: No carotid bruit or JVD.  Cardiovascular:     Rate and Rhythm: Normal rate and regular rhythm.     Heart sounds: Normal heart sounds.  Pulmonary:     Effort: Pulmonary effort is normal.     Breath sounds: Normal breath sounds.  Abdominal:     Palpations: There is no hepatomegaly or splenomegaly.  Musculoskeletal:        General: Normal range of motion.     Cervical back: Normal range of motion and neck supple.  Skin:  General: Skin is warm and dry.     Coloration: Skin is not pale.     Findings: No rash.  Neurological:     Mental Status: She is alert and oriented to person, place, and time.  Psychiatric:        Behavior: Behavior normal.        Thought Content: Thought content normal.        Judgment: Judgment normal.     Results for orders placed or performed in visit on 07/22/20  Urine Culture   Specimen: Urine  Result Value Ref Range   MICRO NUMBER: [id number]    SPECIMEN QUALITY: Adequate    Sample Source URINE    STATUS: FINAL    Result: No Growth   POCT urinalysis dipstick  Result Value Ref Range   Color, UA yellow    Clarity, UA clear    Glucose, UA Negative Negative   Bilirubin, UA neg    Ketones, UA neg    Spec Grav, UA <=1.005 (A) 1.010 - 1.025   Blood, UA neg    pH, UA 5.0 5.0 - 8.0   Protein, UA Negative Negative   Urobilinogen, UA 0.2 0.2 or 1.0 E.U./dL   Nitrite, UA neg    Leukocytes, UA Small (1+) (A) Negative   Appearance clear    Odor normal       Assessment & Plan:   Problem List Items Addressed This Visit      Unprioritized   Chronic fatigue    Worse lately.  Will check labs      Relevant Orders   Comprehensive  metabolic panel   TSH   CBC with Differential/Platelet   VITAMIN D 25 Hydroxy (Vit-D Deficiency, Fractures)   Hyperlipidemia (Chronic)    Elevated last visit.  Will recheck today      Relevant Orders   Lipid panel   Hypertension (Chronic)    Stable, continue present medications.        Relevant Orders   Comprehensive metabolic panel    Other Visit Diagnoses    Ulcers of both lower legs ([place])    -  Primary   Mild today.  Will order the sulfur cream       Follow up plan: Return if symptoms worsen or fail to improve.

## 2020-09-21 NOTE — Assessment & Plan Note (Signed)
Worse lately.  Will check labs

## 2020-09-21 NOTE — Assessment & Plan Note (Signed)
Stable, continue present medications.   

## 2020-09-22 LAB — COMPREHENSIVE METABOLIC PANEL
AG Ratio: 1.5 (calc) (ref 1.0–2.5)
ALT: 9 U/L (ref 6–29)
AST: 11 U/L (ref 10–35)
Albumin: 4.2 g/dL (ref 3.6–5.1)
Alkaline phosphatase (APISO): 67 U/L (ref 37–153)
BUN: 17 mg/dL (ref 7–25)
CO2: 29 mmol/L (ref 20–32)
Calcium: 9.6 mg/dL (ref 8.6–10.4)
Chloride: 104 mmol/L (ref 98–110)
Creat: 0.98 mg/dL (ref 0.50–0.99)
Globulin: 2.8 g/dL (calc) (ref 1.9–3.7)
Glucose, Bld: 86 mg/dL (ref 65–99)
Potassium: 4.2 mmol/L (ref 3.5–5.3)
Sodium: 141 mmol/L (ref 135–146)
Total Bilirubin: 0.5 mg/dL (ref 0.2–1.2)
Total Protein: 7 g/dL (ref 6.1–8.1)

## 2020-09-22 LAB — LIPID PANEL
Cholesterol: 225 mg/dL — ABNORMAL HIGH (ref <200)
HDL: 51 mg/dL (ref >=50)
LDL Cholesterol (Calc): 143 mg/dL (calc) — ABNORMAL HIGH
Non-HDL Cholesterol (Calc): 174 mg/dL (calc) — ABNORMAL HIGH (ref <130)
Total CHOL/HDL Ratio: 4.4 (calc) (ref <5.0)
Triglycerides: 174 mg/dL — ABNORMAL HIGH (ref <150)

## 2020-09-22 LAB — CBC WITH DIFFERENTIAL/PLATELET
Absolute Monocytes: 574 cells/uL (ref 200–950)
Basophils Absolute: 53 cells/uL (ref 0–200)
Basophils Relative: 0.8 %
Eosinophils Absolute: 139 cells/uL (ref 15–500)
Eosinophils Relative: 2.1 %
HCT: 42.2 % (ref 35.0–45.0)
Hemoglobin: 14 g/dL (ref 11.7–15.5)
Lymphs Abs: 2825 cells/uL (ref 850–3900)
MCH: 30.4 pg (ref 27.0–33.0)
MCHC: 33.2 g/dL (ref 32.0–36.0)
MCV: 91.7 fL (ref 80.0–100.0)
MPV: 10.3 fL (ref 7.5–12.5)
Monocytes Relative: 8.7 %
Neutro Abs: 3010 cells/uL (ref 1500–7800)
Neutrophils Relative %: 45.6 %
Platelets: 263 10*3/uL (ref 140–400)
RBC: 4.6 10*6/uL (ref 3.80–5.10)
RDW: 13.3 % (ref 11.0–15.0)
Total Lymphocyte: 42.8 %
WBC: 6.6 10*3/uL (ref 3.8–10.8)

## 2020-09-22 LAB — TSH: TSH: 1.69 mIU/L (ref 0.40–4.50)

## 2020-09-23 ENCOUNTER — Telehealth: Payer: Self-pay | Admitting: *Deleted

## 2020-09-23 NOTE — Telephone Encounter (Signed)
Patient is calling to report she has been considering advise given for PCP:  Please let her know:  Here are your labs.  As you can see, your cholesterol is still elevated.  Your risk score is below.  Would you be willing to try a cholesterol medication to reduce your risk?  We usually recommend a cholesterolmedication if your risk is above 8%  Patient states she has depression and she has not been motivated to move and be active. Or follow diet like she should. She does go to therapy. Advised patient- she can see nutritionalist for diet and possibly have medication that is not a statin. Patient is interested in any alternative treatments.

## 2020-10-04 ENCOUNTER — Other Ambulatory Visit: Payer: Self-pay | Admitting: Family Medicine

## 2020-10-04 DIAGNOSIS — I10 Essential (primary) hypertension: Secondary | ICD-10-CM

## 2020-11-04 ENCOUNTER — Telehealth: Payer: Self-pay

## 2020-11-04 NOTE — Progress Notes (Signed)
Chronic Care Management Pharmacy Assistant   Name: Katherine Gould  MRN: [id number] DOB: [birth year]  Reason for Encounter: Hypertension Disease State Call   Recent office visits:  09/21/2020 Kathrine Haddock, NP (PCP Office Visit) for Medication Refill- No Medication changes indicated  07/22/2020 Delsa Grana PA-C (PCP Office Visit)- Follow-up for UTI- Famotidine was changed to 20 mg daily, Discontinued: Lemborexant 5 mg, Nitrofurantoin 100 mg; labs ordered  Recent consult visits:  No recent consult visits  Hospital visits:  None in previous 6 months  Medications: Outpatient Encounter Medications as of 11/04/2020  Medication Sig   bisoprolol-hydrochlorothiazide (ZIAC) 10-6.25 MG tablet TAKE 1 TABLET BY MOUTH EVERY DAY   Cholecalciferol (VITAMIN D3) 2000 UNITS capsule Take 2,000 Units by mouth daily. Pt taking 4000 IU daily   clonazePAM (KLONOPIN) 1 MG tablet Take 1 mg by mouth 2 (two) times daily.   famotidine (PEPCID) 20 MG tablet Take 20 mg by mouth 2 (two) times daily.   Misc Natural Products (GLUCOSAMINE CHOND COMPLEX/MSM) TABS Take 2 capsules by mouth daily.   silver sulfADIAZINE (SILVADENE) 1 % cream Apply topically 2 (two) times daily.   Turmeric 500 MG CAPS Take 1,000 mg by mouth.    venlafaxine XR (EFFEXOR-XR) 75 MG 24 hr capsule Take 75 mg by mouth daily.   vitamin B-12 (CYANOCOBALAMIN) 1000 MCG tablet Take 1,000 mcg by mouth daily.   No facility-administered encounter medications on file as of 11/04/2020.   Care Gaps: Zoster Vaccines- Shingrix  COVID-19 Vaccine Booster 4 DEXA SCAN MAMMOGRAM  Star Rating Drugs: None  Reviewed chart prior to disease state call. Spoke with patient regarding BP  Recent Office Vitals: BP Readings from Last 3 Encounters:  09/21/20 124/68  07/22/20 134/78  06/21/20 (!) 157/87   Pulse Readings from Last 3 Encounters:  09/21/20 69  07/22/20 67  06/21/20 (!) 58    Wt Readings from Last 3 Encounters:  09/21/20 214 lb 14.4 oz (97.5  kg)  07/22/20 214 lb 8 oz (97.3 kg)  06/21/20 218 lb (98.9 kg)     Kidney Function Lab Results  Component Value Date/Time   CREATININE 0.98 09/21/2020 02:51 PM   CREATININE 0.92 11/12/2019 10:22 AM   GFRNONAA 64 11/12/2019 10:22 AM   GFRAA 74 11/12/2019 10:22 AM    BMP Latest Ref Rng & Units 09/21/2020 11/12/2019 07/31/2019  Glucose 65 - 99 mg/dL 86 106(H) 90  BUN 7 - 25 mg/dL 17 11 18   Creatinine 0.50 - 0.99 mg/dL 0.98 0.92 0.94  BUN/Creat Ratio 6 - 22 (calc) NOT APPLICABLE NOT APPLICABLE NOT APPLICABLE  Sodium 347 - 146 mmol/L 141 143 142  Potassium 3.5 - 5.3 mmol/L 4.2 4.0 4.4  Chloride 98 - 110 mmol/L 104 108 103  CO2 20 - 32 mmol/L 29 27 29   Calcium 8.6 - 10.4 mg/dL 9.6 9.1 9.7    Current antihypertensive regimen:  Bisoprolol-HCTZ 10-6.25 1 tablet daily  How often are you checking your Blood Pressure?  Patient does not take her BP at home due to her anxiety.   Current home BP readings: 134/78 last office visit  What recent interventions/DTPs have been made by any provider to improve Blood Pressure control since last CPP Visit: None ID  Any recent hospitalizations or ED visits since last visit with CPP? No  What diet changes have been made to improve Blood Pressure Control?  Patient reports that she is over weight and doesn't really watch her diet per say. She loves animals so she  doesn't really eat meat. She eats a lot of fish, vegetables, fruits. She does love spaghetti with no meat. Patient reports that she doesn't cook at all she eats out or have a frozen meal. She likes pizza with spinach, garlic, and mushrooms. She does eat the beyond meat.   What exercise is being done to improve your Blood Pressure Control?  Patient doesn't really exercise she repots that she does get off balance easily, she reports that she has fallen 8 times in the last year. She just had a fall on Monday where she got off balance and fell into her closet. She denies any injuries from this fall.  Patient reports that when she walks a long period of time her legs seem to get weak.   Adherence Review: Is the patient currently on ACE/ARB medication? No Does the patient have >5 day gap between last estimated fill dates? No  Patient states that she does have severe mental health issues, and very bad anxiety. Patient does go to therapy to deal with her issues. She stated that she does have some child Priola Trauma and she does have a hard time sleeping due to nightmares so she doesn't sleep much due to Post Traumatic Stress. Patient reports that her sister passed about 4 years ago, she has one sister in [place] that she never sees but she does speak to her on a regular basis, she has a brother in the same town as she is but she doesn't see him, and she has a daughter and one grandson. She reports that both of her parents are deceased and although it's been years she misses them tremendously. She denies any issues with her medications but she hates taking medications she prefers more holistic measures, but she reports she does take her blood pressure medications daily, her Antidepressant, Klonopin and her Effoxer. She prefers to just not have to take medications at all and has tried in the past to wean herself off but has been unsuccessful. She reports that she is using silver sulfadiazine due to she picks her skin due to OCD condition. Patient also reports that she does have ADHD as well so she does have a hard time focusing sometimes. She does not take a prescription medication for this at this time, but she is looking for a supplement that may assist vs a prescribed medication.  Patient states that her cholesterol is a little elevated but she will not take a statin she doesn't like the side effects. Patient also reports that she thought it would improve with her not eating meat but it did elevate and it was due to her increase in carbs and sugars she feels. She reports that she has a full bottle of  Atorvastatin but she will not take it.   08/25 @ 1400 scheduled with Junius Argyle, [place], [place] Pharmacist Assistant Phone: [phone]

## 2020-11-19 ENCOUNTER — Telehealth: Payer: Self-pay | Admitting: Family Medicine

## 2020-11-19 NOTE — Telephone Encounter (Signed)
Copied from [place] [phone]. Topic: Medicare AWV >> Nov 19, 2020 10:29 AM Cher Nakai R wrote: Reason for CRM:  No answer unable to leave a message for patient to call back and schedule Medicare Annual Wellness Visit (AWV) in office.   If unable to come into the office for AWV,  please offer to do virtually or by telephone.  Last AWV: 04/24/2019  Please schedule at anytime with [place].  40 minute appointment  Any questions, please contact me at [phone]

## 2020-11-26 DIAGNOSIS — R69 Illness, unspecified: Secondary | ICD-10-CM | POA: Diagnosis not present

## 2020-11-26 DIAGNOSIS — F332 Major depressive disorder, recurrent severe without psychotic features: Secondary | ICD-10-CM | POA: Diagnosis not present

## 2020-12-02 NOTE — Telephone Encounter (Signed)
Patient would like a return call to schedule an appointment. [facility] # [phone]

## 2020-12-03 ENCOUNTER — Telehealth: Payer: Self-pay | Admitting: Family Medicine

## 2020-12-03 NOTE — Telephone Encounter (Signed)
Returned pt call  

## 2020-12-03 NOTE — Telephone Encounter (Signed)
Copied from [place] [phone]. Topic: Medicare AWV >> Dec 03, 2020 10:51 AM Cher Nakai R wrote: Reason for CRM:  Left message for patient to call back and schedule Medicare Annual Wellness Visit (AWV) in office.   If unable to come into the office for AWV,  please offer to do virtually or by telephone.  Last AWV: 04/24/2019  Please schedule at anytime with [place].  40 minute appointment  Any questions, please contact me at [phone]

## 2020-12-08 ENCOUNTER — Telehealth: Payer: Self-pay

## 2020-12-08 NOTE — Progress Notes (Signed)
    Chronic Care Management Pharmacy Assistant   Name: SORAYA SANTELLANO  MRN: [id number] DOB: 1950/08/12  Patient called to be reminded of her appointment with Junius Argyle, CPP on 12/09/2020 @ 1400 via telephone.  No answer, left message of appointment date, time and type of appointment (either telephone or in person). Left message to have all medications, supplements, blood pressure and/or blood sugar logs available during appointment and to return call if need to reschedule.  Star Rating Drug: None ID  Any gaps in medications fill history? No  Care Gaps: Zoster Vaccines-Shingrix COVID-19 Vaccine Booster 4 Influenza Vaccine (last completed 01/22/2020)  Lynann Bologna, CPA/CMA Clinical Pharmacist Assistant Phone: [phone]

## 2020-12-09 ENCOUNTER — Telehealth: Payer: Self-pay

## 2020-12-09 NOTE — Progress Notes (Deleted)
Chronic Care Management Pharmacy Note  12/09/2020 Name:  Katherine Gould MRN:  [id number] DOB:  1950/08/23  Summary: ***  Recommendations/Changes made from today's visit: ***  Plan: ***   Subjective: Katherine Gould is an 70 y.o. year old female who is a primary patient of Delsa Grana, [place].  The CCM team was consulted for assistance with disease management and care coordination needs.    {CCMTELEPHONEFACETOFACE:21091510} for {CCMINITIALFOLLOWUPCHOICE:21091511} in response to provider referral for pharmacy case management and/or care coordination services.   Consent to Services:  {CCMCONSENTOPTIONS:25074}  Patient Care Team: Delsa Grana, PA-C as PCP - General (Family Medicine) Verdia Kuba, [facility] (Inactive) (Pharmacist)  Recent office visits: ***  Recent consult visits: [facility] visits: {Hospital DC Yes/No:25215}   Objective:  Lab Results  Component Value Date   CREATININE 0.98 09/21/2020   BUN 17 09/21/2020   GFRNONAA 64 11/12/2019   GFRAA 74 11/12/2019   NA 141 09/21/2020   K 4.2 09/21/2020   CALCIUM 9.6 09/21/2020   CO2 29 09/21/2020   GLUCOSE 86 09/21/2020    Lab Results  Component Value Date/Time   HGBA1C 5.8 (H) 07/31/2019 09:07 AM   HGBA1C 5.9 (H) 04/09/2019 12:00 AM    Last diabetic Eye exam: No results found for: HMDIABEYEEXA  Last diabetic Foot exam: No results found for: HMDIABFOOTEX   Lab Results  Component Value Date   CHOL 225 (H) 09/21/2020   HDL 51 09/21/2020   LDLCALC 143 (H) 09/21/2020   TRIG 174 (H) 09/21/2020   CHOLHDL 4.4 09/21/2020    Hepatic Function Latest Ref Rng & Units 09/21/2020 11/12/2019 07/31/2019  Total Protein 6.1 - 8.1 g/dL 7.0 6.5 6.9  AST 10 - 35 U/L '11 11 13  '$ ALT 6 - 29 U/L '9 7 10  '$ Total Bilirubin 0.2 - 1.2 mg/dL 0.5 0.5 0.4    Lab Results  Component Value Date/Time   TSH 1.69 09/21/2020 02:51 PM   TSH 3.54 04/15/2018 09:19 AM    CBC Latest Ref Rng & Units 09/21/2020 07/31/2019 04/09/2019  WBC 3.8 -  10.8 Thousand/uL 6.6 6.0 5.9  Hemoglobin 11.7 - 15.5 g/dL 14.0 14.9 14.6  Hematocrit 35.0 - 45.0 % 42.2 44.3 43.0  Platelets 140 - 400 Thousand/uL 263 267 220    Lab Results  Component Value Date/Time   VD25OH 67 04/09/2019 12:00 AM   VD25OH 46 04/15/2018 09:19 AM    Clinical ASCVD: {YES/NO:21197} The 10-year ASCVD risk score Mikey Bussing DC Jr., et al., 2013) is: 12.5%   Values used to calculate the score:     Age: 46 years     Sex: Female     Is Non-Hispanic African American: No     Diabetic: No     Tobacco smoker: No     Systolic Blood Pressure: [id number] mmHg     Is BP treated: Yes     HDL Cholesterol: 51 mg/dL     Total Cholesterol: 225 mg/dL    Depression screen [facility] 2/9 09/21/2020 07/22/2020 01/22/2020  Decreased Interest '3 3 3  '$ Down, Depressed, Hopeless '3 3 3  '$ PHQ - 2 Score '6 6 6  '$ Altered sleeping '3 3 3  '$ Tired, decreased energy '3 3 3  '$ Change in appetite 0 3 3  Feeling bad or failure about yourself  0 3 3  Trouble concentrating 0 3 3  Moving slowly or fidgety/restless 0 3 3  Suicidal thoughts 0 2 3  PHQ-9 Score '12 26 27  '$ Difficult doing  work/chores Somewhat difficult Very difficult -  Some recent data might be hidden     ***Other: (CHADS2VASc if Afib, MMRC or CAT for COPD, ACT, DEXA)  Social History   Tobacco Use  Smoking Status Never  Smokeless Tobacco Never   BP Readings from Last 3 Encounters:  09/21/20 124/68  07/22/20 134/78  06/21/20 (!) 157/87   Pulse Readings from Last 3 Encounters:  09/21/20 69  07/22/20 67  06/21/20 (!) 58   Wt Readings from Last 3 Encounters:  09/21/20 214 lb 14.4 oz (97.5 kg)  07/22/20 214 lb 8 oz (97.3 kg)  06/21/20 218 lb (98.9 kg)   BMI Readings from Last 3 Encounters:  09/21/20 35.76 kg/m  07/22/20 35.69 kg/m  06/21/20 36.28 kg/m    Assessment/Interventions: Review of patient past medical history, allergies, medications, health status, including review of consultants reports, laboratory and other test data, was performed as  part of comprehensive evaluation and provision of chronic care management services.   SDOH:  (Social Determinants of Health) assessments and interventions performed: {yes/no:20286}  SDOH Screenings   Alcohol Screen: Low Risk    Last Alcohol Screening Score (AUDIT): 0  Depression (PHQ2-9): Medium Risk   PHQ-2 Score: 12  Financial Resource Strain: Not on file  Food Insecurity: Not on file  Housing: Not on file  Physical Activity: Not on file  Social Connections: Not on file  Stress: Not on file  Tobacco Use: Low Risk    Smoking Tobacco Use: Never   Smokeless Tobacco Use: Never  Transportation Needs: Not on file    CCM Care Plan  Allergies  Allergen Reactions   Aripiprazole Other (See Comments)    Incubus Dream tremmors    Quetiapine Other (See Comments)    Incubus dream    Medications Reviewed Today     Reviewed by Kathrine Haddock, NP (Family Nurse Practitioner) on 09/21/20 at 1445  Med List Status: <None>   Medication Order Taking? Sig Documenting Provider Last Dose Status Informant  bisoprolol-hydrochlorothiazide (ZIAC) 10-6.25 MG tablet [id number] Yes TAKE 1 TABLET BY MOUTH EVERY DAY Delsa Grana, PA-C Taking Active   Cholecalciferol (VITAMIN D3) 2000 UNITS capsule [id number] Yes Take 2,000 Units by mouth daily. Pt taking 4000 IU daily [provider] Taking Active   clonazePAM (KLONOPIN) 1 MG tablet [id number] Yes Take 1 mg by mouth 2 (two) times daily. Myer Haff, MD Taking Active   famotidine (PEPCID) 20 MG tablet [id number] Yes Take 20 mg by mouth 2 (two) times daily. [provider] Taking Active   Misc Natural Products (GLUCOSAMINE CHOND COMPLEX/MSM) TABS [id number] Yes Take 2 capsules by mouth daily. [provider] Taking Active Self  silver sulfADIAZINE (SILVADENE) 1 % cream [id number]  Apply topically 2 (two) times daily. Kathrine Haddock, NP  Active   Turmeric 500 MG CAPS [id number] Yes Take 1,000 mg by mouth.  [provider] Taking  Active   venlafaxine XR (EFFEXOR-XR) 75 MG 24 hr capsule [id number] Yes Take 75 mg by mouth daily. Myer Haff, MD Taking Active   vitamin B-12 (CYANOCOBALAMIN) 1000 MCG tablet [id number] Yes Take 1,000 mcg by mouth daily. [provider] Taking Active             Patient Active Problem List   Diagnosis Date Noted   Chronic fatigue 09/21/2020   Severe episode of recurrent major depressive disorder, without psychotic features ([place]) 11/14/2019   Habitual self-excoriation 11/14/2019   GAD (generalized anxiety disorder) 11/14/2019   Attention deficit hyperactivity disorder (ADHD),  predominantly hyperactive type 11/14/2019   Familial hyperlipidemia 08/21/2019   Postmenopausal estrogen deficiency 04/08/2019   GERD (gastroesophageal reflux disease) 12/10/2018   OCD (obsessive compulsive disorder) 04/15/2018   Morbid obesity ([place]) 04/15/2018   Medication monitoring encounter 04/15/2018   Vitamin B12 deficiency 04/15/2018   Vitamin D deficiency 04/15/2018   Bilateral primary osteoarthritis of knee 04/15/2018   Interstitial cystitis 04/15/2018   OSA (obstructive sleep apnea) 07/14/2015   Hypertension    Hyperlipidemia 07/03/2012   Prediabetes 07/03/2012    Immunization History  Administered Date(s) Administered   Fluad Quad(high Dose 65+) 01/22/2020   Influenza, High Dose Seasonal PF 04/23/2017, 02/11/2019   Influenza, Seasonal, Injecte, Preservative Fre 01/29/2013   Influenza,inj,Quad PF,6+ Mos 01/15/2014, 12/30/2014, 01/27/2016   Influenza-Unspecified 03/30/2011, 03/20/2012, 01/29/2013   PFIZER(Purple Top)SARS-COV-2 Vaccination 11/20/2019, 12/11/2019   Pneumococcal Conjugate-13 03/24/2019   Pneumococcal Polysaccharide-23 08/01/2016   Zoster, Live 01/15/2014    Conditions to be addressed/monitored:  Hypertension, Hyperlipidemia, GERD, Depression, Anxiety, and OCD, ADHD  There are no care plans that you recently modified to display for this patient.    Medication  Assistance: {MEDASSISTANCEINFO:25044}  Compliance/Adherence/Medication fill history: Care Gaps: ***  Star-Rating Drugs: ***  Patient's preferred pharmacy is:  [id number] [address] [place]Phone: [phone]: [phone] Uses pill box? {Yes or If no, why not?:20788} Pt endorses ***% compliance  We discussed: {Pharmacy options:24294} Patient decided to: {US Pharmacy Plan:23885}  Care Plan and Follow Up Patient Decision:  {FOLLOWUP:24991}  Plan: {CM FOLLOW UP PAL:4282639 ***  Current Barriers:  {pharmacybarriers:24917}  Pharmacist Clinical Goal(s):  Patient will {PHARMACYGOALCHOICES:24921} through collaboration with PharmD and provider.   Interventions: 1:1 collaboration with TDelsa Grana PA-C regarding development and update of comprehensive plan of care as evidenced by provider attestation and co-signature Inter-disciplinary care team collaboration (see longitudinal plan of care) Comprehensive medication review performed; medication list updated in electronic medical record  Hypertension (BP goal {CHL HP UPSTREAM Pharmacist BP ranges:[phone]}) -{US controlled/uncontrolled:25276} -Current treatment: *** -Medications previously tried: ***  -Current home readings: *** -Current dietary habits: *** -Current exercise habits: *** -{ACTIONS;DENIES/REPORTS:21021675::"Denies"} hypotensive/hypertensive symptoms -Educated on {CCM BP Counseling:25124} -Counseled to monitor BP at home ***, document, and provide log at future appointments -{CCMPHARMDINTERVENTION:25122}  Depression/Anxiety (Goal: ***) -{US controlled/uncontrolled:25276} -Current treatment: Venlafaxine XR 75 mg daily  -Medications previously tried/failed: Aripiprazole, Quetiapine  -PHQ9: 12 -GAD7: 21 -Connected with *** for mental health support -Educated on {CCM mental health counseling:25127} -{CCMPHARMDINTERVENTION:25122}  Patient  Goals/Self-Care Activities Patient will:  - {pharmacypatientgoals:24919}  Follow Up Plan: {CM FOLLOW UP PRB:4445510

## 2020-12-16 ENCOUNTER — Other Ambulatory Visit: Payer: Self-pay

## 2020-12-16 ENCOUNTER — Ambulatory Visit (INDEPENDENT_AMBULATORY_CARE_PROVIDER_SITE_OTHER): Payer: Medicare HMO | Admitting: Unknown Physician Specialty

## 2020-12-16 ENCOUNTER — Encounter: Payer: Self-pay | Admitting: Unknown Physician Specialty

## 2020-12-16 VITALS — BP 122/78 | HR 82 | Temp 97.7°F | Resp 16 | Ht 65.0 in | Wt 215.4 lb

## 2020-12-16 DIAGNOSIS — M542 Cervicalgia: Secondary | ICD-10-CM | POA: Diagnosis not present

## 2020-12-16 DIAGNOSIS — F5101 Primary insomnia: Secondary | ICD-10-CM

## 2020-12-16 DIAGNOSIS — R5383 Other fatigue: Secondary | ICD-10-CM

## 2020-12-16 DIAGNOSIS — Z23 Encounter for immunization: Secondary | ICD-10-CM | POA: Diagnosis not present

## 2020-12-16 DIAGNOSIS — R5382 Chronic fatigue, unspecified: Secondary | ICD-10-CM | POA: Diagnosis not present

## 2020-12-16 DIAGNOSIS — F332 Major depressive disorder, recurrent severe without psychotic features: Secondary | ICD-10-CM

## 2020-12-16 DIAGNOSIS — I10 Essential (primary) hypertension: Secondary | ICD-10-CM

## 2020-12-16 DIAGNOSIS — R61 Generalized hyperhidrosis: Secondary | ICD-10-CM | POA: Diagnosis not present

## 2020-12-16 DIAGNOSIS — G4700 Insomnia, unspecified: Secondary | ICD-10-CM | POA: Insufficient documentation

## 2020-12-16 DIAGNOSIS — R69 Illness, unspecified: Secondary | ICD-10-CM | POA: Diagnosis not present

## 2020-12-16 DIAGNOSIS — E782 Mixed hyperlipidemia: Secondary | ICD-10-CM

## 2020-12-16 NOTE — Assessment & Plan Note (Signed)
Seeing therapy and psychiatatry.  Therapist trained in functional medicine.  I am willing to assist if needed.

## 2020-12-16 NOTE — Addendum Note (Signed)
Addended by: Docia Furl on: 12/16/2020 02:42 PM   Modules accepted: Orders

## 2020-12-16 NOTE — Patient Instructions (Signed)
Magnesium supplement - Glycinate, citrate, biguinate  L theanine Arginine  Consider light box

## 2020-12-16 NOTE — Assessment & Plan Note (Addendum)
Unwilling to start statins at this time.  May start Atorvastatin she has at home

## 2020-12-16 NOTE — Assessment & Plan Note (Signed)
TSH is normal.  Will order a times cortisol level with circadian rhythms not "being right."  Also request from there therapist.

## 2020-12-16 NOTE — Assessment & Plan Note (Signed)
Stable, continue present medications.   

## 2020-12-16 NOTE — Assessment & Plan Note (Addendum)
Not using CPAP.  She is considering.  Discussed resetting circadian rhythms.  Unable to get out in the morning for daylight exposure.  Gave supplement suggestions on AVS.  Will continue to work with therapy

## 2020-12-16 NOTE — Progress Notes (Signed)
BP 122/78   Pulse 82   Temp 97.7 F (36.5 C)   Resp 16   Ht '5\' 5"'$  (1.651 m)   Wt 215 lb 6.4 oz (97.7 kg)   SpO2 95%   BMI 35.84 kg/m    Subjective:    Patient ID: Katherine Gould, female    DOB: 1951-01-15, 70 y.o.   MRN: [id number]  HPI: Katherine Gould is a 70 y.o. female  Chief Complaint  Patient presents with   Neck Pain   Fatigue   Hyperlipidemia Pt states she has been given Atorvastatin.  Unwilling to try it due to concerns about side-effects.    Insomnia Unable to tolerate medications or supplements.  Going to therapy  Fatigue One month ago increased irritability, anxiety, depression and increased fatigue.  Had anxiety and depression all her life.  Sweats a lot.  Taking Venlafaxine.  Has tried stopping it and is unable.  Sees psychiatry  Hyperhidrosis Concerned about excessive sweating.    Neck pain Left occipital pain that lasted a week.   Relevant past medical, surgical, family and social history reviewed and updated as indicated. Interim medical history since our last visit reviewed. Allergies and medications reviewed and updated.  Review of Systems  Per HPI unless specifically indicated above     Objective:    BP 122/78   Pulse 82   Temp 97.7 F (36.5 C)   Resp 16   Ht '5\' 5"'$  (1.651 m)   Wt 215 lb 6.4 oz (97.7 kg)   SpO2 95%   BMI 35.84 kg/m   Wt Readings from Last 3 Encounters:  12/16/20 215 lb 6.4 oz (97.7 kg)  09/21/20 214 lb 14.4 oz (97.5 kg)  07/22/20 214 lb 8 oz (97.3 kg)    Physical Exam Constitutional:      General: She is not in acute distress.    Appearance: Normal appearance. She is well-developed.  HENT:     Head: Normocephalic and atraumatic.  Eyes:     General: Lids are normal. No scleral icterus.       Right eye: No discharge.        Left eye: No discharge.     Conjunctiva/sclera: Conjunctivae normal.  Neck:     Vascular: No carotid bruit or JVD.  Cardiovascular:     Rate and Rhythm: Normal rate and regular rhythm.      Heart sounds: Normal heart sounds.  Pulmonary:     Effort: Pulmonary effort is normal. No respiratory distress.     Breath sounds: Normal breath sounds.  Abdominal:     Palpations: There is no hepatomegaly or splenomegaly.  Musculoskeletal:        General: Normal range of motion.     Cervical back: Normal range of motion and neck supple.  Skin:    General: Skin is warm and dry.     Coloration: Skin is not pale.     Findings: No rash.  Neurological:     Mental Status: She is alert and oriented to person, place, and time.  Psychiatric:        Behavior: Behavior normal.        Thought Content: Thought content normal.        Judgment: Judgment normal.    Results for orders placed or performed in visit on 09/21/20  Comprehensive metabolic panel  Result Value Ref Range   Glucose, Bld 86 65 - 99 mg/dL   BUN 17 7 - 25 mg/dL  Creat 0.98 0.50 - 0.99 mg/dL   BUN/Creatinine Ratio NOT APPLICABLE 6 - 22 (calc)   Sodium 141 135 - 146 mmol/L   Potassium 4.2 3.5 - 5.3 mmol/L   Chloride 104 98 - 110 mmol/L   CO2 29 20 - 32 mmol/L   Calcium 9.6 8.6 - 10.4 mg/dL   Total Protein 7.0 6.1 - 8.1 g/dL   Albumin 4.2 3.6 - 5.1 g/dL   Globulin 2.8 1.9 - 3.7 g/dL (calc)   AG Ratio 1.5 1.0 - 2.5 (calc)   Total Bilirubin 0.5 0.2 - 1.2 mg/dL   Alkaline phosphatase (APISO) 67 37 - 153 U/L   AST 11 10 - 35 U/L   ALT 9 6 - 29 U/L  Lipid panel  Result Value Ref Range   Cholesterol 225 (H) <200 mg/dL   HDL 51 > OR = 50 mg/dL   Triglycerides 174 (H) <150 mg/dL   LDL Cholesterol (Calc) 143 (H) mg/dL (calc)   Total CHOL/HDL Ratio 4.4 <5.0 (calc)   Non-HDL Cholesterol (Calc) 174 (H) <130 mg/dL (calc)  TSH  Result Value Ref Range   TSH 1.69 0.40 - 4.50 mIU/L  CBC with Differential/Platelet  Result Value Ref Range   WBC 6.6 3.8 - 10.8 Thousand/uL   RBC 4.60 3.80 - 5.10 Million/uL   Hemoglobin 14.0 11.7 - 15.5 g/dL   HCT 42.2 35.0 - 45.0 %   MCV 91.7 80.0 - 100.0 fL   MCH 30.4 27.0 - 33.0 pg   MCHC  33.2 32.0 - 36.0 g/dL   RDW 13.3 11.0 - 15.0 %   Platelets 263 140 - 400 Thousand/uL   MPV 10.3 7.5 - 12.5 fL   Neutro Abs 3,010 1,500 - 7,800 cells/uL   Lymphs Abs 2,825 850 - 3,900 cells/uL   Absolute Monocytes 574 200 - 950 cells/uL   Eosinophils Absolute 139 15 - 500 cells/uL   Basophils Absolute 53 0 - 200 cells/uL   Neutrophils Relative % 45.6 %   Total Lymphocyte 42.8 %   Monocytes Relative 8.7 %   Eosinophils Relative 2.1 %   Basophils Relative 0.8 %      Assessment & Plan:   Problem List Items Addressed This Visit       Unprioritized   Hyperlipidemia (Chronic)    Unwilling to start statins at this time.  May start Atorvastatin she has at home      Hypertension (Chronic)    Stable, continue present medications.        Chronic fatigue    TSH is normal.  Will order a times cortisol level with circadian rhythms not "being right."  Also request from there therapist.       Hyperhidrosis    Discussed alternative anti-persperants      Insomnia    Not using CPAP.  She is considering.  Discussed resetting circadian rhythms.  Unable to get out in the morning for daylight exposure.  Gave supplement suggestions on AVS.  Will continue to work with therapy      Severe episode of recurrent major depressive disorder, without psychotic features ([place])    Seeing therapy and psychiatatry.  Therapist trained in functional medicine.  I am willing to assist if needed.        Other Visit Diagnoses     Neck pain on left side    -  Primary   This comes and goes and now OK.  Encouraged chiropractic care   Other fatigue  Relevant Orders   Salivary Cortisol X4, Timed        Follow up plan: Return in about 3 months (around 03/17/2021).

## 2020-12-16 NOTE — Assessment & Plan Note (Signed)
Discussed alternative anti-persperants

## 2020-12-23 ENCOUNTER — Ambulatory Visit: Payer: Self-pay

## 2020-12-23 NOTE — Telephone Encounter (Signed)
3 rd attempt to contact patient regarding medication administration. No answer, left message to call clinic back in am .

## 2020-12-23 NOTE — Telephone Encounter (Signed)
Pt was given an RX and went to have it done but pharmacy or lab advised her to contact her doctor to show her and instruct her on how to give herself the medication/ pt has 4 vials of Cortisol and needs to know how to use it/ she asked if a nurse can call her to explain the process / she prefers not to come in office / please advise  Called patient to review medication request. No answer, left voicemail to call clinic back

## 2020-12-23 NOTE — Telephone Encounter (Signed)
Patient called, left VM to return the call to the office to discuss symptoms with a nurse.    Message from Sharene Skeans sent at 12/23/2020  4:35 PM ED  Summary: RX advice   Pt was given an RX and went to have it done but pharmacy or lab advised her to contact her doctor to show her and instruct her on how to give herself the medication/ pt has 4 vials of Cortisol and needs to know how to use it/ she asked if a nurse can call her to explain the process / she prefers not to come in office / please advise

## 2020-12-24 NOTE — Telephone Encounter (Signed)
Patient returned call, she was given the message below from [place]. Patient says she received this from [facility] when she was filling in at this office. She says it's not medication it's a cortisol test that she's supposed to do, a saliva test. She says [facility] told her to call because the provider is supposed to give her the times to swab, then she will bring the samples to [facility]. I advised I will send this back to a provider and someone will call with the instructions, patient verbalized understanding.

## 2020-12-24 NOTE — Telephone Encounter (Signed)
Tried to call pt left voicemail, we did not rx this medication. She will need to contact the doctor/office who prescribed.

## 2020-12-31 ENCOUNTER — Encounter: Payer: Self-pay | Admitting: Unknown Physician Specialty

## 2020-12-31 NOTE — Telephone Encounter (Signed)
Spoke with patient on this morning and advised her per Kathrine Haddock, FNP, on how to collect her salvia for the cortisol test. Per Malachy Mood patient is to collect at 8:00 AM, 12:00 Noon, 4:00 PM, and before bedtime.  Patient verbalized understanding but wanted me to mail her these instructions.  I informed patient that I would sent out via mail on today.

## 2021-02-24 ENCOUNTER — Telehealth: Payer: Self-pay

## 2021-02-24 NOTE — Telephone Encounter (Signed)
Copied from [place] [id number]. Topic: General - Call Back - No Documentation >> Feb 24, 2021  4:11 PM Erick Blinks wrote: Reason for CRM: Pt has a testing kit for her saliva and has questions for the clinic please advise.   Best contact: [phone]

## 2021-02-25 DIAGNOSIS — F411 Generalized anxiety disorder: Secondary | ICD-10-CM | POA: Diagnosis not present

## 2021-02-25 DIAGNOSIS — R69 Illness, unspecified: Secondary | ICD-10-CM | POA: Diagnosis not present

## 2021-02-25 DIAGNOSIS — F332 Major depressive disorder, recurrent severe without psychotic features: Secondary | ICD-10-CM | POA: Diagnosis not present

## 2021-03-03 ENCOUNTER — Encounter: Payer: Self-pay | Admitting: Unknown Physician Specialty

## 2021-03-03 ENCOUNTER — Other Ambulatory Visit: Payer: Self-pay

## 2021-03-03 ENCOUNTER — Ambulatory Visit (INDEPENDENT_AMBULATORY_CARE_PROVIDER_SITE_OTHER): Payer: Medicare HMO | Admitting: Unknown Physician Specialty

## 2021-03-03 DIAGNOSIS — R69 Illness, unspecified: Secondary | ICD-10-CM | POA: Diagnosis not present

## 2021-03-03 DIAGNOSIS — I10 Essential (primary) hypertension: Secondary | ICD-10-CM

## 2021-03-03 DIAGNOSIS — R252 Cramp and spasm: Secondary | ICD-10-CM | POA: Diagnosis not present

## 2021-03-03 DIAGNOSIS — F5101 Primary insomnia: Secondary | ICD-10-CM | POA: Diagnosis not present

## 2021-03-03 DIAGNOSIS — E782 Mixed hyperlipidemia: Secondary | ICD-10-CM

## 2021-03-03 MED ORDER — GABAPENTIN 100 MG PO CAPS: 100.0000 mg | ORAL_CAPSULE | Freq: Every day | ORAL | 1 refills | Status: DC

## 2021-03-03 NOTE — Assessment & Plan Note (Addendum)
Refusing statins.  Consider rechecking in 3 months for f/u if she considers taking.  Not willing to consider Zetia at this time./

## 2021-03-03 NOTE — Assessment & Plan Note (Signed)
Stable, continue present medications.   

## 2021-03-03 NOTE — Progress Notes (Signed)
BP 132/80   Pulse 70   Temp (!) 97.3 F (36.3 C)   Resp 16   Ht 5\' 5"  (1.651 m)   Wt 216 lb 3.2 oz (98.1 kg)   SpO2 93%   BMI 35.98 kg/m    Subjective:    Patient ID: Katherine Gould, female    DOB: 12-06-1950, 70 y.o.   MRN: [id number]  HPI: Katherine Gould is a 70 y.o. female  Chief Complaint  Patient presents with   Hyperlipidemia   Hypertension   Depression   Depression Pt states she has not gotten a Cortisol level yet.  [facility]. She is working on Forensic psychologist.  Seeing a psychiatrist for Effexor and Clonazepam.  No change with suicidal ideation.    Depression screen [facility] 2/9 03/03/2021 12/16/2020 09/21/2020 07/22/2020 01/22/2020  Decreased Interest 3 3 3 3 3   Down, Depressed, Hopeless 3 3 3 3 3   PHQ - 2 Score 6 6 6 6 6   Altered sleeping 3 3 3 3 3   Tired, decreased energy 3 3 3 3 3   Change in appetite 3 3 0 3 3  Feeling bad or failure about yourself  3 3 0 3 3  Trouble concentrating 3 3 0 3 3  Moving slowly or fidgety/restless 3 3 0 3 3  Suicidal thoughts 2 1 0 2 3  PHQ-9 Score 26 25 12 26 27   Difficult doing work/chores Very difficult Very difficult Somewhat difficult Very difficult -  Some recent data might be hidden   Hyperlipidemia: Refusing statin at this time.  Stopped sugar which included fats such as ice cream for the last week  Shin pain This is a problem and keeping her up at night.  Worries she has restless legs.  States her muscles really tighten up.  Plans to start Magnesium at night.     Relevant past medical, surgical, family and social history reviewed and updated as indicated. Interim medical history since our last visit reviewed. Allergies and medications reviewed and updated.  Review of Systems  Per HPI unless specifically indicated above     Objective:    BP 132/80   Pulse 70   Temp (!) 97.3 F (36.3 C)   Resp 16   Ht 5\' 5"  (1.651 m)   Wt 216 lb 3.2 oz (98.1 kg)   SpO2 93%   BMI 35.98 kg/m   Wt  Readings from Last 3 Encounters:  03/03/21 216 lb 3.2 oz (98.1 kg)  12/16/20 215 lb 6.4 oz (97.7 kg)  09/21/20 214 lb 14.4 oz (97.5 kg)    Physical Exam Constitutional:      General: She is not in acute distress.    Appearance: Normal appearance. She is well-developed.  HENT:     Head: Normocephalic and atraumatic.  Eyes:     General: Lids are normal. No scleral icterus.       Right eye: No discharge.        Left eye: No discharge.     Conjunctiva/sclera: Conjunctivae normal.  Neck:     Vascular: No carotid bruit or JVD.  Cardiovascular:     Rate and Rhythm: Normal rate and regular rhythm.     Heart sounds: Normal heart sounds.  Pulmonary:     Effort: Pulmonary effort is normal. No respiratory distress.     Breath sounds: Normal breath sounds.  Abdominal:     Palpations: There is no hepatomegaly or splenomegaly.  Musculoskeletal:  General: Normal range of motion.     Cervical back: Normal range of motion and neck supple.  Skin:    General: Skin is warm and dry.     Coloration: Skin is not pale.     Findings: No rash.  Neurological:     Mental Status: She is alert and oriented to person, place, and time.  Psychiatric:        Behavior: Behavior normal.        Thought Content: Thought content normal.        Judgment: Judgment normal.    Results for orders placed or performed in visit on 09/21/20  Comprehensive metabolic panel  Result Value Ref Range   Glucose, Bld 86 65 - 99 mg/dL   BUN 17 7 - 25 mg/dL   Creat 0.98 0.50 - 0.99 mg/dL   BUN/Creatinine Ratio NOT APPLICABLE 6 - 22 (calc)   Sodium 141 135 - 146 mmol/L   Potassium 4.2 3.5 - 5.3 mmol/L   Chloride 104 98 - 110 mmol/L   CO2 29 20 - 32 mmol/L   Calcium 9.6 8.6 - 10.4 mg/dL   Total Protein 7.0 6.1 - 8.1 g/dL   Albumin 4.2 3.6 - 5.1 g/dL   Globulin 2.8 1.9 - 3.7 g/dL (calc)   AG Ratio 1.5 1.0 - 2.5 (calc)   Total Bilirubin 0.5 0.2 - 1.2 mg/dL   Alkaline phosphatase (APISO) 67 37 - 153 U/L   AST 11 10  - 35 U/L   ALT 9 6 - 29 U/L  Lipid panel  Result Value Ref Range   Cholesterol 225 (H) <200 mg/dL   HDL 51 > OR = 50 mg/dL   Triglycerides 174 (H) <150 mg/dL   LDL Cholesterol (Calc) 143 (H) mg/dL (calc)   Total CHOL/HDL Ratio 4.4 <5.0 (calc)   Non-HDL Cholesterol (Calc) 174 (H) <130 mg/dL (calc)  TSH  Result Value Ref Range   TSH 1.69 0.40 - 4.50 mIU/L  CBC with Differential/Platelet  Result Value Ref Range   WBC 6.6 3.8 - 10.8 Thousand/uL   RBC 4.60 3.80 - 5.10 Million/uL   Hemoglobin 14.0 11.7 - 15.5 g/dL   HCT 42.2 35.0 - 45.0 %   MCV 91.7 80.0 - 100.0 fL   MCH 30.4 27.0 - 33.0 pg   MCHC 33.2 32.0 - 36.0 g/dL   RDW 13.3 11.0 - 15.0 %   Platelets 263 140 - 400 Thousand/uL   MPV 10.3 7.5 - 12.5 fL   Neutro Abs 3,010 1,500 - 7,800 cells/uL   Lymphs Abs 2,825 850 - 3,900 cells/uL   Absolute Monocytes 574 200 - 950 cells/uL   Eosinophils Absolute 139 15 - 500 cells/uL   Basophils Absolute 53 0 - 200 cells/uL   Neutrophils Relative % 45.6 %   Total Lymphocyte 42.8 %   Monocytes Relative 8.7 %   Eosinophils Relative 2.1 %   Basophils Relative 0.8 %      Assessment & Plan:   Problem List Items Addressed This Visit       Unprioritized   Hyperlipidemia (Chronic)    Refusing statins.  Consider rechecking in 3 months for f/u if she considers taking.  Not willing to consider Zetia at this time./        Hypertension (Chronic)    Stable, continue present medications.        Insomnia    Magnesium QHS, low dose of melatonin, consider Gabapentin if Magnesium is ineffecte  Leg cramps    Discussed yellow mustard and Magnesium QHS.  Will rx low dose Gabapentin QHS        Follow up plan: Return in about 3 months (around 06/03/2021).

## 2021-03-03 NOTE — Assessment & Plan Note (Signed)
Discussed yellow mustard and Magnesium QHS.  Will rx low dose Gabapentin QHS

## 2021-03-03 NOTE — Assessment & Plan Note (Signed)
Magnesium QHS, low dose of melatonin, consider Gabapentin if Magnesium is ineffecte

## 2021-03-04 ENCOUNTER — Telehealth: Payer: Self-pay

## 2021-03-04 NOTE — Telephone Encounter (Signed)
Copied from [place] [id number]. Topic: General - Other >> Mar 04, 2021  4:10 PM Tessa Lerner A wrote: Reason for CRM: The patient would like to be contacted regarding their most recent prescription for gabapentin (NEURONTIN) 100 MG capsule [[id number]  The medication was submitted to a pharmacy that the patient has no knowledge of  The patient has stressed the urgency of a response   The patient hasn't used the following pharmacy before and has no plans to use it in the future   [facility] [place] [facility] [facility]  [address], [place] [address]  Phone:  [phone] Fax:  [phone]  The patient would like to know why this pharmacy was used without their permission and approval   Please contact further

## 2021-03-07 ENCOUNTER — Other Ambulatory Visit: Payer: Self-pay

## 2021-03-07 MED ORDER — GABAPENTIN 100 MG PO CAPS: 100.0000 mg | ORAL_CAPSULE | Freq: Every day | ORAL | 1 refills | Status: DC

## 2021-03-07 NOTE — Telephone Encounter (Signed)
Called pt no answer, left vm to call back to the office.

## 2021-03-07 NOTE — Telephone Encounter (Signed)
Pt was notified [facility] [place]. I told the pt she still has the mail service pharmacy added to her prefer list may have been a mistake provider used the wrong pharmacy.

## 2021-03-09 ENCOUNTER — Other Ambulatory Visit: Payer: Self-pay | Admitting: Unknown Physician Specialty

## 2021-03-09 MED ORDER — GABAPENTIN 100 MG PO CAPS: 100.0000 mg | ORAL_CAPSULE | Freq: Every day | ORAL | 1 refills | Status: DC

## 2021-03-15 DIAGNOSIS — M1712 Unilateral primary osteoarthritis, left knee: Secondary | ICD-10-CM | POA: Diagnosis not present

## 2021-03-15 DIAGNOSIS — M21062 Valgus deformity, not elsewhere classified, left knee: Secondary | ICD-10-CM | POA: Diagnosis not present

## 2021-04-14 ENCOUNTER — Telehealth: Payer: Self-pay | Admitting: Family Medicine

## 2021-04-14 NOTE — Telephone Encounter (Signed)
Called pt and lvm to inform pt that dr Ancil Boozer cannont take on anymore pts and that I moved her appt over to Dr Rosana Berger for same date and time

## 2021-04-14 NOTE — Telephone Encounter (Signed)
Patient says would like to see Dr Katherine Gould instead of Dr Lucio Edward for her appt in February and as her primary Dr. She says she doesn't mesh well with Dr Lucio Edward. Please call back

## 2021-05-27 DIAGNOSIS — F332 Major depressive disorder, recurrent severe without psychotic features: Secondary | ICD-10-CM | POA: Diagnosis not present

## 2021-05-27 DIAGNOSIS — F411 Generalized anxiety disorder: Secondary | ICD-10-CM | POA: Diagnosis not present

## 2021-06-03 ENCOUNTER — Ambulatory Visit: Payer: Medicare HMO | Admitting: Internal Medicine

## 2021-07-05 ENCOUNTER — Other Ambulatory Visit: Payer: Self-pay | Admitting: Internal Medicine

## 2021-07-05 NOTE — Telephone Encounter (Signed)
Medication Refill - Medication: silver sulfADIAZINE (SILVADENE) 1 % cream  ? ?Has the patient contacted their pharmacy? Yes.   ?(Agent: If no, request that the patient contact the pharmacy for the refill. If patient does not wish to contact the pharmacy document the reason why and proceed with request.) ?(Agent: If yes, when and what did the pharmacy advise?) ? ?Preferred Pharmacy (with phone number or street name):  ?[facility] [place], [place] AT [facility] OF [address]  ?[place] [address]  ?Phone: [phone] Fax: [phone]  ? ?Has the patient been seen for an appointment in the last year OR does the patient have an upcoming appointment? Yes.   ? ?Agent: Please be advised that RX refills may take up to 3 business days. We ask that you follow-up with your pharmacy. ? ?

## 2021-07-07 NOTE — Telephone Encounter (Signed)
Requested medication (s) are due for refill today: yes ? ?Requested medication (s) are on the active medication list: yes ? ?Last refill:  09/22/20 50g with 0 RF ? ?Future visit scheduled: no, canceled 06/03/21 with Dr Rosana Berger ? ?Notes to clinic:  This med does not have a protocol to follow, please assess. ? ? ? ?  ? ?Requested Prescriptions  ?Pending Prescriptions Disp Refills  ? silver sulfADIAZINE (SILVADENE) 1 % cream 50 g 0  ?  Sig: Apply topically 2 (two) times daily.  ?  ? Off-Protocol Failed - 07/05/2021  4:59 PM  ?  ?  Failed - Medication not assigned to a protocol, review manually.  ?  ?  Passed - Valid encounter within last 12 months  ?  Recent Outpatient Visits   ? ?      ? 4 months ago Leg cramps  ? [place], NP  ? 6 months ago Neck pain on left side  ? [facility] [place], Malachy Mood, NP  ? 9 months ago Ulcers of both lower legs ([place])  ? [place], NP  ? 11 months ago Dysuria  ? [facility] Delsa Grana, PA-C  ? 1 year ago Essential hypertension  ? [facility] Delsa Grana, [place]  ? ?  ?  ? ?  ?  ?  ? ? ?

## 2021-07-11 ENCOUNTER — Ambulatory Visit: Payer: Self-pay

## 2021-07-11 NOTE — Telephone Encounter (Signed)
?  Chief Complaint: Patient would like this medication refilled. ?Symptoms: Pt picks skin ?Frequency: 30 + years. ?Pertinent Negatives: Patient denies  ?Disposition: '[]'$ ED /'[]'$ Urgent Care (no appt availability in office) / '[]'$ Appointment(In office/virtual)/ '[]'$  [place] [facility]/ '[]'$ Home Care/ '[]'$ Refused Recommended Disposition /'[]'$ [place] [facility]/ '[x]'$  Follow-up with PCP ?Additional Notes: Pt states that she has been using this medication for 30 years and would like to have this refilled. Pt is not happy that she would need to have an OV for this refill. Pt has depression and anxiety which makes it difficult for her come in. Pt is considering moving to a new practice.  ? ? ?Summary: questions why an appt is needed for refill  ? Pt requests call back from a nurse to explain why an appt is needed to refill silver sulfADIAZINE (SILVADENE) 1 % cream.   ?  ? ?Reason for Disposition ? [1] Prescription refill request for NON-ESSENTIAL medicine (i.e., no harm to patient if med not taken) AND [2] triager unable to refill per department policy ? ?Answer Assessment - Initial Assessment Questions ?1. DRUG NAME: "What medicine do you need to have refilled?" ?    Silvadene ?2. REFILLS REMAINING: "How many refills are remaining?" (Note: The label on the medicine or pill bottle will show how many refills are remaining. If there are no refills remaining, then a renewal may be needed.) ?    0 ?3. EXPIRATION DATE: "What is the expiration date?" (Note: The label states when the prescription will expire, and thus can no longer be refilled.) ?    na ?4. PRESCRIBING HCP: "Who prescribed it?" Reason: If prescribed by specialist, call should be referred to that group. ?    na ?5. SYMPTOMS: "Do you have any symptoms?" ?    Skin infections ?6. PREGNANCY: "Is there any chance that you are pregnant?" "When was your lnaast menstrual period?" ?    na ? ?Protocols used: Medication Refill and Renewal Call-A-AH ? ?

## 2021-07-12 NOTE — Addendum Note (Signed)
Addended by: Docia Furl on: 07/12/2021 08:06 AM ? ? Modules accepted: Orders ? ?

## 2021-07-14 DIAGNOSIS — M1712 Unilateral primary osteoarthritis, left knee: Secondary | ICD-10-CM | POA: Diagnosis not present

## 2021-07-25 ENCOUNTER — Ambulatory Visit: Payer: Medicare Other | Admitting: Internal Medicine

## 2021-07-25 ENCOUNTER — Ambulatory Visit: Payer: Self-pay

## 2021-07-25 NOTE — Telephone Encounter (Signed)
? ?  Chief Complaint: Blood when wiping ?Symptoms: Above ?Frequency: Started Friday ?Pertinent Negatives: Patient denies fever ?Disposition: '[]'$ ED /'[]'$ Urgent Care (no appt availability in office) / '[x]'$ Appointment(In office/virtual)/ '[]'$  [place] [facility]/ '[]'$ Home Care/ '[]'$ Refused Recommended Disposition /'[]'$ [place] [facility]/ '[]'$  Follow-up with PCP ?Additional Notes:   ?Reason for Disposition ? Pain or burning with passing urine ? ?Answer Assessment - Initial Assessment Questions ?1. COLOR of URINE: "Describe the color of the urine."  (e.g., tea-colored, pink, red, blood clots, bloody) ?    Blood when wipes ?2. ONSET: "When did the bleeding start?"  ?    Friday ?3. EPISODES: "How many times has there been blood in the urine?" or "How many times today?" ?    Not every time ?4. PAIN with URINATION: "Is there any pain with passing your urine?" If Yes, ask: "How bad is the pain?"  (Scale 1-10; or mild, moderate, severe) ?   - MILD - complains slightly about urination hurting ?   - MODERATE - interferes with normal activities   ?   - SEVERE - excruciating, unwilling or unable to urinate because of the pain  ?    No ?5. FEVER: "Do you have a fever?" If Yes, ask: "What is your temperature, how was it measured, and when did it start?" ?    No ?6. ASSOCIATED SYMPTOMS: "Are you passing urine more frequently than usual?" ?    No ?7. OTHER SYMPTOMS: "Do you have any other symptoms?" (e.g., back/flank pain, abdominal pain, vomiting) ?    No ?8. PREGNANCY: "Is there any chance you are pregnant?" "When was your last menstrual period?" ?    No ? ?Protocols used: Urine - Blood In-A-AH ? ?

## 2021-07-26 ENCOUNTER — Ambulatory Visit (INDEPENDENT_AMBULATORY_CARE_PROVIDER_SITE_OTHER): Payer: Medicare Other | Admitting: Internal Medicine

## 2021-07-26 ENCOUNTER — Encounter: Payer: Self-pay | Admitting: Internal Medicine

## 2021-07-26 VITALS — BP 126/78 | HR 96 | Temp 98.0°F | Resp 16 | Ht 65.0 in | Wt 217.4 lb

## 2021-07-26 DIAGNOSIS — F424 Excoriation (skin-picking) disorder: Secondary | ICD-10-CM

## 2021-07-26 DIAGNOSIS — M545 Low back pain, unspecified: Secondary | ICD-10-CM

## 2021-07-26 DIAGNOSIS — N3001 Acute cystitis with hematuria: Secondary | ICD-10-CM

## 2021-07-26 DIAGNOSIS — Z13820 Encounter for screening for osteoporosis: Secondary | ICD-10-CM

## 2021-07-26 DIAGNOSIS — K219 Gastro-esophageal reflux disease without esophagitis: Secondary | ICD-10-CM

## 2021-07-26 DIAGNOSIS — E7849 Other hyperlipidemia: Secondary | ICD-10-CM | POA: Diagnosis not present

## 2021-07-26 DIAGNOSIS — E2839 Other primary ovarian failure: Secondary | ICD-10-CM

## 2021-07-26 DIAGNOSIS — Z1231 Encounter for screening mammogram for malignant neoplasm of breast: Secondary | ICD-10-CM

## 2021-07-26 DIAGNOSIS — F411 Generalized anxiety disorder: Secondary | ICD-10-CM

## 2021-07-26 DIAGNOSIS — F332 Major depressive disorder, recurrent severe without psychotic features: Secondary | ICD-10-CM

## 2021-07-26 DIAGNOSIS — I10 Essential (primary) hypertension: Secondary | ICD-10-CM

## 2021-07-26 LAB — POCT URINALYSIS DIPSTICK
Bilirubin, UA: NEGATIVE
Glucose, UA: NEGATIVE
Ketones, UA: NEGATIVE
Nitrite, UA: NEGATIVE
Protein, UA: NEGATIVE
Spec Grav, UA: 1.015 (ref 1.010–1.025)
Urobilinogen, UA: 0.2 E.U./dL
pH, UA: 6 (ref 5.0–8.0)

## 2021-07-26 MED ORDER — SILVER SULFADIAZINE 1 % EX CREA: TOPICAL_CREAM | Freq: Two times a day (BID) | CUTANEOUS | 0 refills | Status: DC

## 2021-07-26 MED ORDER — SULFAMETHOXAZOLE-TRIMETHOPRIM 800-160 MG PO TABS: 1.0000 | ORAL_TABLET | Freq: Two times a day (BID) | ORAL | 0 refills | Status: AC

## 2021-07-26 NOTE — Assessment & Plan Note (Signed)
Counseled patient on commodities, she is working on diet.  ?

## 2021-07-26 NOTE — Assessment & Plan Note (Signed)
Has a therapist she does very well with, currently on Effexor 75 mg and Klonopin as needed.  ?

## 2021-07-26 NOTE — Assessment & Plan Note (Signed)
Following with therapist and Psychiatry, currently on Effexor 75 and Klonopin but working to taper and possibly switch medications.  ?

## 2021-07-26 NOTE — Assessment & Plan Note (Signed)
Stable on Pepcid PRN, discussed avoiding trigger foods and other lifestyle measures to avoid symptoms  ?

## 2021-07-26 NOTE — Progress Notes (Signed)
? ?Established Patient Office Visit ? ?Subjective:  ?Patient ID: Katherine Gould, female    DOB: 05-08-1950  Age: 71 y.o. MRN: [id number] ? ?CC:  ?Chief Complaint  ?Patient presents with  ? Follow-up  ? Urinary Tract Infection  ?  Pt having back pain, and wiped the other day and saw blood  ? Medication Refill  ? ? ?HPI ?Katherine Gould presents for UTI symptoms and follow up. ? ?URINARY SYMPTOMS ?Dysuria: no ?Urinary frequency: no ?Urgency: no ?Small volume voids: no ?Hematuria: yes ?Abdominal pain: no ?Suprapubic pain/pressure: yes ?Fever:  no ? ?Hypertension/OSA: ?-Medications: Bisoprolol-HCTZ 10-6.25 ?-Patient is compliant with above medications and reports no side effects. ?-Checking BP at home (average): doesn't check ?-Denies any SOB, CP, vision changes, LE edema or symptoms of hypotension ? ?HLD: ?-Medications: Nothing currently, given statins in the past but doesn't want to take it yet ?-Last lipid panel: Lipid Panel  ?   ?Component Value Date/Time  ? CHOL 225 (H) 09/21/2020 1451  ? TRIG 174 (H) 09/21/2020 1451  ? HDL 51 09/21/2020 1451  ? CHOLHDL 4.4 09/21/2020 1451  ? LDLCALC 143 (H) 09/21/2020 1451  ? ?The 10-year ASCVD risk score (Arnett DK, et al., 2019) is: 12.8% ?  Values used to calculate the score: ?    Age: 67 years ?    Sex: Female ?    Is Non-Hispanic African American: No ?    Diabetic: No ?    Tobacco smoker: No ?    Systolic Blood Pressure: 809 mmHg ?    Is BP treated: Yes ?    HDL Cholesterol: 51 mg/dL ?    Total Cholesterol: 225 mg/dL ? ?MDD: ?-Mood status: controlled ?-Current treatment: Effexor 75, Klonopin as needed - following with [facility], working on tapering down ?-Satisfied with current treatment?: yes ?-Symptom severity: severe  ?-Duration of current treatment : chronic ?-Side effects: no ?Medication compliance: excellent compliance ?Psychotherapy/counseling: yes current ? ?CKD: ?-Last creatinine 0.98 6/22 ? ?GERD: ?-Currently on Pepcid 20 as needed  ?-Symptoms include gas, bloating  and abdominal discomfort  ? ?Health Maintenance: ?-Blood work 6/22 ?-Mammogram/DEXA due ? ?Past Medical History:  ?Diagnosis Date  ? Cervical polyp   ? Chronic kidney disease   ? CKD (chronic kidney disease)   ? Dysthymic disorder   ? Esophageal reflux   ? Hyperlipidemia   ? Hypertension   ? Interstitial cystitis   ? Obsessive-compulsive disorders   ? Other abnormal glucose   ? Other abnormal glucose   ? Other B-complex deficiencies   ? ? ?Past Surgical History:  ?Procedure Laterality Date  ? LASER ABLATION CONDYLOMA CERVICAL / VULVAR    ? ? ?Family History  ?Problem Relation Age of Onset  ? Heart failure Mother   ? Aortic stenosis Mother   ? Hypertension Mother   ? Stroke Mother   ? Glaucoma Mother   ? Heart attack Father   ? Hypertension Father   ? Heart attack Paternal Grandfather   ? Glaucoma Sister   ? Hypertension Sister   ? Atrial fibrillation Sister   ? Heart failure Sister   ? Anxiety disorder Sister   ? ? ?Social History  ? ?Socioeconomic History  ? Marital status: Divorced  ?  Spouse name: Not on file  ? Number of children: 1  ? Years of education: 45  ? Highest education level: High school graduate  ?Occupational History  ? Occupation: disability  ?Tobacco Use  ? Smoking status: Never  ? Smokeless  tobacco: Never  ?Vaping Use  ? Vaping Use: Never used  ?Substance and Sexual Activity  ? Alcohol use: No  ? Drug use: No  ? Sexual activity: Not Currently  ?Other Topics Concern  ? Not on file  ?Social History Narrative  ? Pt lives alone.   ? ?Social Determinants of Health  ? ?Financial Resource Strain: Not on file  ?Food Insecurity: Not on file  ?Transportation Needs: Not on file  ?Physical Activity: Not on file  ?Stress: Not on file  ?Social Connections: Not on file  ?Intimate Partner Violence: Not on file  ? ? ?Outpatient Medications Prior to Visit  ?Medication Sig Dispense Refill  ? bisoprolol-hydrochlorothiazide (ZIAC) 10-6.25 MG tablet TAKE 1 TABLET BY MOUTH EVERY DAY 90 tablet 3  ? Cholecalciferol  (VITAMIN D3) 2000 UNITS capsule Take 2,000 Units by mouth daily. Pt taking 4000 IU daily    ? clonazePAM (KLONOPIN) 1 MG tablet Take 1 mg by mouth 2 (two) times daily.    ? famotidine (PEPCID) 20 MG tablet Take 20 mg by mouth 2 (two) times daily.    ? gabapentin (NEURONTIN) 100 MG capsule Take 1 capsule (100 mg total) by mouth at bedtime. 90 capsule 1  ? Misc Natural Products (GLUCOSAMINE CHOND COMPLEX/MSM) TABS Take 2 capsules by mouth daily.    ? silver sulfADIAZINE (SILVADENE) 1 % cream Apply topically 2 (two) times daily. 50 g 0  ? Turmeric 500 MG CAPS Take 1,000 mg by mouth.     ? venlafaxine XR (EFFEXOR-XR) 75 MG 24 hr capsule Take 75 mg by mouth daily.    ? vitamin B-12 (CYANOCOBALAMIN) 1000 MCG tablet Take 1,000 mcg by mouth daily.    ? ?No facility-administered medications prior to visit.  ? ? ?Allergies  ?Allergen Reactions  ? Aripiprazole Other (See Comments)  ?  Incubus Dream ?tremmors ?  ? Quetiapine Other (See Comments)  ?  Incubus dream  ? ? ?ROS ?Review of Systems  ?Constitutional:  Negative for chills and fever.  ?Eyes:  Negative for visual disturbance.  ?Respiratory:  Negative for cough and shortness of breath.   ?Cardiovascular:  Negative for chest pain.  ?Gastrointestinal:  Negative for abdominal pain.  ?Genitourinary:  Positive for hematuria. Negative for dysuria, frequency and urgency.  ?Skin:  Positive for color change.  ?Neurological:  Negative for dizziness and headaches.  ? ?  ?Objective:  ?  ?Physical Exam ?Constitutional:   ?   Appearance: Normal appearance.  ?HENT:  ?   Head: Normocephalic and atraumatic.  ?Eyes:  ?   Conjunctiva/sclera: Conjunctivae normal.  ?Cardiovascular:  ?   Rate and Rhythm: Normal rate and regular rhythm.  ?Pulmonary:  ?   Effort: Pulmonary effort is normal.  ?   Breath sounds: Normal breath sounds.  ?Musculoskeletal:  ?   Right lower leg: No edema.  ?   Left lower leg: No edema.  ?Skin: ?   General: Skin is warm and dry.  ?   Comments: Scars on legs from  self-picking, 2 acute lesions with mild erythema, no drainage or signs of infection   ?Neurological:  ?   General: No focal deficit present.  ?   Mental Status: She is alert. Mental status is at baseline.  ?Psychiatric:     ?   Mood and Affect: Mood normal.     ?   Behavior: Behavior normal.  ? ? ?BP 126/78   Pulse 96   Temp 98 ?F (36.7 ?C)   Resp 16  Ht '5\' 5"'$  (1.651 m)   Wt 217 lb 6.4 oz (98.6 kg)   SpO2 99%   BMI 36.18 kg/m?  ?Wt Readings from Last 3 Encounters:  ?03/03/21 216 lb 3.2 oz (98.1 kg)  ?12/16/20 215 lb 6.4 oz (97.7 kg)  ?09/21/20 214 lb 14.4 oz (97.5 kg)  ? ? ? ?Health Maintenance Due  ?Topic Date Due  ? DEXA SCAN  Never done  ? MAMMOGRAM  Never done  ? Zoster Vaccines- Shingrix (1 of 2) Never done  ? COVID-19 Vaccine (4 - Booster for Pfizer series) 07/08/2020  ? ? ?There are no preventive care reminders to display for this patient. ? ?Lab Results  ?Component Value Date  ? TSH 1.69 09/21/2020  ? ?Lab Results  ?Component Value Date  ? WBC 6.6 09/21/2020  ? HGB 14.0 09/21/2020  ? HCT 42.2 09/21/2020  ? MCV 91.7 09/21/2020  ? PLT 263 09/21/2020  ? ?Lab Results  ?Component Value Date  ? NA 141 09/21/2020  ? K 4.2 09/21/2020  ? CO2 29 09/21/2020  ? GLUCOSE 86 09/21/2020  ? BUN 17 09/21/2020  ? CREATININE 0.98 09/21/2020  ? BILITOT 0.5 09/21/2020  ? AST 11 09/21/2020  ? ALT 9 09/21/2020  ? PROT 7.0 09/21/2020  ? CALCIUM 9.6 09/21/2020  ? ?Lab Results  ?Component Value Date  ? CHOL 225 (H) 09/21/2020  ? ?Lab Results  ?Component Value Date  ? HDL 51 09/21/2020  ? ?Lab Results  ?Component Value Date  ? LDLCALC 143 (H) 09/21/2020  ? ?Lab Results  ?Component Value Date  ? TRIG 174 (H) 09/21/2020  ? ?Lab Results  ?Component Value Date  ? CHOLHDL 4.4 09/21/2020  ? ?Lab Results  ?Component Value Date  ? HGBA1C 5.8 (H) 07/31/2019  ? ? ?  ?Assessment & Plan:  ? ?Problem List Items Addressed This Visit   ? ?  ? Cardiovascular and Mediastinum  ? Hypertension (Chronic)  ?  Stable, continue Bisoprolol-HCTZ 10-6.25  daily.  ?  ?  ?  ? Digestive  ? GERD (gastroesophageal reflux disease)  ?  Stable on Pepcid PRN, discussed avoiding trigger foods and other lifestyle measures to avoid symptoms  ?  ?  ?  ? Musculoskeletal and Integument

## 2021-07-26 NOTE — Assessment & Plan Note (Signed)
Stable, continue Bisoprolol-HCTZ 10-6.25 daily.  ?

## 2021-07-26 NOTE — Patient Instructions (Signed)
It was great seeing you today! ? ?Plan discussed at today's visit: ?-Antibiotic to take for UTI, take twice a day for 3 days ?-Cream refilled ?-Mammogram and bone scan ordered today as well, please call the number on the card to schedule ? ?Follow up in: 3 months for blood work, please be fasting for 8-12 hours ? ?Take care and let us know if you have any questions or concerns prior to your next visit. ? ?Dr. Rosana Berger ? ?

## 2021-07-26 NOTE — Assessment & Plan Note (Signed)
Secondary to anxiety/OCD. Following with therapy and on medication but multiple scars and lesions from picking on bilateral lower extremities. Cream sent to pharmacy, keep lesions clean and covered until completely healed.  ?

## 2021-07-26 NOTE — Assessment & Plan Note (Signed)
Discussed lipid panel with the patient and ASCVD risk of 12.8%. Recommend statin therapy however the patient is nervous about side effects. Working on diet, plan to recheck at follow up in 3 months. ?

## 2021-07-27 ENCOUNTER — Ambulatory Visit: Payer: Self-pay

## 2021-07-27 DIAGNOSIS — N3001 Acute cystitis with hematuria: Secondary | ICD-10-CM

## 2021-07-27 LAB — URINE CULTURE
MICRO NUMBER:: 13248590
Result:: NO GROWTH
SPECIMEN QUALITY:: ADEQUATE

## 2021-07-27 MED ORDER — AMOXICILLIN-POT CLAVULANATE 500-125 MG PO TABS: 1.0000 | ORAL_TABLET | Freq: Two times a day (BID) | ORAL | 0 refills | Status: AC

## 2021-07-27 NOTE — Telephone Encounter (Signed)
? ? ? ?  Chief Complaint: 48mnutes after taking Bactrim yesterday, pt. Had itching "all over.I took a Benadryl and that stopped it." ?Symptoms: Itching ?Frequency: Yesterday ?Pertinent Negatives: Patient denies no rash ?Disposition: '[]'$ ED /'[]'$ Urgent Care (no appt availability in office) / '[]'$ Appointment(In office/virtual)/ '[]'$  [place] [place] [facility]/ '[]'$ Home Care/ '[]'$ Refused Recommended Disposition /'[]'$ [facility] [facility]/ '[x]'$  Follow-up with PCP ?Additional Notes: Pt. Asking if she should take a different antibiotic. Please advise.  ?Answer Assessment - Initial Assessment Questions ?1. NAME of MEDICATION: "What medicine are you calling about?" ?    Bactrim ?2. QUESTION: "What is your question?" (e.g., double dose of medicine, side effect) ?    Side effect - itchy all over ?3. PRESCRIBING HCP: "Who prescribed it?" Reason: if prescribed by specialist, call should be referred to that group. ?    Dr. ARosana Berger?4. SYMPTOMS: "Do you have any symptoms?" ?    Moderate ?5. SEVERITY: If symptoms are present, ask "Are they mild, moderate or severe?" ?    Moderate ?6. PREGNANCY:  "Is there any chance that you are pregnant?" "When was your last menstrual period?" ?    No ? ?Protocols used: Medication Question Call-A-AH ? ?

## 2021-07-27 NOTE — Telephone Encounter (Signed)
Patient called, left voicemail with advice.  ?

## 2021-07-28 ENCOUNTER — Ambulatory Visit: Payer: Medicare Other | Admitting: Internal Medicine

## 2021-07-28 ENCOUNTER — Telehealth: Payer: Self-pay | Admitting: Internal Medicine

## 2021-07-28 NOTE — Telephone Encounter (Signed)
Can you re submit rx it did not fax in ?

## 2021-07-28 NOTE — Telephone Encounter (Signed)
Pt is calling she did receive Katherine Gould's message yesterday that another medication would be called in. However, the pharmacy has no record of receiving the bactrim. Please advise. Can this medication please be resent? ?

## 2021-08-01 ENCOUNTER — Telehealth: Payer: Self-pay

## 2021-08-01 NOTE — Progress Notes (Signed)
? ? ?  Chronic Care Management ?Pharmacy Assistant  ? ?Name: Katherine Gould  MRN: [id number] DOB: Sep 24, 1950 ? ?Schedule for Initial Visit with CPP ? ?The patient has not been seen by Junius Argyle, CPP. She was only seen by [facility] for an initial on 12/2019, and spoken to [place] once. Patient was scheduled for an Initial Visit with Cristie Hem, but no showed the appointment. ? ?Patient needs an initial appointment with Cristie Hem in order to be apart of the CCM Program. I have left a HIPAA compliant VM requesting the patient to return my call if she would like to schedule and be apart of the program. ? ?04/19 Left HIPAA compliant message requesting the patient return my call. ? ?04/20  ? ?I spoke with the patient and at this time patient has requested to unenroll from the CCM program. Per patient she is doing well and feels as if she does not need our services at this time. Patient was advised if something changes in the future she can have her PCP re-refer her back. Patient is agreeable. ? ?CPP notified. ? ?Medications: ?Outpatient Encounter Medications as of 08/01/2021  ?Medication Sig  ? amoxicillin-clavulanate (AUGMENTIN) 500-125 MG tablet Take 1 tablet (500 mg total) by mouth in the morning and at bedtime for 5 days.  ? bisoprolol-hydrochlorothiazide (ZIAC) 10-6.25 MG tablet TAKE 1 TABLET BY MOUTH EVERY DAY  ? Cholecalciferol (VITAMIN D3) 2000 UNITS capsule Take 2,000 Units by mouth daily. Pt taking 4000 IU daily  ? clonazePAM (KLONOPIN) 1 MG tablet Take 1 mg by mouth 2 (two) times daily.  ? famotidine (PEPCID) 20 MG tablet Take 20 mg by mouth 2 (two) times daily.  ? gabapentin (NEURONTIN) 100 MG capsule Take 1 capsule (100 mg total) by mouth at bedtime.  ? Magnesium 200 MG TABS Take 1 tablet by mouth daily.  ? Misc Natural Products (GLUCOSAMINE CHOND COMPLEX/MSM) TABS Take 2 capsules by mouth daily.  ? silver sulfADIAZINE (SILVADENE) 1 % cream Apply topically 2 (two) times daily.  ? Turmeric 500 MG CAPS Take 1,000 mg by  mouth.   ? venlafaxine XR (EFFEXOR-XR) 75 MG 24 hr capsule Take 75 mg by mouth daily.  ? vitamin B-12 (CYANOCOBALAMIN) 1000 MCG tablet Take 1,000 mcg by mouth daily.  ? ?No facility-administered encounter medications on file as of 08/01/2021.  ? ? ?Lynann Bologna, CPA/CMA ?Clinical Pharmacist Assistant ?Phone: [phone]  ? ?

## 2021-09-06 DIAGNOSIS — F411 Generalized anxiety disorder: Secondary | ICD-10-CM | POA: Diagnosis not present

## 2021-09-06 DIAGNOSIS — F332 Major depressive disorder, recurrent severe without psychotic features: Secondary | ICD-10-CM | POA: Diagnosis not present

## 2021-10-14 DIAGNOSIS — M25561 Pain in right knee: Secondary | ICD-10-CM | POA: Diagnosis not present

## 2021-10-14 DIAGNOSIS — M1711 Unilateral primary osteoarthritis, right knee: Secondary | ICD-10-CM | POA: Diagnosis not present

## 2021-10-14 DIAGNOSIS — R262 Difficulty in walking, not elsewhere classified: Secondary | ICD-10-CM | POA: Diagnosis not present

## 2021-10-14 DIAGNOSIS — M6281 Muscle weakness (generalized): Secondary | ICD-10-CM | POA: Diagnosis not present

## 2021-10-19 DIAGNOSIS — R262 Difficulty in walking, not elsewhere classified: Secondary | ICD-10-CM | POA: Diagnosis not present

## 2021-10-19 DIAGNOSIS — M1711 Unilateral primary osteoarthritis, right knee: Secondary | ICD-10-CM | POA: Diagnosis not present

## 2021-10-19 DIAGNOSIS — M25561 Pain in right knee: Secondary | ICD-10-CM | POA: Diagnosis not present

## 2021-10-19 DIAGNOSIS — M6281 Muscle weakness (generalized): Secondary | ICD-10-CM | POA: Diagnosis not present

## 2021-10-20 ENCOUNTER — Encounter: Payer: Self-pay | Admitting: Nurse Practitioner

## 2021-10-20 ENCOUNTER — Ambulatory Visit: Payer: Self-pay

## 2021-10-20 ENCOUNTER — Ambulatory Visit (INDEPENDENT_AMBULATORY_CARE_PROVIDER_SITE_OTHER): Payer: Medicare Other | Admitting: Nurse Practitioner

## 2021-10-20 ENCOUNTER — Other Ambulatory Visit: Payer: Self-pay

## 2021-10-20 DIAGNOSIS — R350 Frequency of micturition: Secondary | ICD-10-CM

## 2021-10-20 NOTE — Telephone Encounter (Signed)
  Chief Complaint: UTI Symptoms: urinary frequency Frequency: 3 weeks Pertinent Negatives: Patient denies any other urinary symptoms Disposition: '[]'$ ED /'[]'$ Urgent Care (no appt availability in office) / '[x]'$ Appointment(In office/virtual)/ '[]'$  [facility]/ '[]'$ Home Care/ '[]'$ Refused Recommended Disposition /'[]'$  Mobile Bus/ '[]'$  Follow-up with PCP Additional Notes: pt didn't want to come in for OV but was ok with scheduling for telephone visit and if orders placed will come to office to give urine sample. Pt says she has Interstitial cystitis and normally symptoms don't last this long.   Summary: ? possibe UTI   Frequent urination for 3 weeks, no odor, no abdominal pain. Requesting a urine order Patient declined appointment for today because she has a therapist appointment this afternoon       Reason for Disposition  Urinating more frequently than usual (i.e., frequency)  Answer Assessment - Initial Assessment Questions 1. SYMPTOM: "What's the main symptom you're concerned about?" (e.g., frequency, incontinence)     frequency 2. ONSET: "When did the  sx  start?"     3 weeks  3. PAIN: "Is there any pain?" If Yes, ask: "How bad is it?" (Scale: 1-10; mild, moderate, severe)     No pain 4. CAUSE: "What do you think is causing the symptoms?"     Possible UTI 5. OTHER SYMPTOMS: "Do you have any other symptoms?" (e.g., fever, flank pain, blood in urine, pain with urination)     none  Protocols used: Urinary Symptoms-A-AH

## 2021-10-20 NOTE — Progress Notes (Signed)
Name: Katherine Gould   MRN: [id number]    DOB: 1950-07-16   Date:10/20/2021       Progress Note  Subjective  Chief Complaint  Chief Complaint  Patient presents with   Urinary Frequency    For 3 weeks    I connected with  [name]  on 10/20/21 at 1150 am by a telephone enabled telemedicine application and verified that I am speaking with the correct person using two identifiers.  I discussed the limitations of evaluation and management by telemedicine and the availability of in person appointments. The patient expressed understanding and agreed to proceed with a virtual visit  Staff also discussed with the patient that there may be a patient responsible charge related to this service. Patient Location: home Provider Location: [facility] Additional Individuals present: alone  HPI  Urinary frequency: She reports over the last 3 weeks she has had some urinary frequency.  Patient states she does have a history of interstitial cystitis.  She is currently taking aloe vera tablets for that.  Patient denies any dysuria or fever.  Patient denies any lower back pain.  Patient states she will come by later and drop off a urine sample.  We will do a urine dip and send for culture.  Patient Active Problem List   Diagnosis Date Noted   Leg cramps 03/03/2021   Insomnia 12/16/2020   Hyperhidrosis 12/16/2020   Chronic fatigue 09/21/2020   Severe episode of recurrent major depressive disorder, without psychotic features ([place]) 11/14/2019   Habitual self-excoriation 11/14/2019   GAD (generalized anxiety disorder) 11/14/2019   Attention deficit hyperactivity disorder (ADHD), predominantly hyperactive type 11/14/2019   Familial hyperlipidemia 08/21/2019   Postmenopausal estrogen deficiency 04/08/2019   GERD (gastroesophageal reflux disease) 12/10/2018   OCD (obsessive compulsive disorder) 04/15/2018   Morbid obesity ([place]) 04/15/2018   Medication monitoring encounter 04/15/2018   Vitamin B12 deficiency  04/15/2018   Vitamin D deficiency 04/15/2018   Bilateral primary osteoarthritis of knee 04/15/2018   Interstitial cystitis 04/15/2018   OSA (obstructive sleep apnea) 07/14/2015   Hypertension    Hyperlipidemia 07/03/2012   Prediabetes 07/03/2012    Social History   Tobacco Use   Smoking status: Never   Smokeless tobacco: Never  Substance Use Topics   Alcohol use: No     Current Outpatient Medications:    bisoprolol-hydrochlorothiazide (ZIAC) 10-6.25 MG tablet, TAKE 1 TABLET BY MOUTH EVERY DAY, Disp: 90 tablet, Rfl: 3   Cholecalciferol (VITAMIN D3) 2000 UNITS capsule, Take 2,000 Units by mouth daily. Pt taking 4000 IU daily, Disp: , Rfl:    clonazePAM (KLONOPIN) 1 MG tablet, Take 1 mg by mouth 2 (two) times daily., Disp: , Rfl:    famotidine (PEPCID) 20 MG tablet, Take 20 mg by mouth 2 (two) times daily., Disp: , Rfl:    gabapentin (NEURONTIN) 100 MG capsule, Take 1 capsule (100 mg total) by mouth at bedtime., Disp: 90 capsule, Rfl: 1   Magnesium 200 MG TABS, Take 1 tablet by mouth daily., Disp: , Rfl:    Misc Natural Products (GLUCOSAMINE CHOND COMPLEX/MSM) TABS, Take 2 capsules by mouth daily., Disp: , Rfl:    silver sulfADIAZINE (SILVADENE) 1 % cream, Apply topically 2 (two) times daily., Disp: 50 g, Rfl: 0   Turmeric 500 MG CAPS, Take 1,000 mg by mouth. , Disp: , Rfl:    venlafaxine XR (EFFEXOR-XR) 75 MG 24 hr capsule, Take 75 mg by mouth daily., Disp: , Rfl:    vitamin B-12 (  CYANOCOBALAMIN) 1000 MCG tablet, Take 1,000 mcg by mouth daily., Disp: , Rfl:   Allergies  Allergen Reactions   Aripiprazole Other (See Comments)    Incubus Dream tremmors    Quetiapine Other (See Comments)    Incubus dream    I personally reviewed active problem list, medication list, allergies, notes from last encounter with the patient/caregiver today.  ROS  Constitutional: Negative for fever or weight change.  Respiratory: Negative for cough and shortness of breath.   Cardiovascular:  Negative for chest pain or palpitations.  Gastrointestinal: Negative for abdominal pain, no bowel changes.  Musculoskeletal: Negative for gait problem or joint swelling.  Skin: Negative for rash.  Neurological: Negative for dizziness or headache.  No other specific complaints in a complete review of systems (except as listed in HPI above).   Objective  Virtual encounter, vitals not obtained.  There is no height or weight on file to calculate BMI.  Nursing Note and Vital Signs reviewed.  Physical Exam  Awake, alert and oriented, speaking in complete sentences  No results found for this or any previous visit (from the past 72 hour(s)).  Assessment & Plan  1. Urinary frequency Comments: Urine dip and urine culture we will treat if indicated - Urine Culture - POCT urinalysis dipstick    -Red flags and when to present for emergency care or RTC including fever >101.18F, chest pain, shortness of breath, new/worsening/un-resolving symptoms,  reviewed with patient at time of visit. Follow up and care instructions discussed and provided in AVS. - I discussed the assessment and treatment plan with the patient. The patient was provided an opportunity to ask questions and all were answered. The patient agreed with the plan and demonstrated an understanding of the instructions.  I provided 20 minutes of non-face-to-face time during this encounter.  Bo Merino, FNP

## 2021-10-21 DIAGNOSIS — R262 Difficulty in walking, not elsewhere classified: Secondary | ICD-10-CM | POA: Diagnosis not present

## 2021-10-21 DIAGNOSIS — R350 Frequency of micturition: Secondary | ICD-10-CM | POA: Diagnosis not present

## 2021-10-21 DIAGNOSIS — M25561 Pain in right knee: Secondary | ICD-10-CM | POA: Diagnosis not present

## 2021-10-21 DIAGNOSIS — M6281 Muscle weakness (generalized): Secondary | ICD-10-CM | POA: Diagnosis not present

## 2021-10-21 DIAGNOSIS — M1711 Unilateral primary osteoarthritis, right knee: Secondary | ICD-10-CM | POA: Diagnosis not present

## 2021-10-22 LAB — URINE CULTURE
MICRO NUMBER:: 13619182
SPECIMEN QUALITY:: ADEQUATE

## 2021-10-24 ENCOUNTER — Telehealth: Payer: Self-pay | Admitting: Internal Medicine

## 2021-10-24 NOTE — Telephone Encounter (Signed)
Pt notified, neg urine culture

## 2021-10-24 NOTE — Telephone Encounter (Signed)
Copied from [place] [id number]. Topic: General - Other >> Oct 24, 2021  9:43 AM Rudene Anda wrote: Reason for CRM: Pt called in for results from her latest Urine test, please advise .

## 2021-10-26 DIAGNOSIS — M1711 Unilateral primary osteoarthritis, right knee: Secondary | ICD-10-CM | POA: Diagnosis not present

## 2021-10-26 DIAGNOSIS — M25561 Pain in right knee: Secondary | ICD-10-CM | POA: Diagnosis not present

## 2021-10-26 DIAGNOSIS — R262 Difficulty in walking, not elsewhere classified: Secondary | ICD-10-CM | POA: Diagnosis not present

## 2021-10-26 DIAGNOSIS — M6281 Muscle weakness (generalized): Secondary | ICD-10-CM | POA: Diagnosis not present

## 2021-10-26 NOTE — Progress Notes (Signed)
Established Patient Office Visit  Subjective:  Patient ID: Katherine Gould, female    DOB: 05-May-1950  Age: 71 y.o. MRN: [id number]  CC:  Chief Complaint  Patient presents with   Follow-up    HPI [name] presents for follow up.  Hypertension/OSA: -Medications: Bisoprolol-HCTZ 10-6.25 -Patient is compliant with above medications and reports no side effects. -Checking BP at home (average): doesn't check -Denies any SOB, CP, vision changes, LE edema or symptoms of hypotension  HLD: -Medications: Nothing currently, given statins in the past but doesn't want to take it yet. No history of intolerance.  -Last lipid panel: Lipid Panel     Component Value Date/Time   CHOL 225 (H) 09/21/2020 1451   TRIG 174 (H) 09/21/2020 1451   HDL 51 09/21/2020 1451   CHOLHDL 4.4 09/21/2020 1451   LDLCALC 143 (H) 09/21/2020 1451   MDD: -Mood status: worse -Current treatment: Effexor 75, Klonopin as needed - following with [facility], working on tapering down on dose.  -Satisfied with current treatment?: yes -Symptom severity: severe  -Duration of current treatment : chronic -Side effects: no Medication compliance: excellent compliance Psychotherapy/counseling: yes current - seeing therapist every Thursday, seeing later today.     10/27/2021    1:04 PM 10/20/2021   11:34 AM 07/26/2021    8:16 AM 03/03/2021    2:16 PM 12/16/2020    1:35 PM  Depression screen PHQ 2/9  Decreased Interest 3 0 0 3 3  Down, Depressed, Hopeless 3 0 0 3 3  PHQ - 2 Score 6 0 0 6 6  Altered sleeping 3  0 3 3  Tired, decreased energy 3  0 3 3  Change in appetite 3  0 3 3  Feeling bad or failure about yourself  2  0 3 3  Trouble concentrating 3  0 3 3  Moving slowly or fidgety/restless 3  0 3 3  Suicidal thoughts 2  0 2 1  PHQ-9 Score 25  0 26 25  Difficult doing work/chores Somewhat difficult  Not difficult at all Very difficult Very difficult   Prediabetes: -Last A1c 4/21 5.8%  CKD: -Last creatinine  0.98 6/22  Interstitial Cystitis: -Had been having bladder discomfort for about 1 month, urine culture here last week negative for acute infection, showing mixed growth. Today denies dysuria, hematuria. Had been going more frequently. Had been seeing Urology in the past but hasn't had to go in the last 3 years.   GERD: -Currently on Pepcid 20 as needed once or twice a week  -Symptoms include gas, bloating and abdominal discomfort   Health Maintenance: -Blood work due -Mammogram/DEXA due  Past Medical History:  Diagnosis Date   Cervical polyp    Chronic kidney disease    CKD (chronic kidney disease)    Dysthymic disorder    Esophageal reflux    Hyperlipidemia    Hypertension    Interstitial cystitis    Obsessive-compulsive disorders    Other abnormal glucose    Other abnormal glucose    Other B-complex deficiencies     Past Surgical History:  Procedure Laterality Date   LASER ABLATION CONDYLOMA CERVICAL / VULVAR      Family History  Problem Relation Age of Onset   Heart failure Mother    Aortic stenosis Mother    Hypertension Mother    Stroke Mother    Glaucoma Mother    Heart attack Father    Hypertension Father    Heart attack  Paternal Grandfather    Glaucoma Sister    Hypertension Sister    Atrial fibrillation Sister    Heart failure Sister    Anxiety disorder Sister     Social History   Socioeconomic History   Marital status: Divorced    Spouse name: Not on file   Number of children: 1   Years of education: 12   Highest education level: High school graduate  Occupational History   Occupation: disability  Tobacco Use   Smoking status: Never   Smokeless tobacco: Never  Vaping Use   Vaping Use: Never used  Substance and Sexual Activity   Alcohol use: No   Drug use: No   Sexual activity: Not Currently  Other Topics Concern   Not on file  Social History Narrative   Pt lives alone.    Social Determinants of Health   Financial Resource Strain:  Low Risk  (04/24/2019)   Overall Financial Resource Strain (CARDIA)    Difficulty of Paying Living Expenses: Not hard at all  Food Insecurity: No Food Insecurity (04/24/2019)   Hunger Vital Sign    Worried About Running Out of Food in the Last Year: Never true    Ran Out of Food in the Last Year: Never true  Transportation Needs: No Transportation Needs (04/24/2019)   PRAPARE - Hydrologist (Medical): No    Lack of Transportation (Non-Medical): No  Physical Activity: Inactive (04/24/2019)   Exercise Vital Sign    Days of Exercise per Week: 0 days    Minutes of Exercise per Session: 0 min  Stress: Stress Concern Present (04/24/2019)   [place]y    Feeling of Stress : Very much  Social Connections: Socially Isolated (04/24/2019)   Social Connection and Isolation Panel [NHANES]    Frequency of Communication with Friends and Family: Once a week    Frequency of Social Gatherings with Friends and Family: Once a week    Attends Religious Services: Never    Marine scientist or Organizations: No    Attends Archivist Meetings: Never    Marital Status: Divorced  Human resources officer Violence: Not At Risk (04/24/2019)   Humiliation, Afraid, Rape, and Kick questionnaire    Fear of Current or Ex-Partner: No    Emotionally Abused: No    Physically Abused: No    Sexually Abused: No    Outpatient Medications Prior to Visit  Medication Sig Dispense Refill   bisoprolol-hydrochlorothiazide (ZIAC) 10-6.25 MG tablet TAKE 1 TABLET BY MOUTH EVERY DAY 90 tablet 3   Cholecalciferol (VITAMIN D3) 2000 UNITS capsule Take 2,000 Units by mouth daily. Pt taking 4000 IU daily     clonazePAM (KLONOPIN) 1 MG tablet Take 1 mg by mouth 2 (two) times daily.     famotidine (PEPCID) 20 MG tablet Take 20 mg by mouth 2 (two) times daily.     Magnesium 200 MG TABS Take 1 tablet by mouth daily.     silver sulfADIAZINE  (SILVADENE) 1 % cream Apply topically 2 (two) times daily. 50 g 0   Turmeric 500 MG CAPS Take 1,000 mg by mouth.      venlafaxine XR (EFFEXOR-XR) 75 MG 24 hr capsule Take 75 mg by mouth daily.     gabapentin (NEURONTIN) 100 MG capsule Take 1 capsule (100 mg total) by mouth at bedtime. (Patient not taking: Reported on 10/27/2021) 90 capsule 1   Misc Natural Products (GLUCOSAMINE  CHOND COMPLEX/MSM) TABS Take 2 capsules by mouth daily. (Patient not taking: Reported on 10/27/2021)     vitamin B-12 (CYANOCOBALAMIN) 1000 MCG tablet Take 1,000 mcg by mouth daily. (Patient not taking: Reported on 10/27/2021)     No facility-administered medications prior to visit.    Allergies  Allergen Reactions   Aripiprazole Other (See Comments)    Incubus Dream tremmors    Quetiapine Other (See Comments)    Incubus dream    ROS Review of Systems  Constitutional:  Negative for chills and fever.  Eyes:  Negative for visual disturbance.  Respiratory:  Negative for cough and shortness of breath.   Cardiovascular:  Negative for chest pain.  Gastrointestinal:  Negative for abdominal pain.  Genitourinary:  Positive for frequency and urgency. Negative for dysuria and hematuria.  Skin:  Positive for color change.  Neurological:  Negative for dizziness and headaches.      Objective:    Physical Exam Constitutional:      Appearance: Normal appearance.  HENT:     Head: Normocephalic and atraumatic.  Eyes:     Conjunctiva/sclera: Conjunctivae normal.  Cardiovascular:     Rate and Rhythm: Normal rate and regular rhythm.  Pulmonary:     Effort: Pulmonary effort is normal.     Breath sounds: Normal breath sounds.  Musculoskeletal:     Right lower leg: No edema.     Left lower leg: No edema.  Skin:    General: Skin is warm and dry.     Comments: Scars on legs from self-picking, 2 acute lesions with mild erythema, no drainage or signs of infection   Neurological:     General: No focal deficit present.      Mental Status: She is alert. Mental status is at baseline.  Psychiatric:        Mood and Affect: Mood normal.        Behavior: Behavior normal.     BP 118/82   Pulse 82   Temp 97.8 F (36.6 C)   Resp 18   Ht '5\' 5"'$  (1.651 m)   Wt 217 lb 14.4 oz (98.8 kg)   SpO2 90%   BMI 36.26 kg/m  Wt Readings from Last 3 Encounters:  10/27/21 217 lb 14.4 oz (98.8 kg)  07/26/21 217 lb 6.4 oz (98.6 kg)  03/03/21 216 lb 3.2 oz (98.1 kg)     Health Maintenance Due  Topic Date Due   DEXA SCAN  Never done   MAMMOGRAM  Never done   Zoster Vaccines- Shingrix (1 of 2) Never done   COVID-19 Vaccine (4 - Pfizer series) 07/08/2020    There are no preventive care reminders to display for this patient.  Lab Results  Component Value Date   TSH 1.69 09/21/2020   Lab Results  Component Value Date   WBC 6.6 09/21/2020   HGB 14.0 09/21/2020   HCT 42.2 09/21/2020   MCV 91.7 09/21/2020   PLT 263 09/21/2020   Lab Results  Component Value Date   NA 141 09/21/2020   K 4.2 09/21/2020   CO2 29 09/21/2020   GLUCOSE 86 09/21/2020   BUN 17 09/21/2020   CREATININE 0.98 09/21/2020   BILITOT 0.5 09/21/2020   AST 11 09/21/2020   ALT 9 09/21/2020   PROT 7.0 09/21/2020   CALCIUM 9.6 09/21/2020   Lab Results  Component Value Date   CHOL 225 (H) 09/21/2020   Lab Results  Component Value Date   HDL 51 09/21/2020  Lab Results  Component Value Date   LDLCALC 143 (H) 09/21/2020   Lab Results  Component Value Date   TRIG 174 (H) 09/21/2020   Lab Results  Component Value Date   CHOLHDL 4.4 09/21/2020   Lab Results  Component Value Date   HGBA1C 5.8 (H) 07/31/2019      Assessment & Plan:   1. Essential hypertension: Stable.  Blood pressure at goal today.  Continue bisoprolol-HCTZ 10-6.25 mg daily.  Refills ordered today.  Due for routine labs, CBC and CMP today.  - CBC w/Diff/Platelet - COMPLETE METABOLIC PANEL WITH GFR - bisoprolol-hydrochlorothiazide (ZIAC) 10-6.25 MG tablet;  Take 1 tablet by mouth daily.  Dispense: 90 tablet; Refill: 1  2. Familial hyperlipidemia: Stable, not currently being treated as the patient does not want to take statins.  Recheck lipid panel today, based on results can discuss something like Repatha.  - Lipid Profile  3. Severe episode of recurrent major depressive disorder, without psychotic features ([place]): Exacerbated today.  Following up with therapist later today.  Does have a psychiatrist who is working on decreasing her Effexor to switch her to a different SSRI.  4. Prediabetes: Recheck A1c today.  - HgB A1c  5. Stage 3a chronic kidney disease ([place]): Recheck kidney function today.   6. Interstitial cystitis: No UTI symptoms today, last urine culture from last week negative.  She will let me know if her symptoms continue to worsen and a referral to urology can be placed.  In the meantime avoid caffeine and carbonated beverages that can irritate the bladder.  7. Gastroesophageal reflux disease without esophagitis: Stable.  Continue Pepcid 20 mg as needed.  8. Vitamin D deficiency/Hypomagnesemia/Chronic fatigue: TSH, vitamin D levels and magnesium levels with above labs.  - Vitamin D (25 hydroxy) - Magnesium - TSH   Follow-up: Return in about 6 months (around 04/29/2022).    Teodora Medici, DO

## 2021-10-27 ENCOUNTER — Ambulatory Visit (INDEPENDENT_AMBULATORY_CARE_PROVIDER_SITE_OTHER): Payer: Medicare Other | Admitting: Internal Medicine

## 2021-10-27 ENCOUNTER — Encounter: Payer: Self-pay | Admitting: Internal Medicine

## 2021-10-27 VITALS — BP 118/82 | HR 82 | Temp 97.8°F | Resp 18 | Ht 65.0 in | Wt 217.9 lb

## 2021-10-27 DIAGNOSIS — R7303 Prediabetes: Secondary | ICD-10-CM

## 2021-10-27 DIAGNOSIS — F332 Major depressive disorder, recurrent severe without psychotic features: Secondary | ICD-10-CM

## 2021-10-27 DIAGNOSIS — I10 Essential (primary) hypertension: Secondary | ICD-10-CM | POA: Diagnosis not present

## 2021-10-27 DIAGNOSIS — R5382 Chronic fatigue, unspecified: Secondary | ICD-10-CM

## 2021-10-27 DIAGNOSIS — N1831 Chronic kidney disease, stage 3a: Secondary | ICD-10-CM

## 2021-10-27 DIAGNOSIS — E559 Vitamin D deficiency, unspecified: Secondary | ICD-10-CM | POA: Diagnosis not present

## 2021-10-27 DIAGNOSIS — E7849 Other hyperlipidemia: Secondary | ICD-10-CM | POA: Diagnosis not present

## 2021-10-27 DIAGNOSIS — N3010 Interstitial cystitis (chronic) without hematuria: Secondary | ICD-10-CM

## 2021-10-27 DIAGNOSIS — K219 Gastro-esophageal reflux disease without esophagitis: Secondary | ICD-10-CM

## 2021-10-27 MED ORDER — BISOPROLOL-HYDROCHLOROTHIAZIDE 10-6.25 MG PO TABS: 1.0000 | ORAL_TABLET | Freq: Every day | ORAL | 1 refills | Status: DC

## 2021-10-27 NOTE — Patient Instructions (Addendum)
It was great seeing you today!  Plan discussed at today's visit: -Blood work ordered today, results will be uploaded to [facility].  -Continue medications, refill of BP medication sent.   Follow up in: 6 months   Take care and let us know if you have any questions or concerns prior to your next visit.  Dr. Rosana Berger

## 2021-10-28 DIAGNOSIS — M25561 Pain in right knee: Secondary | ICD-10-CM | POA: Diagnosis not present

## 2021-10-28 DIAGNOSIS — R262 Difficulty in walking, not elsewhere classified: Secondary | ICD-10-CM | POA: Diagnosis not present

## 2021-10-28 DIAGNOSIS — M6281 Muscle weakness (generalized): Secondary | ICD-10-CM | POA: Diagnosis not present

## 2021-10-28 DIAGNOSIS — M1711 Unilateral primary osteoarthritis, right knee: Secondary | ICD-10-CM | POA: Diagnosis not present

## 2021-10-28 LAB — COMPLETE METABOLIC PANEL WITH GFR
AG Ratio: 1.5 (calc) (ref 1.0–2.5)
ALT: 11 U/L (ref 6–29)
AST: 15 U/L (ref 10–35)
Albumin: 4 g/dL (ref 3.6–5.1)
Alkaline phosphatase (APISO): 65 U/L (ref 37–153)
BUN/Creatinine Ratio: 12 (calc) (ref 6–22)
BUN: 14 mg/dL (ref 7–25)
CO2: 29 mmol/L (ref 20–32)
Calcium: 9.6 mg/dL (ref 8.6–10.4)
Chloride: 103 mmol/L (ref 98–110)
Creat: 1.16 mg/dL — ABNORMAL HIGH (ref 0.60–1.00)
Globulin: 2.7 g/dL (calc) (ref 1.9–3.7)
Glucose, Bld: 103 mg/dL — ABNORMAL HIGH (ref 65–99)
Potassium: 4.5 mmol/L (ref 3.5–5.3)
Sodium: 141 mmol/L (ref 135–146)
Total Bilirubin: 0.5 mg/dL (ref 0.2–1.2)
Total Protein: 6.7 g/dL (ref 6.1–8.1)
eGFR: 50 mL/min/{1.73_m2} — ABNORMAL LOW (ref >=60)

## 2021-10-28 LAB — HEMOGLOBIN A1C
Hgb A1c MFr Bld: 5.6 % of total Hgb (ref <5.7)
Mean Plasma Glucose: 114 mg/dL
eAG (mmol/L): 6.3 mmol/L

## 2021-10-28 LAB — LIPID PANEL
Cholesterol: 204 mg/dL — ABNORMAL HIGH (ref <200)
HDL: 55 mg/dL (ref >=50)
LDL Cholesterol (Calc): 122 mg/dL (calc) — ABNORMAL HIGH
Non-HDL Cholesterol (Calc): 149 mg/dL (calc) — ABNORMAL HIGH (ref <130)
Total CHOL/HDL Ratio: 3.7 (calc) (ref <5.0)
Triglycerides: 152 mg/dL — ABNORMAL HIGH (ref <150)

## 2021-10-28 LAB — CBC WITH DIFFERENTIAL/PLATELET
Absolute Monocytes: 412 cells/uL (ref 200–950)
Basophils Absolute: 41 cells/uL (ref 0–200)
Basophils Relative: 0.7 %
Eosinophils Absolute: 110 cells/uL (ref 15–500)
Eosinophils Relative: 1.9 %
HCT: 44 % (ref 35.0–45.0)
Hemoglobin: 14.5 g/dL (ref 11.7–15.5)
Lymphs Abs: 1949 cells/uL (ref 850–3900)
MCH: 30.7 pg (ref 27.0–33.0)
MCHC: 33 g/dL (ref 32.0–36.0)
MCV: 93 fL (ref 80.0–100.0)
MPV: 10.3 fL (ref 7.5–12.5)
Monocytes Relative: 7.1 %
Neutro Abs: 3289 cells/uL (ref 1500–7800)
Neutrophils Relative %: 56.7 %
Platelets: 257 10*3/uL (ref 140–400)
RBC: 4.73 10*6/uL (ref 3.80–5.10)
RDW: 13.1 % (ref 11.0–15.0)
Total Lymphocyte: 33.6 %
WBC: 5.8 10*3/uL (ref 3.8–10.8)

## 2021-10-28 LAB — TSH: TSH: 1.26 mIU/L (ref 0.40–4.50)

## 2021-10-28 LAB — VITAMIN D 25 HYDROXY (VIT D DEFICIENCY, FRACTURES): Vit D, 25-Hydroxy: 70 ng/mL (ref 30–100)

## 2021-10-28 LAB — MAGNESIUM: Magnesium: 2 mg/dL (ref 1.5–2.5)

## 2021-11-02 DIAGNOSIS — R262 Difficulty in walking, not elsewhere classified: Secondary | ICD-10-CM | POA: Diagnosis not present

## 2021-11-02 DIAGNOSIS — M25561 Pain in right knee: Secondary | ICD-10-CM | POA: Diagnosis not present

## 2021-11-02 DIAGNOSIS — M1711 Unilateral primary osteoarthritis, right knee: Secondary | ICD-10-CM | POA: Diagnosis not present

## 2021-11-02 DIAGNOSIS — M6281 Muscle weakness (generalized): Secondary | ICD-10-CM | POA: Diagnosis not present

## 2021-11-07 ENCOUNTER — Telehealth: Payer: Self-pay | Admitting: Internal Medicine

## 2021-11-07 NOTE — Telephone Encounter (Signed)
Patient requesting a urine order only. Patient informed last urine specimen was contaminated therefore patient would like to do a clean catch and would like to come in tomorrow, please advise

## 2021-11-07 NOTE — Telephone Encounter (Signed)
Last culture on 7/7 was normal

## 2021-11-09 DIAGNOSIS — M1711 Unilateral primary osteoarthritis, right knee: Secondary | ICD-10-CM | POA: Diagnosis not present

## 2021-11-09 DIAGNOSIS — M25561 Pain in right knee: Secondary | ICD-10-CM | POA: Diagnosis not present

## 2021-11-09 DIAGNOSIS — R262 Difficulty in walking, not elsewhere classified: Secondary | ICD-10-CM | POA: Diagnosis not present

## 2021-11-09 DIAGNOSIS — M6281 Muscle weakness (generalized): Secondary | ICD-10-CM | POA: Diagnosis not present

## 2021-11-09 NOTE — Telephone Encounter (Signed)
Left detailed vm °

## 2021-11-11 DIAGNOSIS — R262 Difficulty in walking, not elsewhere classified: Secondary | ICD-10-CM | POA: Diagnosis not present

## 2021-11-11 DIAGNOSIS — M25561 Pain in right knee: Secondary | ICD-10-CM | POA: Diagnosis not present

## 2021-11-11 DIAGNOSIS — M6281 Muscle weakness (generalized): Secondary | ICD-10-CM | POA: Diagnosis not present

## 2021-11-11 DIAGNOSIS — M1711 Unilateral primary osteoarthritis, right knee: Secondary | ICD-10-CM | POA: Diagnosis not present

## 2021-11-14 ENCOUNTER — Encounter: Payer: Self-pay | Admitting: Nurse Practitioner

## 2021-11-14 ENCOUNTER — Ambulatory Visit (INDEPENDENT_AMBULATORY_CARE_PROVIDER_SITE_OTHER): Payer: Medicare Other | Admitting: Nurse Practitioner

## 2021-11-14 ENCOUNTER — Other Ambulatory Visit: Payer: Self-pay

## 2021-11-14 VITALS — BP 130/78 | HR 77 | Temp 98.0°F | Resp 16 | Ht 65.0 in | Wt 214.8 lb

## 2021-11-14 DIAGNOSIS — N3010 Interstitial cystitis (chronic) without hematuria: Secondary | ICD-10-CM | POA: Diagnosis not present

## 2021-11-14 DIAGNOSIS — R350 Frequency of micturition: Secondary | ICD-10-CM

## 2021-11-14 LAB — POCT URINALYSIS DIPSTICK
Bilirubin, UA: NEGATIVE
Glucose, UA: NEGATIVE
Nitrite, UA: NEGATIVE
Protein, UA: POSITIVE — AB
Spec Grav, UA: 1.02 (ref 1.010–1.025)
Urobilinogen, UA: 0.2 E.U./dL
pH, UA: 5 (ref 5.0–8.0)

## 2021-11-14 MED ORDER — CEPHALEXIN 500 MG PO CAPS: 500.0000 mg | ORAL_CAPSULE | Freq: Two times a day (BID) | ORAL | 0 refills | Status: AC

## 2021-11-14 NOTE — Assessment & Plan Note (Signed)
If no improvement with treatment will refer to urology.  Patient is in agreement with plan.

## 2021-11-14 NOTE — Progress Notes (Signed)
BP 130/78   Pulse 77   Temp 98 F (36.7 C) (Oral)   Resp 16   Ht '5\' 5"'$  (1.651 m)   Wt 214 lb 12.8 oz (97.4 kg)   SpO2 97%   BMI 35.74 kg/m    Subjective:    Patient ID: Katherine Gould, female    DOB: Mar 17, 1951, 71 y.o.   MRN: [id number]  HPI: Katherine Gould is a 71 y.o. female  Chief Complaint  Patient presents with   Urinary Tract Infection   Urinary frequency: She was seen on 10/04/2021 for urinary frequency.  Urine culture did not grow out a specific bacteria.  Did not treat her with antibiotics.  Patient was then seen on 10/27/2021.  Where she reported she was doing better.  She is back today stating that she is having urinary frequency and residual urine in bladder.  She denies any fever, back pain.  She does have a history of interstitial cystitis.  She has not been to urology in the last 3 years.Patient's urine dip showed blood, positive protein, small leukocytes.  Urine for culture.  Start patient on Keflex.  Patient has been on [place] previously but states she had allergic reaction.  Discussed with patient to increase fluid intake.  If no improvement with treatment will send to urology.  Depression: She is currently in therapy.  She is currently taking Effexor 75 mg daily and Klonopin 1 mg 2 times a day.  [facility] these prescriptions from Dr[facility]  Discussed that her PHQ-9 scores are elevated today.  Patient states she feels fine.  This is her normal.     We will continue with current treatment at this time.    11/14/2021    3:52 PM 10/27/2021    1:04 PM 10/20/2021   11:34 AM 07/26/2021    8:16 AM 03/03/2021    2:16 PM  Depression screen PHQ 2/9  Decreased Interest 3 3 0 0 3  Down, Depressed, Hopeless 3 3 0 0 3  PHQ - 2 Score 6 6 0 0 6  Altered sleeping 3 3  0 3  Tired, decreased energy 3 3  0 3  Change in appetite 3 3  0 3  Feeling bad or failure about yourself  2 2  0 3  Trouble concentrating 3 3  0 3  Moving slowly or fidgety/restless 3 3  0 3  Suicidal  thoughts 2 2  0 2  PHQ-9 Score 25 25  0 26  Difficult doing work/chores Very difficult Somewhat difficult  Not difficult at all Very difficult    Relevant past medical, surgical, family and social history reviewed and updated as indicated. Interim medical history since our last visit reviewed. Allergies and medications reviewed and updated.  Review of Systems  Constitutional: Negative for fever or weight change.  Respiratory: Negative for cough and shortness of breath.   Cardiovascular: Negative for chest pain or palpitations.  Gastrointestinal: Negative for abdominal pain, no bowel changes.  Musculoskeletal: Negative for gait problem or joint swelling.  Skin: Negative for rash.  Neurological: Negative for dizziness or headache.  No other specific complaints in a complete review of systems (except as listed in HPI above).      Objective:    BP 130/78   Pulse 77   Temp 98 F (36.7 C) (Oral)   Resp 16   Ht '5\' 5"'$  (1.651 m)   Wt 214 lb 12.8 oz (97.4 kg)   SpO2  97%   BMI 35.74 kg/m   Wt Readings from Last 3 Encounters:  11/14/21 214 lb 12.8 oz (97.4 kg)  10/27/21 217 lb 14.4 oz (98.8 kg)  07/26/21 217 lb 6.4 oz (98.6 kg)    Physical Exam  Constitutional: Patient appears well-developed and well-nourished. Obese  No distress.  HEENT: head atraumatic, normocephalic, pupils equal and reactive to light,  neck supple Cardiovascular: Normal rate, regular rhythm and normal heart sounds.  No murmur heard. No BLE edema. Pulmonary/Chest: Effort normal and breath sounds normal. No respiratory distress. Abdominal: Soft.  There is no tenderness.No CVA tenderness Psychiatric: Patient has a normal mood and affect. behavior is normal. Judgment and thought content normal.  Results for orders placed or performed in visit on 11/14/21  POCT urinalysis dipstick  Result Value Ref Range   Color, UA yellow    Clarity, UA cloudy    Glucose, UA Negative Negative   Bilirubin, UA negative     Ketones, UA small    Spec Grav, UA 1.020 1.010 - 1.025   Blood, UA small    pH, UA 5.0 5.0 - 8.0   Protein, UA Positive (A) Negative   Urobilinogen, UA 0.2 0.2 or 1.0 E.U./dL   Nitrite, UA negative    Leukocytes, UA Small (1+) (A) Negative   Appearance cloudy    Odor small       Assessment & Plan:   Problem List Items Addressed This Visit       Genitourinary   Interstitial cystitis    If no improvement with treatment will refer to urology.  Patient is in agreement with plan.      Other Visit Diagnoses     Urinary frequency    -  Primary   10 urine dip and culture.  We will start patient on Keflex for treatment.  Increase fluid intake.  If no improvement will send to urology.   Relevant Medications   cephALEXin (KEFLEX) 500 MG capsule   Other Relevant Orders   POCT urinalysis dipstick (Completed)   Urine Culture        Follow up plan: Return if symptoms worsen or fail to improve, for follow up.

## 2021-11-15 LAB — URINE CULTURE
MICRO NUMBER:: 13717333
SPECIMEN QUALITY:: ADEQUATE

## 2021-11-16 ENCOUNTER — Other Ambulatory Visit: Payer: Self-pay | Admitting: Nurse Practitioner

## 2021-11-16 DIAGNOSIS — R350 Frequency of micturition: Secondary | ICD-10-CM

## 2021-11-16 DIAGNOSIS — N3010 Interstitial cystitis (chronic) without hematuria: Secondary | ICD-10-CM

## 2021-11-16 DIAGNOSIS — R262 Difficulty in walking, not elsewhere classified: Secondary | ICD-10-CM | POA: Diagnosis not present

## 2021-11-16 DIAGNOSIS — M25561 Pain in right knee: Secondary | ICD-10-CM | POA: Diagnosis not present

## 2021-11-16 DIAGNOSIS — M1711 Unilateral primary osteoarthritis, right knee: Secondary | ICD-10-CM | POA: Diagnosis not present

## 2021-11-16 DIAGNOSIS — M6281 Muscle weakness (generalized): Secondary | ICD-10-CM | POA: Diagnosis not present

## 2021-11-22 DIAGNOSIS — F332 Major depressive disorder, recurrent severe without psychotic features: Secondary | ICD-10-CM | POA: Diagnosis not present

## 2021-11-22 DIAGNOSIS — F411 Generalized anxiety disorder: Secondary | ICD-10-CM | POA: Diagnosis not present

## 2021-11-23 DIAGNOSIS — M25561 Pain in right knee: Secondary | ICD-10-CM | POA: Diagnosis not present

## 2021-11-23 DIAGNOSIS — M6281 Muscle weakness (generalized): Secondary | ICD-10-CM | POA: Diagnosis not present

## 2021-11-23 DIAGNOSIS — R262 Difficulty in walking, not elsewhere classified: Secondary | ICD-10-CM | POA: Diagnosis not present

## 2021-11-23 DIAGNOSIS — M1711 Unilateral primary osteoarthritis, right knee: Secondary | ICD-10-CM | POA: Diagnosis not present

## 2021-11-29 DIAGNOSIS — M1711 Unilateral primary osteoarthritis, right knee: Secondary | ICD-10-CM | POA: Diagnosis not present

## 2021-11-29 DIAGNOSIS — M6281 Muscle weakness (generalized): Secondary | ICD-10-CM | POA: Diagnosis not present

## 2021-11-29 DIAGNOSIS — R262 Difficulty in walking, not elsewhere classified: Secondary | ICD-10-CM | POA: Diagnosis not present

## 2021-11-29 DIAGNOSIS — M25561 Pain in right knee: Secondary | ICD-10-CM | POA: Diagnosis not present

## 2021-12-07 DIAGNOSIS — M25561 Pain in right knee: Secondary | ICD-10-CM | POA: Diagnosis not present

## 2021-12-07 DIAGNOSIS — M1711 Unilateral primary osteoarthritis, right knee: Secondary | ICD-10-CM | POA: Diagnosis not present

## 2021-12-07 DIAGNOSIS — M6281 Muscle weakness (generalized): Secondary | ICD-10-CM | POA: Diagnosis not present

## 2021-12-07 DIAGNOSIS — R262 Difficulty in walking, not elsewhere classified: Secondary | ICD-10-CM | POA: Diagnosis not present

## 2022-01-26 DIAGNOSIS — M1712 Unilateral primary osteoarthritis, left knee: Secondary | ICD-10-CM | POA: Diagnosis not present

## 2022-02-22 DIAGNOSIS — F332 Major depressive disorder, recurrent severe without psychotic features: Secondary | ICD-10-CM | POA: Diagnosis not present

## 2022-02-22 DIAGNOSIS — F411 Generalized anxiety disorder: Secondary | ICD-10-CM | POA: Diagnosis not present

## 2022-03-03 ENCOUNTER — Ambulatory Visit: Payer: Self-pay | Admitting: *Deleted

## 2022-03-03 NOTE — Telephone Encounter (Signed)
  Chief Complaint: cough nonproductive , congestion in chest at times. Requesting cough medication / OTC  Symptoms: cough, difficulty sleeping due to cough worse at night   Frequency: since Monday  Pertinent Negatives: Patient denies chest pain no difficulty breathing no fever, no other cold sx  Disposition: '[]'$ ED /'[]'$ Urgent Care (no appt availability in office) / '[]'$ Appointment(In office/virtual)/ '[]'$  [facility]/ '[]'$ Home Care/ '[]'$ Refused Recommended Disposition /'[]'$ [facility]/ '[x]'$  Follow-up with PCP Additional Notes:   Recommended appt and patient would like to know options for OTC medications for cough. Care advise given. Please advise      Reason for Disposition  Cough has been present for > 3 weeks  Answer Assessment - Initial Assessment Questions 1. ONSET: "When did the cough begin?"      Monday  2. SEVERITY: "How bad is the cough today?"      Worse at night  3. SPUTUM: "Describe the color of your sputum" (none, dry cough; clear, white, yellow, green)     na 4. HEMOPTYSIS: "Are you coughing up any blood?" If so ask: "How much?" (flecks, streaks, tablespoons, etc.)     na 5. DIFFICULTY BREATHING: "Are you having difficulty breathing?" If Yes, ask: "How bad is it?" (e.g., mild, moderate, severe)    - MILD: No SOB at rest, mild SOB with walking, speaks normally in sentences, can lie down, no retractions, pulse < 100.    - MODERATE: SOB at rest, SOB with minimal exertion and prefers to sit, cannot lie down flat, speaks in phrases, mild retractions, audible wheezing, pulse 100-120.    - SEVERE: Very SOB at rest, speaks in single words, struggling to breathe, sitting hunched forward, retractions, pulse > 120      denies 6. FEVER: "Do you have a fever?" If Yes, ask: "What is your temperature, how was it measured, and when did it start?"     na 7. CARDIAC HISTORY: "Do you have any history of heart disease?" (e.g., heart attack, congestive heart failure)      na 8. LUNG  HISTORY: "Do you have any history of lung disease?"  (e.g., pulmonary embolus, asthma, emphysema)     na 9. PE RISK FACTORS: "Do you have a history of blood clots?" (or: recent major surgery, recent prolonged travel, bedridden)     na 10. OTHER SYMPTOMS: "Do you have any other symptoms?" (e.g., runny nose, wheezing, chest pain)       Cough at night worse when laying down  11. PREGNANCY: "Is there any chance you are pregnant?" "When was your last menstrual period?"       na 12. TRAVEL: "Have you traveled out of the country in the last month?" (e.g., travel history, exposures)       na  Protocols used: Cough - Acute Non-Productive-A-AH

## 2022-03-03 NOTE — Telephone Encounter (Signed)
Summary: cough   Pt stated he has had a cough for a few days that started with a sore throat. Pt would like suggestions on what the best OTC medication she could purchase for cough pt mentioned this is her only symptom/ Pt mentioned the cough is mostly at night which has been keeping her up.  Pt seeking clinical advice.      Called patient to review sx of cough. No answer. LVMTCB [phone].

## 2022-03-14 DIAGNOSIS — M1712 Unilateral primary osteoarthritis, left knee: Secondary | ICD-10-CM | POA: Diagnosis not present

## 2022-03-26 ENCOUNTER — Telehealth: Payer: Self-pay | Admitting: Internal Medicine

## 2022-03-26 DIAGNOSIS — I10 Essential (primary) hypertension: Secondary | ICD-10-CM

## 2022-03-28 NOTE — Telephone Encounter (Signed)
Unable to refill per protocol, Rx request is too soon. Last refill 10/27/21 for 90 and 1 refill. Will refuse.  Requested Prescriptions  Pending Prescriptions Disp Refills   bisoprolol-hydrochlorothiazide (ZIAC) 10-6.25 MG tablet [Pharmacy Med Name: BISOPROLOL/HCTZ 10MG/6.25MG TABS] 90 tablet 1    Sig: TAKE 1 TABLET BY MOUTH DAILY     Cardiovascular: Beta Blocker + Diuretic Combos Failed - 03/26/2022  5:39 PM      Failed - Cr in normal range and within 180 days    Creat  Date Value Ref Range Status  10/27/2021 1.16 (H) 0.60 - 1.00 mg/dL Final         Failed - eGFR in normal range and within 180 days    GFR, Est African American  Date Value Ref Range Status  11/12/2019 74 > OR = 60 mL/min/1.39m Final   GFR, Est Non African American  Date Value Ref Range Status  11/12/2019 64 > OR = 60 mL/min/1.745mFinal   eGFR  Date Value Ref Range Status  10/27/2021 50 (L) > OR = 60 mL/min/1.7344minal    Comment:    The eGFR is based on the CKD-EPI 2021 equation. To calculate  the new eGFR from a previous Creatinine or Cystatin C result, go to https://www.kidney.org/professionals/ kdoqi/gfr%5Fcalculator          Passed - K in normal range and within 180 days    Potassium  Date Value Ref Range Status  10/27/2021 4.5 3.5 - 5.3 mmol/L Final         Passed - Na in normal range and within 180 days    Sodium  Date Value Ref Range Status  10/27/2021 141 135 - 146 mmol/L Final         Passed - Last BP in normal range    BP Readings from Last 1 Encounters:  11/14/21 130/78         Passed - Last Heart Rate in normal range    Pulse Readings from Last 1 Encounters:  11/14/21 77         Passed - Valid encounter within last 6 months    Recent Outpatient Visits           4 months ago Urinary frequency   [facility]   5 months ago Essential hypertension   [facility]   5 months ago Urinary frequency    [facility]   8 months ago Acute low back pain, unspecified back pain laterality, unspecified whether sciatica present   [facility]   1 year ago [facility] cramps   [facility]

## 2022-04-25 DIAGNOSIS — N289 Disorder of kidney and ureter, unspecified: Secondary | ICD-10-CM | POA: Diagnosis not present

## 2022-04-25 DIAGNOSIS — N3010 Interstitial cystitis (chronic) without hematuria: Secondary | ICD-10-CM | POA: Diagnosis not present

## 2022-04-25 DIAGNOSIS — R350 Frequency of micturition: Secondary | ICD-10-CM | POA: Diagnosis not present

## 2022-04-25 DIAGNOSIS — R3129 Other microscopic hematuria: Secondary | ICD-10-CM | POA: Diagnosis not present

## 2022-05-17 DIAGNOSIS — M1712 Unilateral primary osteoarthritis, left knee: Secondary | ICD-10-CM | POA: Diagnosis not present

## 2022-05-19 DIAGNOSIS — F332 Major depressive disorder, recurrent severe without psychotic features: Secondary | ICD-10-CM | POA: Diagnosis not present

## 2022-05-19 DIAGNOSIS — F603 Borderline personality disorder: Secondary | ICD-10-CM | POA: Diagnosis not present

## 2022-05-19 DIAGNOSIS — F411 Generalized anxiety disorder: Secondary | ICD-10-CM | POA: Diagnosis not present

## 2022-06-12 ENCOUNTER — Other Ambulatory Visit: Payer: Self-pay | Admitting: Internal Medicine

## 2022-06-12 DIAGNOSIS — I10 Essential (primary) hypertension: Secondary | ICD-10-CM

## 2022-06-12 NOTE — Telephone Encounter (Signed)
Medication Refill - Medication: bisoprolol-hydrochlorothiazide (ZIAC) 10-6.25 MG tablet   Has the patient contacted their pharmacy? No. Pt reports that her bottle says authorization needed and they are just going to ask her to call the office  Preferred Pharmacy (with phone number or street name):  [facility], [place] - [address] Phone: [phone]  Fax: [phone]     Has the patient been seen for an appointment in the last year OR does the patient have an upcoming appointment? Yes.    Agent: Please be advised that RX refills may take up to 3 business days. We ask that you follow-up with your pharmacy.

## 2022-06-13 MED ORDER — BISOPROLOL-HYDROCHLOROTHIAZIDE 10-6.25 MG PO TABS: 1.0000 | ORAL_TABLET | Freq: Every day | ORAL | 0 refills | Status: DC

## 2022-06-13 NOTE — Telephone Encounter (Signed)
Courtesy refill. Called patient to schedule appt for medication refills. Future visit in 1 week.  Requested Prescriptions  Pending Prescriptions Disp Refills   bisoprolol-hydrochlorothiazide (ZIAC) 10-6.25 MG tablet 90 tablet 0    Sig: Take 1 tablet by mouth daily.     Cardiovascular: Beta Blocker + Diuretic Combos Failed - 06/12/2022  4:59 PM      Failed - K in normal range and within 180 days    Potassium  Date Value Ref Range Status  10/27/2021 4.5 3.5 - 5.3 mmol/L Final         Failed - Na in normal range and within 180 days    Sodium  Date Value Ref Range Status  10/27/2021 141 135 - 146 mmol/L Final         Failed - Cr in normal range and within 180 days    Creat  Date Value Ref Range Status  10/27/2021 1.16 (H) 0.60 - 1.00 mg/dL Final         Failed - eGFR in normal range and within 180 days    GFR, Est African American  Date Value Ref Range Status  11/12/2019 74 > OR = 60 mL/min/1.20m Final   GFR, Est Non African American  Date Value Ref Range Status  11/12/2019 64 > OR = 60 mL/min/1.771mFinal   eGFR  Date Value Ref Range Status  10/27/2021 50 (L) > OR = 60 mL/min/1.7375minal    Comment:    The eGFR is based on the CKD-EPI 2021 equation. To calculate  the new eGFR from a previous Creatinine or Cystatin C result, go to https://www.kidney.org/professionals/ kdoqi/gfr%5Fcalculator          Failed - Valid encounter within last 6 months    Recent Outpatient Visits           7 months ago [facility]   7 months ago Essential hypertension   [facility]   7 months ago [facility]   [facility]   10 months ago Acute low back pain, unspecified back pain laterality, unspecified whether sciatica present   [facility]   1 year ago Leg cramps    [facility]       Future Appointments             In 1 week [facility]    BP Readings from Last 1 Encounters:  11/14/21 130/78         Passed - Last Heart Rate in normal range    Pulse Readings from Last 1 Encounters:  11/14/21 77

## 2022-06-22 NOTE — Progress Notes (Deleted)
Established Patient Office Visit  Subjective:  Patient ID: Katherine Gould, female    DOB: [birth year]  Age: 72 y.o. MRN: [id number]  CC:  No chief complaint on file.   HPI Katherine Gould presents for follow up.  Hypertension/OSA: -Medications: Bisoprolol-HCTZ 10-6.25 mg -Patient is compliant with above medications and reports no side effects. -Checking BP at home (average): doesn't check -Denies any SOB, CP, vision changes, LE edema or symptoms of hypotension  HLD: -Medications: Nothing currently, given statins in the past but doesn't want to take it yet. No history of intolerance.  -Last lipid panel: Lipid Panel     Component Value Date/Time   CHOL 204 (H) 10/27/2021 1351   TRIG 152 (H) 10/27/2021 1351   HDL 55 10/27/2021 1351   CHOLHDL 3.7 10/27/2021 1351   LDLCALC 122 (H) 10/27/2021 1351   MDD: -Mood status: worse -Current treatment: Effexor 75, Klonopin as needed - following with [facility], working on tapering down on dose.  -Satisfied with current treatment?: yes -Symptom severity: severe  -Duration of current treatment : chronic -Side effects: no Medication compliance: excellent compliance Psychotherapy/counseling: yes current - seeing therapist every Thursday, seeing later today.     11/14/2021    3:52 PM 10/27/2021    1:04 PM 10/20/2021   11:34 AM 07/26/2021    8:16 AM 03/03/2021    2:16 PM  Depression screen PHQ 2/9  Decreased Interest 3 3 0 0 3  Down, Depressed, Hopeless 3 3 0 0 3  PHQ - 2 Score 6 6 0 0 6  Altered sleeping 3 3  0 3  Tired, decreased energy 3 3  0 3  Change in appetite 3 3  0 3  Feeling bad or failure about yourself  2 2  0 3  Trouble concentrating 3 3  0 3  Moving slowly or fidgety/restless 3 3  0 3  Suicidal thoughts 2 2  0 2  PHQ-9 Score 25 25  0 26  Difficult doing work/chores Very difficult Somewhat difficult  Not difficult at all Very difficult   Prediabetes: -Last A1c 4/21 5.8%  CKD: -Last creatinine 0.98  6/22  Interstitial Cystitis: -Had been having bladder discomfort for about 1 month, urine culture here last week negative for acute infection, showing mixed growth. Today denies dysuria, hematuria. Had been going more frequently. Had been seeing Urology in the past but hasn't had to go in the last 3 years.   GERD: -Currently on Pepcid 20 as needed once or twice a week  -Symptoms include gas, bloating and abdominal discomfort   Health Maintenance: -Blood work UTD -Mammogram/DEXA due, ordered   Past Medical History:  Diagnosis Date   Cervical polyp    Chronic kidney disease    CKD (chronic kidney disease)    Dysthymic disorder    Esophageal reflux    Hyperlipidemia    Hypertension    Interstitial cystitis    Obsessive-compulsive disorders    Other abnormal glucose    Other abnormal glucose    Other B-complex deficiencies     Past Surgical History:  Procedure Laterality Date   LASER ABLATION CONDYLOMA CERVICAL / VULVAR      Family History  Problem Relation Age of Onset   Heart failure Mother    Aortic stenosis Mother    Hypertension Mother    Stroke Mother    Glaucoma Mother    Heart attack Father    Hypertension Father    Heart attack Paternal Grandfather  Glaucoma Sister    Hypertension Sister    Atrial fibrillation Sister    Heart failure Sister    Anxiety disorder Sister     Social History   Socioeconomic History   Marital status: Divorced    Spouse name: Not on file   Number of children: 1   Years of education: 12   Highest education level: High school graduate  Occupational History   Occupation: disability  Tobacco Use   Smoking status: Never   Smokeless tobacco: Never  Vaping Use   Vaping Use: Never used  Substance and Sexual Activity   Alcohol use: No   Drug use: No   Sexual activity: Not Currently  Other Topics Concern   Not on file  Social History Narrative   Pt lives alone.    Social Determinants of Health   Financial Resource  Strain: Low Risk  (04/24/2019)   Overall Financial Resource Strain (CARDIA)    Difficulty of Paying Living Expenses: Not hard at all  Food Insecurity: No Food Insecurity (04/24/2019)   Hunger Vital Sign    Worried About Running Out of Food in the Last Year: Never true    Ran Out of Food in the Last Year: Never true  Transportation Needs: No Transportation Needs (04/24/2019)   PRAPARE - Hydrologist (Medical): No    Lack of Transportation (Non-Medical): No  Physical Activity: Inactive (04/24/2019)   Exercise Vital Sign    Days of Exercise per Week: 0 days    Minutes of Exercise per Session: 0 min  Stress: Stress Concern Present (04/24/2019)   [place]    Feeling of Stress : Very much  Social Connections: Socially Isolated (04/24/2019)   Social Connection and Isolation Panel [NHANES]    Frequency of Communication with Friends and Family: Once a week    Frequency of Social Gatherings with Friends and Family: Once a week    Attends Religious Services: Never    Marine scientist or Organizations: No    Attends Archivist Meetings: Never    Marital Status: Divorced  Human resources officer Violence: Not At Risk (04/24/2019)   Humiliation, Afraid, Rape, and Kick questionnaire    Fear of Current or Ex-Partner: No    Emotionally Abused: No    Physically Abused: No    Sexually Abused: No    Outpatient Medications Prior to Visit  Medication Sig Dispense Refill   bisoprolol-hydrochlorothiazide (ZIAC) 10-6.25 MG tablet Take 1 tablet by mouth daily. 90 tablet 0   Cholecalciferol (VITAMIN D3) 2000 UNITS capsule Take 2,000 Units by mouth daily. Pt taking 4000 IU daily     clonazePAM (KLONOPIN) 1 MG tablet Take 1 mg by mouth 2 (two) times daily.     famotidine (PEPCID) 20 MG tablet Take 20 mg by mouth 2 (two) times daily.     Magnesium 200 MG TABS Take 1 tablet by mouth daily.     silver sulfADIAZINE  (SILVADENE) 1 % cream Apply topically 2 (two) times daily. 50 g 0   Turmeric 500 MG CAPS Take 1,000 mg by mouth.      venlafaxine XR (EFFEXOR-XR) 75 MG 24 hr capsule Take 75 mg by mouth daily.     No facility-administered medications prior to visit.    Allergies  Allergen Reactions   Aripiprazole Other (See Comments)    Incubus Dream tremmors    Quetiapine Other (See Comments)    Incubus  dream   Bactrim [Sulfamethoxazole-Trimethoprim] Itching    ROS Review of Systems  Constitutional:  Negative for chills and fever.  Eyes:  Negative for visual disturbance.  Respiratory:  Negative for cough and shortness of breath.   Cardiovascular:  Negative for chest pain.  Gastrointestinal:  Negative for abdominal pain.  Genitourinary:  Positive for frequency and urgency. Negative for dysuria and hematuria.  Skin:  Positive for color change.  Neurological:  Negative for dizziness and headaches.      Objective:    Physical Exam Constitutional:      Appearance: Normal appearance.  HENT:     Head: Normocephalic and atraumatic.  Eyes:     Conjunctiva/sclera: Conjunctivae normal.  Cardiovascular:     Rate and Rhythm: Normal rate and regular rhythm.  Pulmonary:     Effort: Pulmonary effort is normal.     Breath sounds: Normal breath sounds.  Musculoskeletal:     Right lower leg: No edema.     Left lower leg: No edema.  Skin:    General: Skin is warm and dry.     Comments: Scars on legs from self-picking, 2 acute lesions with mild erythema, no drainage or signs of infection   Neurological:     General: No focal deficit present.     Mental Status: She is alert. Mental status is at baseline.  Psychiatric:        Mood and Affect: Mood normal.        Behavior: Behavior normal.     There were no vitals taken for this visit. Wt Readings from Last 3 Encounters:  11/14/21 214 lb 12.8 oz (97.4 kg)  10/27/21 217 lb 14.4 oz (98.8 kg)  07/26/21 217 lb 6.4 oz (98.6 kg)     Health  Maintenance Due  Topic Date Due   DEXA SCAN  Never done   MAMMOGRAM  Never done   DTaP/Tdap/Td (1 - Tdap) Never done   Zoster Vaccines- Shingrix (1 of 2) Never done   Medicare Annual Wellness (AWV)  04/23/2020   INFLUENZA VACCINE  11/15/2021   COVID-19 Vaccine (4 - 2023-24 season) 12/16/2021    There are no preventive care reminders to display for this patient.  Lab Results  Component Value Date   TSH 1.26 10/27/2021   Lab Results  Component Value Date   WBC 5.8 10/27/2021   HGB 14.5 10/27/2021   HCT 44.0 10/27/2021   MCV 93.0 10/27/2021   PLT 257 10/27/2021   Lab Results  Component Value Date   NA 141 10/27/2021   K 4.5 10/27/2021   CO2 29 10/27/2021   GLUCOSE 103 (H) 10/27/2021   BUN 14 10/27/2021   CREATININE 1.16 (H) 10/27/2021   BILITOT 0.5 10/27/2021   AST 15 10/27/2021   ALT 11 10/27/2021   PROT 6.7 10/27/2021   CALCIUM 9.6 10/27/2021   EGFR 50 (L) 10/27/2021   Lab Results  Component Value Date   CHOL 204 (H) 10/27/2021   Lab Results  Component Value Date   HDL 55 10/27/2021   Lab Results  Component Value Date   LDLCALC 122 (H) 10/27/2021   Lab Results  Component Value Date   TRIG 152 (H) 10/27/2021   Lab Results  Component Value Date   CHOLHDL 3.7 10/27/2021   Lab Results  Component Value Date   HGBA1C 5.6 10/27/2021      Assessment & Plan:   1. Essential hypertension: Stable.  Blood pressure at goal today.  Continue bisoprolol-HCTZ 10-6.25  mg daily.  Refills ordered today.  Due for routine labs, CBC and CMP today.  - CBC w/Diff/Platelet - COMPLETE METABOLIC PANEL WITH GFR - bisoprolol-hydrochlorothiazide (ZIAC) 10-6.25 MG tablet; Take 1 tablet by mouth daily.  Dispense: 90 tablet; Refill: 1  2. Familial hyperlipidemia: Stable, not currently being treated as the patient does not want to take statins.  Recheck lipid panel today, based on results can discuss something like Repatha.  - Lipid Profile  3. Severe episode of recurrent  major depressive disorder, without psychotic features ([place]): Exacerbated today.  Following up with therapist later today.  Does have a psychiatrist who is working on decreasing her Effexor to switch her to a different SSRI.  4. Prediabetes: Recheck A1c today.  - HgB A1c  5. Stage 3a chronic kidney disease ([place]): Recheck kidney function today.   6. Interstitial cystitis: No UTI symptoms today, last urine culture from last week negative.  She will let me know if her symptoms continue to worsen and a referral to urology can be placed.  In the meantime avoid caffeine and carbonated beverages that can irritate the bladder.  7. Gastroesophageal reflux disease without esophagitis: Stable.  Continue Pepcid 20 mg as needed.  8. Vitamin D deficiency/Hypomagnesemia/Chronic fatigue: TSH, vitamin D levels and magnesium levels with above labs.  - Vitamin D (25 hydroxy) - Magnesium - TSH   Follow-up: No follow-ups on file.    Teodora Medici, DO

## 2022-06-23 ENCOUNTER — Ambulatory Visit: Payer: Medicare Other | Admitting: Internal Medicine

## 2022-07-12 ENCOUNTER — Other Ambulatory Visit: Payer: Self-pay | Admitting: Internal Medicine

## 2022-07-12 DIAGNOSIS — I10 Essential (primary) hypertension: Secondary | ICD-10-CM

## 2022-07-12 NOTE — Telephone Encounter (Signed)
Called patient to schedule future appt for medication refills. No answer, LVMTCB [phone].

## 2022-07-12 NOTE — Telephone Encounter (Signed)
Requested medication (s) are due for refill today: no last refill 06/13/22  Requested medication (s) are on the active medication list: yes   Last refill:  06/13/22 #90 0 refills   Future visit scheduled: no   Notes to clinic:  protocol failed. Last labs 10/27/21. Overdue appt. Called patient to schedule future visit. No valid encounter in 8 months. Last refill 06/13/22. Do you want to refill Rx? Or give a courtesy refill?     Requested Prescriptions  Pending Prescriptions Disp Refills   bisoprolol-hydrochlorothiazide (ZIAC) 10-6.25 MG tablet [Pharmacy Med Name: BISOPROLOL/HCTZ 10MG /6.25MG  TABS] 90 tablet 0    Sig: Take 1 tablet by mouth daily.     Cardiovascular: Beta Blocker + Diuretic Combos Failed - 07/12/2022  6:59 AM      Failed - K in normal range and within 180 days    Potassium  Date Value Ref Range Status  10/27/2021 4.5 3.5 - 5.3 mmol/L Final         Failed - Na in normal range and within 180 days    Sodium  Date Value Ref Range Status  10/27/2021 141 135 - 146 mmol/L Final         Failed - Cr in normal range and within 180 days    Creat  Date Value Ref Range Status  10/27/2021 1.16 (H) 0.60 - 1.00 mg/dL Final         Failed - eGFR in normal range and within 180 days    GFR, Est African American  Date Value Ref Range Status  11/12/2019 74 > OR = 60 mL/min/1.76m2 Final   GFR, Est Non African American  Date Value Ref Range Status  11/12/2019 64 > OR = 60 mL/min/1.46m2 Final   eGFR  Date Value Ref Range Status  10/27/2021 50 (L) > OR = 60 mL/min/1.22m2 Final    Comment:    The eGFR is based on the CKD-EPI 2021 equation. To calculate  the new eGFR from a previous Creatinine or Cystatin C result, go to https://www.kidney.org/professionals/ kdoqi/gfr%5Fcalculator          Failed - Valid encounter within last 6 months    Recent Outpatient Visits           8 months ago Urinary frequency   [facility] Bo Merino, FNP   8  months ago Essential hypertension   [place], DO   8 months ago Urinary frequency   [facility] Bo Merino, FNP   11 months ago Acute low back pain, unspecified back pain laterality, unspecified whether sciatica present   [facility] Teodora Medici, DO   1 year ago Leg cramps   [facility] Kathrine Haddock, NP              Passed - Last BP in normal range    BP Readings from Last 1 Encounters:  11/14/21 130/78         Passed - Last Heart Rate in normal range    Pulse Readings from Last 1 Encounters:  11/14/21 77

## 2022-07-20 NOTE — Progress Notes (Deleted)
Established Patient Office Visit  Subjective:  Patient ID: Katherine Gould, female    DOB: [birth year]  Age: 72 y.o. MRN: [id number]  CC:  No chief complaint on file.   HPI Katherine Gould presents for follow up.  Hypertension/OSA: -Medications: Bisoprolol-HCTZ 10-6.25 mg -Patient is compliant with above medications and reports no side effects. -Checking BP at home (average): doesn't check -Denies any SOB, CP, vision changes, LE edema or symptoms of hypotension  HLD: -Medications: Nothing currently, given statins in the past but doesn't want to take it yet. No history of intolerance.  -Last lipid panel: Lipid Panel     Component Value Date/Time   CHOL 204 (H) 10/27/2021 1351   TRIG 152 (H) 10/27/2021 1351   HDL 55 10/27/2021 1351   CHOLHDL 3.7 10/27/2021 1351   LDLCALC 122 (H) 10/27/2021 1351   MDD: -Mood status: worse -Current treatment: Effexor 75, Klonopin as needed - following with [facility], working on tapering down on dose.  -Satisfied with current treatment?: yes -Symptom severity: severe  -Duration of current treatment : chronic -Side effects: no Medication compliance: excellent compliance Psychotherapy/counseling: yes current - seeing therapist every Thursday, seeing later today.     11/14/2021    3:52 PM 10/27/2021    1:04 PM 10/20/2021   11:34 AM 07/26/2021    8:16 AM 03/03/2021    2:16 PM  Depression screen PHQ 2/9  Decreased Interest 3 3 0 0 3  Down, Depressed, Hopeless 3 3 0 0 3  PHQ - 2 Score 6 6 0 0 6  Altered sleeping 3 3  0 3  Tired, decreased energy 3 3  0 3  Change in appetite 3 3  0 3  Feeling bad or failure about yourself  2 2  0 3  Trouble concentrating 3 3  0 3  Moving slowly or fidgety/restless 3 3  0 3  Suicidal thoughts 2 2  0 2  PHQ-9 Score 25 25  0 26  Difficult doing work/chores Very difficult Somewhat difficult  Not difficult at all Very difficult   Prediabetes: -Last A1c 4/21 5.8%  CKD: -Last creatinine 0.98  6/22  Interstitial Cystitis: -Had been having bladder discomfort for about 1 month, urine culture here last week negative for acute infection, showing mixed growth. Today denies dysuria, hematuria. Had been going more frequently. Had been seeing Urology in the past but hasn't had to go in the last 3 years.   GERD: -Currently on Pepcid 20 as needed once or twice a week  -Symptoms include gas, bloating and abdominal discomfort   Health Maintenance: -Blood work UTD -Mammogram/DEXA due, ordered   Past Medical History:  Diagnosis Date   Cervical polyp    Chronic kidney disease    CKD (chronic kidney disease)    Dysthymic disorder    Esophageal reflux    Hyperlipidemia    Hypertension    Interstitial cystitis    Obsessive-compulsive disorders    Other abnormal glucose    Other abnormal glucose    Other B-complex deficiencies     Past Surgical History:  Procedure Laterality Date   LASER ABLATION CONDYLOMA CERVICAL / VULVAR      Family History  Problem Relation Age of Onset   Heart failure Mother    Aortic stenosis Mother    Hypertension Mother    Stroke Mother    Glaucoma Mother    Heart attack Father    Hypertension Father    Heart attack Paternal Grandfather  Glaucoma Sister    Hypertension Sister    Atrial fibrillation Sister    Heart failure Sister    Anxiety disorder Sister     Social History   Socioeconomic History   Marital status: Divorced    Spouse name: Not on file   Number of children: 1   Years of education: 12   Highest education level: High school graduate  Occupational History   Occupation: disability  Tobacco Use   Smoking status: Never   Smokeless tobacco: Never  Vaping Use   Vaping Use: Never used  Substance and Sexual Activity   Alcohol use: No   Drug use: No   Sexual activity: Not Currently  Other Topics Concern   Not on file  Social History Narrative   Pt lives alone.    Social Determinants of Health   Financial Resource  Strain: Low Risk  (04/24/2019)   Overall Financial Resource Strain (CARDIA)    Difficulty of Paying Living Expenses: Not hard at all  Food Insecurity: No Food Insecurity (04/24/2019)   Hunger Vital Sign    Worried About Running Out of Food in the Last Year: Never true    Ran Out of Food in the Last Year: Never true  Transportation Needs: No Transportation Needs (04/24/2019)   PRAPARE - Hydrologist (Medical): No    Lack of Transportation (Non-Medical): No  Physical Activity: Inactive (04/24/2019)   Exercise Vital Sign    Days of Exercise per Week: 0 days    Minutes of Exercise per Session: 0 min  Stress: Stress Concern Present (04/24/2019)   [place]    Feeling of Stress : Very much  Social Connections: Socially Isolated (04/24/2019)   Social Connection and Isolation Panel [NHANES]    Frequency of Communication with Friends and Family: Once a week    Frequency of Social Gatherings with Friends and Family: Once a week    Attends Religious Services: Never    Marine scientist or Organizations: No    Attends Archivist Meetings: Never    Marital Status: Divorced  Human resources officer Violence: Not At Risk (04/24/2019)   Humiliation, Afraid, Rape, and Kick questionnaire    Fear of Current or Ex-Partner: No    Emotionally Abused: No    Physically Abused: No    Sexually Abused: No    Outpatient Medications Prior to Visit  Medication Sig Dispense Refill   bisoprolol-hydrochlorothiazide (ZIAC) 10-6.25 MG tablet Take 1 tablet by mouth daily. 90 tablet 0   Cholecalciferol (VITAMIN D3) 2000 UNITS capsule Take 2,000 Units by mouth daily. Pt taking 4000 IU daily     clonazePAM (KLONOPIN) 1 MG tablet Take 1 mg by mouth 2 (two) times daily.     famotidine (PEPCID) 20 MG tablet Take 20 mg by mouth 2 (two) times daily.     Magnesium 200 MG TABS Take 1 tablet by mouth daily.     silver sulfADIAZINE  (SILVADENE) 1 % cream Apply topically 2 (two) times daily. 50 g 0   Turmeric 500 MG CAPS Take 1,000 mg by mouth.      venlafaxine XR (EFFEXOR-XR) 75 MG 24 hr capsule Take 75 mg by mouth daily.     No facility-administered medications prior to visit.    Allergies  Allergen Reactions   Aripiprazole Other (See Comments)    Incubus Dream tremmors    Quetiapine Other (See Comments)    Incubus  dream   Bactrim [Sulfamethoxazole-Trimethoprim] Itching    ROS Review of Systems  Constitutional:  Negative for chills and fever.  Eyes:  Negative for visual disturbance.  Respiratory:  Negative for cough and shortness of breath.   Cardiovascular:  Negative for chest pain.  Gastrointestinal:  Negative for abdominal pain.  Genitourinary:  Positive for frequency and urgency. Negative for dysuria and hematuria.  Skin:  Positive for color change.  Neurological:  Negative for dizziness and headaches.      Objective:    Physical Exam Constitutional:      Appearance: Normal appearance.  HENT:     Head: Normocephalic and atraumatic.  Eyes:     Conjunctiva/sclera: Conjunctivae normal.  Cardiovascular:     Rate and Rhythm: Normal rate and regular rhythm.  Pulmonary:     Effort: Pulmonary effort is normal.     Breath sounds: Normal breath sounds.  Musculoskeletal:     Right lower leg: No edema.     Left lower leg: No edema.  Skin:    General: Skin is warm and dry.     Comments: Scars on legs from self-picking, 2 acute lesions with mild erythema, no drainage or signs of infection   Neurological:     General: No focal deficit present.     Mental Status: She is alert. Mental status is at baseline.  Psychiatric:        Mood and Affect: Mood normal.        Behavior: Behavior normal.     There were no vitals taken for this visit. Wt Readings from Last 3 Encounters:  11/14/21 214 lb 12.8 oz (97.4 kg)  10/27/21 217 lb 14.4 oz (98.8 kg)  07/26/21 217 lb 6.4 oz (98.6 kg)     Health  Maintenance Due  Topic Date Due   DEXA SCAN  Never done   MAMMOGRAM  Never done   DTaP/Tdap/Td (1 - Tdap) Never done   Zoster Vaccines- Shingrix (1 of 2) Never done   Medicare Annual Wellness (AWV)  04/23/2020   COVID-19 Vaccine (4 - 2023-24 season) 12/16/2021    There are no preventive care reminders to display for this patient.  Lab Results  Component Value Date   TSH 1.26 10/27/2021   Lab Results  Component Value Date   WBC 5.8 10/27/2021   HGB 14.5 10/27/2021   HCT 44.0 10/27/2021   MCV 93.0 10/27/2021   PLT 257 10/27/2021   Lab Results  Component Value Date   NA 141 10/27/2021   K 4.5 10/27/2021   CO2 29 10/27/2021   GLUCOSE 103 (H) 10/27/2021   BUN 14 10/27/2021   CREATININE 1.16 (H) 10/27/2021   BILITOT 0.5 10/27/2021   AST 15 10/27/2021   ALT 11 10/27/2021   PROT 6.7 10/27/2021   CALCIUM 9.6 10/27/2021   EGFR 50 (L) 10/27/2021   Lab Results  Component Value Date   CHOL 204 (H) 10/27/2021   Lab Results  Component Value Date   HDL 55 10/27/2021   Lab Results  Component Value Date   LDLCALC 122 (H) 10/27/2021   Lab Results  Component Value Date   TRIG 152 (H) 10/27/2021   Lab Results  Component Value Date   CHOLHDL 3.7 10/27/2021   Lab Results  Component Value Date   HGBA1C 5.6 10/27/2021      Assessment & Plan:   1. Essential hypertension: Stable.  Blood pressure at goal today.  Continue bisoprolol-HCTZ 10-6.25 mg daily.  Refills ordered today.  Due for routine labs, CBC and CMP today.  - CBC w/Diff/Platelet - COMPLETE METABOLIC PANEL WITH GFR - bisoprolol-hydrochlorothiazide (ZIAC) 10-6.25 MG tablet; Take 1 tablet by mouth daily.  Dispense: 90 tablet; Refill: 1  2. Familial hyperlipidemia: Stable, not currently being treated as the patient does not want to take statins.  Recheck lipid panel today, based on results can discuss something like Repatha.  - Lipid Profile  3. Severe episode of recurrent major depressive disorder, without  psychotic features ([place]): Exacerbated today.  Following up with therapist later today.  Does have a psychiatrist who is working on decreasing her Effexor to switch her to a different SSRI.  4. Prediabetes: Recheck A1c today.  - HgB A1c  5. Stage 3a chronic kidney disease ([place]): Recheck kidney function today.   6. Interstitial cystitis: No UTI symptoms today, last urine culture from last week negative.  She will let me know if her symptoms continue to worsen and a referral to urology can be placed.  In the meantime avoid caffeine and carbonated beverages that can irritate the bladder.  7. Gastroesophageal reflux disease without esophagitis: Stable.  Continue Pepcid 20 mg as needed.  8. Vitamin D deficiency/Hypomagnesemia/Chronic fatigue: TSH, vitamin D levels and magnesium levels with above labs.  - Vitamin D (25 hydroxy) - Magnesium - TSH   Follow-up: No follow-ups on file.    Teodora Medici, DO

## 2022-07-21 ENCOUNTER — Ambulatory Visit: Payer: Medicare Other | Admitting: Internal Medicine

## 2022-07-27 NOTE — Progress Notes (Deleted)
 Established Patient Office Visit  Subjective:  Patient ID: Anaeli K Folkes, female    DOB: 07/08/1950  Age: 72 y.o. MRN: [id number]  CC:  No chief complaint on file.   HPI Jodye K Menees presents for follow up.  Hypertension/OSA: -Medications: Bisoprolol-HCTZ 10-6.25 mg -Patient is compliant with above medications and reports no side effects. -Checking BP at home (average): doesn't check -Denies any SOB, CP, vision changes, LE edema or symptoms of hypotension  HLD: -Medications: Nothing currently, given statins in the past but doesn't want to take it yet. No history of intolerance.  -Last lipid panel: Lipid Panel     Component Value Date/Time   CHOL 204 (H) 10/27/2021 1351   TRIG 152 (H) 10/27/2021 1351   HDL 55 10/27/2021 1351   CHOLHDL 3.7 10/27/2021 1351   LDLCALC 122 (H) 10/27/2021 1351   MDD: -Mood status: worse -Current treatment: Effexor 75, Klonopin as needed - following with [facility], working on tapering down on dose.  -Satisfied with current treatment?: yes -Symptom severity: severe  -Duration of current treatment : chronic -Side effects: no Medication compliance: excellent compliance Psychotherapy/counseling: yes current - seeing therapist every Thursday, seeing later today.     11/14/2021    3:52 PM 10/27/2021    1:04 PM 10/20/2021   11:34 AM 07/26/2021    8:16 AM 03/03/2021    2:16 PM  Depression screen PHQ 2/9  Decreased Interest 3 3 0 0 3  Down, Depressed, Hopeless 3 3 0 0 3  PHQ - 2 Score 6 6 0 0 6  Altered sleeping 3 3  0 3  Tired, decreased energy 3 3  0 3  Change in appetite 3 3  0 3  Feeling bad or failure about yourself  2 2  0 3  Trouble concentrating 3 3  0 3  Moving slowly or fidgety/restless 3 3  0 3  Suicidal thoughts 2 2  0 2  PHQ-9 Score 25 25  0 26  Difficult doing work/chores Very difficult Somewhat difficult  Not difficult at all Very difficult   Prediabetes: -Last A1c 4/21 5.8%  CKD: -Last creatinine 0.98  6/22  Interstitial Cystitis: -Had been having bladder discomfort for about 1 month, urine culture here last week negative for acute infection, showing mixed growth. Today denies dysuria, hematuria. Had been going more frequently. Had been seeing Urology in the past but hasn't had to go in the last 3 years.   GERD: -Currently on Pepcid 20 as needed once or twice a week  -Symptoms include gas, bloating and abdominal discomfort   Health Maintenance: -Blood work UTD -Mammogram/DEXA due, ordered   Past Medical History:  Diagnosis Date   Cervical polyp    Chronic kidney disease    CKD (chronic kidney disease)    Dysthymic disorder    Esophageal reflux    Hyperlipidemia    Hypertension    Interstitial cystitis    Obsessive-compulsive disorders    Other abnormal glucose    Other abnormal glucose    Other B-complex deficiencies     Past Surgical History:  Procedure Laterality Date   LASER ABLATION CONDYLOMA CERVICAL / VULVAR      Family History  Problem Relation Age of Onset   Heart failure Mother    Aortic stenosis Mother    Hypertension Mother    Stroke Mother    Glaucoma Mother    Heart attack Father    Hypertension Father    Heart attack Paternal Grandfather      Glaucoma Sister    Hypertension Sister    Atrial fibrillation Sister    Heart failure Sister    Anxiety disorder Sister     Social History   Socioeconomic History   Marital status: Divorced    Spouse name: Not on file   Number of children: 1   Years of education: 12   Highest education level: High school graduate  Occupational History   Occupation: disability  Tobacco Use   Smoking status: Never   Smokeless tobacco: Never  Vaping Use   Vaping Use: Never used  Substance and Sexual Activity   Alcohol use: No   Drug use: No   Sexual activity: Not Currently  Other Topics Concern   Not on file  Social History Narrative   Pt lives alone.    Social Determinants of Health   Financial Resource  Strain: Low Risk  (04/24/2019)   Overall Financial Resource Strain (CARDIA)    Difficulty of Paying Living Expenses: Not hard at all  Food Insecurity: No Food Insecurity (04/24/2019)   Hunger Vital Sign    Worried About Running Out of Food in the Last Year: Never true    Ran Out of Food in the Last Year: Never true  Transportation Needs: No Transportation Needs (04/24/2019)   PRAPARE - Transportation    Lack of Transportation (Medical): No    Lack of Transportation (Non-Medical): No  Physical Activity: Inactive (04/24/2019)   Exercise Vital Sign    Days of Exercise per Week: 0 days    Minutes of Exercise per Session: 0 min  Stress: Stress Concern Present (04/24/2019)   [employer] - Occupational Stress Questionnaire    Feeling of Stress : Very much  Social Connections: Socially Isolated (04/24/2019)   Social Connection and Isolation Panel [NHANES]    Frequency of Communication with Friends and Family: Once a week    Frequency of Social Gatherings with Friends and Family: Once a week    Attends Religious Services: Never    Active Member of Clubs or Organizations: No    Attends Club or Organization Meetings: Never    Marital Status: Divorced  Intimate Partner Violence: Not At Risk (04/24/2019)   Humiliation, Afraid, Rape, and Kick questionnaire    Fear of Current or Ex-Partner: No    Emotionally Abused: No    Physically Abused: No    Sexually Abused: No    Outpatient Medications Prior to Visit  Medication Sig Dispense Refill   bisoprolol-hydrochlorothiazide (ZIAC) 10-6.25 MG tablet Take 1 tablet by mouth daily. 90 tablet 0   Cholecalciferol (VITAMIN D3) 2000 UNITS capsule Take 2,000 Units by mouth daily. Pt taking 4000 IU daily     clonazePAM (KLONOPIN) 1 MG tablet Take 1 mg by mouth 2 (two) times daily.     famotidine (PEPCID) 20 MG tablet Take 20 mg by mouth 2 (two) times daily.     Magnesium 200 MG TABS Take 1 tablet by mouth daily.     silver sulfADIAZINE  (SILVADENE) 1 % cream Apply topically 2 (two) times daily. 50 g 0   Turmeric 500 MG CAPS Take 1,000 mg by mouth.      venlafaxine XR (EFFEXOR-XR) 75 MG 24 hr capsule Take 75 mg by mouth daily.     No facility-administered medications prior to visit.    Allergies  Allergen Reactions   Aripiprazole Other (See Comments)    Incubus Dream tremmors    Quetiapine Other (See Comments)    Incubus   dream   Bactrim [Sulfamethoxazole-Trimethoprim] Itching    ROS Review of Systems  Constitutional:  Negative for chills and fever.  Eyes:  Negative for visual disturbance.  Respiratory:  Negative for cough and shortness of breath.   Cardiovascular:  Negative for chest pain.  Gastrointestinal:  Negative for abdominal pain.  Genitourinary:  Positive for frequency and urgency. Negative for dysuria and hematuria.  Skin:  Positive for color change.  Neurological:  Negative for dizziness and headaches.      Objective:    Physical Exam Constitutional:      Appearance: Normal appearance.  HENT:     Head: Normocephalic and atraumatic.  Eyes:     Conjunctiva/sclera: Conjunctivae normal.  Cardiovascular:     Rate and Rhythm: Normal rate and regular rhythm.  Pulmonary:     Effort: Pulmonary effort is normal.     Breath sounds: Normal breath sounds.  Musculoskeletal:     Right lower leg: No edema.     Left lower leg: No edema.  Skin:    General: Skin is warm and dry.     Comments: Scars on legs from self-picking, 2 acute lesions with mild erythema, no drainage or signs of infection   Neurological:     General: No focal deficit present.     Mental Status: She is alert. Mental status is at baseline.  Psychiatric:        Mood and Affect: Mood normal.        Behavior: Behavior normal.     There were no vitals taken for this visit. Wt Readings from Last 3 Encounters:  11/14/21 214 lb 12.8 oz (97.4 kg)  10/27/21 217 lb 14.4 oz (98.8 kg)  07/26/21 217 lb 6.4 oz (98.6 kg)     Health  Maintenance Due  Topic Date Due   DEXA SCAN  Never done   MAMMOGRAM  Never done   DTaP/Tdap/Td (1 - Tdap) Never done   Zoster Vaccines- Shingrix (1 of 2) Never done   Medicare Annual Wellness (AWV)  04/23/2020   COVID-19 Vaccine (4 - 2023-24 season) 12/16/2021    There are no preventive care reminders to display for this patient.  Lab Results  Component Value Date   TSH 1.26 10/27/2021   Lab Results  Component Value Date   WBC 5.8 10/27/2021   HGB 14.5 10/27/2021   HCT 44.0 10/27/2021   MCV 93.0 10/27/2021   PLT 257 10/27/2021   Lab Results  Component Value Date   NA 141 10/27/2021   K 4.5 10/27/2021   CO2 29 10/27/2021   GLUCOSE 103 (H) 10/27/2021   BUN 14 10/27/2021   CREATININE 1.16 (H) 10/27/2021   BILITOT 0.5 10/27/2021   AST 15 10/27/2021   ALT 11 10/27/2021   PROT 6.7 10/27/2021   CALCIUM 9.6 10/27/2021   EGFR 50 (L) 10/27/2021   Lab Results  Component Value Date   CHOL 204 (H) 10/27/2021   Lab Results  Component Value Date   HDL 55 10/27/2021   Lab Results  Component Value Date   LDLCALC 122 (H) 10/27/2021   Lab Results  Component Value Date   TRIG 152 (H) 10/27/2021   Lab Results  Component Value Date   CHOLHDL 3.7 10/27/2021   Lab Results  Component Value Date   HGBA1C 5.6 10/27/2021      Assessment & Plan:   1. Essential hypertension: Stable.  Blood pressure at goal today.  Continue bisoprolol-HCTZ 10-6.25 mg daily.  Refills ordered today.    Due for routine labs, CBC and CMP today.  - CBC w/Diff/Platelet - COMPLETE METABOLIC PANEL WITH GFR - bisoprolol-hydrochlorothiazide (ZIAC) 10-6.25 MG tablet; Take 1 tablet by mouth daily.  Dispense: 90 tablet; Refill: 1  2. Familial hyperlipidemia: Stable, not currently being treated as the patient does not want to take statins.  Recheck lipid panel today, based on results can discuss something like Repatha.  - Lipid Profile  3. Severe episode of recurrent major depressive disorder, without  psychotic features ([facility]): Exacerbated today.  Following up with therapist later today.  Does have a psychiatrist who is working on decreasing her Effexor to switch her to a different SSRI.  4. Prediabetes: Recheck A1c today.  - HgB A1c  5. Stage 3a chronic kidney disease ([facility]): Recheck kidney function today.   6. Interstitial cystitis: No UTI symptoms today, last urine culture from last week negative.  She will let me know if her symptoms continue to worsen and a referral to urology can be placed.  In the meantime avoid caffeine and carbonated beverages that can irritate the bladder.  7. Gastroesophageal reflux disease without esophagitis: Stable.  Continue Pepcid 20 mg as needed.  8. Vitamin D deficiency/Hypomagnesemia/Chronic fatigue: TSH, vitamin D levels and magnesium levels with above labs.  - Vitamin D (25 hydroxy) - Magnesium - TSH   Follow-up: No follow-ups on file.    Ger Ringenberg, DO 

## 2022-07-28 ENCOUNTER — Ambulatory Visit: Payer: Medicare Other | Admitting: Internal Medicine

## 2022-08-10 NOTE — Progress Notes (Signed)
Established Patient Office Visit  Subjective:  Patient ID: Katherine Gould, female    DOB: [birth year]  Age: 72 y.o. MRN: [id number]  CC:  Chief Complaint  Patient presents with   Follow-up    HPI Katherine Gould presents for follow up.  Hypertension/OSA: -Medications: Bisoprolol-HCTZ 10-6.25 mg -Patient is compliant with above medications and reports no side effects. -Checking BP at home (average): doesn't check -Denies any SOB, CP, vision changes, LE edema or symptoms of hypotension  HLD: -Medications: Nothing currently, given statins in the past but doesn't want to take it yet. No history of intolerance.  -Last lipid panel: Lipid Panel     Component Value Date/Time   CHOL 204 (H) 10/27/2021 1351   TRIG 152 (H) 10/27/2021 1351   HDL 55 10/27/2021 1351   CHOLHDL 3.7 10/27/2021 1351   LDLCALC 122 (H) 10/27/2021 1351    MDD: -Mood status: stable -Current treatment: Effexor 75, Klonopin as needed - following with [facility], working on tapering down on dose.  -Satisfied with current treatment?: yes -Symptom severity: severe  -Duration of current treatment : chronic -Side effects: no Medication compliance: excellent compliance Psychotherapy/counseling: yes current - seeing therapist every Thursday.     08/11/2022   11:35 AM 11/14/2021    3:52 PM 10/27/2021    1:04 PM 10/20/2021   11:34 AM 07/26/2021    8:16 AM  Depression screen PHQ 2/9  Decreased Interest 3 3 3  0 0  Down, Depressed, Hopeless 3 3 3  0 0  PHQ - 2 Score 6 6 6  0 0  Altered sleeping 3 3 3   0  Tired, decreased energy 3 3 3   0  Change in appetite 3 3 3   0  Feeling bad or failure about yourself  3 2 2   0  Trouble concentrating 3 3 3   0  Moving slowly or fidgety/restless 3 3 3   0  Suicidal thoughts 0 2 2  0  PHQ-9 Score 24 25 25   0  Difficult doing work/chores Somewhat difficult Very difficult Somewhat difficult  Not difficult at all   Prediabetes: -Last A1c 4/21 5.8%, 5.6% 7/23 -Binge eats sugar  frequently, endorsing eating an entire chocolate cake recently  CKD: -Last creatinine 1.16 7/23, GFR 50  Interstitial Cystitis: -Following with Urology   GERD: -Currently on Pepcid 20 as needed once or twice a week  -Symptoms include gas, bloating and abdominal discomfort - symptoms uncontrolled.  Health Maintenance: -Blood work due -Mammogram/DEXA due, ordered   Past Medical History:  Diagnosis Date   Cervical polyp    Chronic kidney disease    CKD (chronic kidney disease)    Dysthymic disorder    Esophageal reflux    Hyperlipidemia    Hypertension    Interstitial cystitis    Obsessive-compulsive disorders    Other abnormal glucose    Other abnormal glucose    Other B-complex deficiencies     Past Surgical History:  Procedure Laterality Date   LASER ABLATION CONDYLOMA CERVICAL / VULVAR      Family History  Problem Relation Age of Onset   Heart failure Mother    Aortic stenosis Mother    Hypertension Mother    Stroke Mother    Glaucoma Mother    Heart attack Father    Hypertension Father    Heart attack Paternal Grandfather    Glaucoma Sister    Hypertension Sister    Atrial fibrillation Sister    Heart failure Sister  Anxiety disorder Sister     Social History   Socioeconomic History   Marital status: Divorced    Spouse name: Not on file   Number of children: 1   Years of education: 12   Highest education level: High school graduate  Occupational History   Occupation: disability  Tobacco Use   Smoking status: Never   Smokeless tobacco: Never  Vaping Use   Vaping Use: Never used  Substance and Sexual Activity   Alcohol use: No   Drug use: No   Sexual activity: Not Currently  Other Topics Concern   Not on file  Social History Narrative   Pt lives alone.    Social Determinants of Health   Financial Resource Strain: Low Risk  (04/24/2019)   Overall Financial Resource Strain (CARDIA)    Difficulty of Paying Living Expenses: Not hard at  all  Food Insecurity: No Food Insecurity (04/24/2019)   Hunger Vital Sign    Worried About Running Out of Food in the Last Year: Never true    Ran Out of Food in the Last Year: Never true  Transportation Needs: No Transportation Needs (04/24/2019)   PRAPARE - Administrator, Civil Service (Medical): No    Lack of Transportation (Non-Medical): No  Physical Activity: Inactive (04/24/2019)   Exercise Vital Sign    Days of Exercise per Week: 0 days    Minutes of Exercise per Session: 0 min  Stress: Stress Concern Present (04/24/2019)   [employer] of Occupational Health - Occupational Stress Questionnaire    Feeling of Stress : Very much  Social Connections: Socially Isolated (04/24/2019)   Social Connection and Isolation Panel [NHANES]    Frequency of Communication with Friends and Family: Once a week    Frequency of Social Gatherings with Friends and Family: Once a week    Attends Religious Services: Never    Database administrator or Organizations: No    Attends Banker Meetings: Never    Marital Status: Divorced  Catering manager Violence: Not At Risk (04/24/2019)   Humiliation, Afraid, Rape, and Kick questionnaire    Fear of Current or Ex-Partner: No    Emotionally Abused: No    Physically Abused: No    Sexually Abused: No    Outpatient Medications Prior to Visit  Medication Sig Dispense Refill   bisoprolol-hydrochlorothiazide (ZIAC) 10-6.25 MG tablet Take 1 tablet by mouth daily. 90 tablet 0   Cholecalciferol (VITAMIN D3) 2000 UNITS capsule Take 2,000 Units by mouth daily. Pt taking 4000 IU daily     clonazePAM (KLONOPIN) 1 MG tablet Take 1 mg by mouth 2 (two) times daily.     famotidine (PEPCID) 20 MG tablet Take 20 mg by mouth 2 (two) times daily.     Magnesium 200 MG TABS Take 1 tablet by mouth daily.     silver sulfADIAZINE (SILVADENE) 1 % cream Apply topically 2 (two) times daily. 50 g 0   Turmeric 500 MG CAPS Take 1,000 mg by mouth.      venlafaxine  XR (EFFEXOR-XR) 75 MG 24 hr capsule Take 75 mg by mouth daily.     No facility-administered medications prior to visit.    Allergies  Allergen Reactions   Aripiprazole Other (See Comments)    Incubus Dream tremmors    Quetiapine Other (See Comments)    Incubus dream   Bactrim [Sulfamethoxazole-Trimethoprim] Itching    ROS Review of Systems  Constitutional:  Negative for chills and fever.  Eyes:  Negative for visual disturbance.  Respiratory:  Negative for cough and shortness of breath.   Cardiovascular:  Negative for chest pain.  Gastrointestinal:  Positive for abdominal pain.  Genitourinary:  Positive for frequency and urgency. Negative for dysuria and hematuria.  Skin:  Positive for color change.      Objective:    Physical Exam Constitutional:      Appearance: Normal appearance.  HENT:     Head: Normocephalic and atraumatic.  Eyes:     Conjunctiva/sclera: Conjunctivae normal.  Cardiovascular:     Rate and Rhythm: Normal rate and regular rhythm.  Pulmonary:     Effort: Pulmonary effort is normal.     Breath sounds: Normal breath sounds.  Skin:    General: Skin is warm and dry.     Comments: Scars on legs from self-picking, 2 acute lesions with mild erythema, no drainage or signs of infection   Neurological:     General: No focal deficit present.     Mental Status: She is alert. Mental status is at baseline.  Psychiatric:        Mood and Affect: Mood normal.        Behavior: Behavior normal.     BP 122/78   Pulse 92   Temp (!) 97.4 F (36.3 C)   Resp 18   Ht 5\' 5"  (1.651 m)   Wt 205 lb (93 kg)   SpO2 98%   BMI 34.11 kg/m  Wt Readings from Last 3 Encounters:  08/11/22 205 lb (93 kg)  11/14/21 214 lb 12.8 oz (97.4 kg)  10/27/21 217 lb 14.4 oz (98.8 kg)     Health Maintenance Due  Topic Date Due   DEXA SCAN  Never done   MAMMOGRAM  Never done   Medicare Annual Wellness (AWV)  04/23/2020    There are no preventive care reminders to display  for this patient.  Lab Results  Component Value Date   TSH 1.26 10/27/2021   Lab Results  Component Value Date   WBC 5.8 10/27/2021   HGB 14.5 10/27/2021   HCT 44.0 10/27/2021   MCV 93.0 10/27/2021   PLT 257 10/27/2021   Lab Results  Component Value Date   NA 141 10/27/2021   K 4.5 10/27/2021   CO2 29 10/27/2021   GLUCOSE 103 (H) 10/27/2021   BUN 14 10/27/2021   CREATININE 1.16 (H) 10/27/2021   BILITOT 0.5 10/27/2021   AST 15 10/27/2021   ALT 11 10/27/2021   PROT 6.7 10/27/2021   CALCIUM 9.6 10/27/2021   EGFR 50 (L) 10/27/2021   Lab Results  Component Value Date   CHOL 204 (H) 10/27/2021   Lab Results  Component Value Date   HDL 55 10/27/2021   Lab Results  Component Value Date   LDLCALC 122 (H) 10/27/2021   Lab Results  Component Value Date   TRIG 152 (H) 10/27/2021   Lab Results  Component Value Date   CHOLHDL 3.7 10/27/2021   Lab Results  Component Value Date   HGBA1C 5.6 10/27/2021      Assessment & Plan:   1. Essential hypertension: Chronic and at goal today.  Continue bisoprolol-HCTZ 10-6.25 mg daily, refilled.  Labs due.  - CBC w/Diff/Platelet - COMPLETE METABOLIC PANEL WITH GFR - bisoprolol-hydrochlorothiazide (ZIAC) 10-6.25 MG tablet; Take 1 tablet by mouth daily.  Dispense: 90 tablet; Refill: 0  2. Familial hyperlipidemia: Recheck cholesterol today.  - Lipid Profile  3. Gastroesophageal reflux disease without esophagitis: Uncontrolled,  start pantoprazole 40 mg daily and follow-up in 3 months to reduce dose.  Patient may require EGD.  - pantoprazole (PROTONIX) 40 MG tablet; Take 1 tablet (40 mg total) by mouth daily.  Dispense: 30 tablet; Refill: 3  4. Prediabetes: Binge eating sugar, recheck A1c today.  - HgB A1c  5. Habitual self-excoriation: On medication, following with therapy.  Refill topical ointment.  - silver sulfADIAZINE (SILVADENE) 1 % cream; Apply topically 2 (two) times daily.  Dispense: 50 g; Refill: 0   Follow-up:  Return in 3 months (on 11/10/2022) for 3 months follow up, AWV.    Margarita Mail, DO

## 2022-08-11 ENCOUNTER — Encounter: Payer: Self-pay | Admitting: Internal Medicine

## 2022-08-11 ENCOUNTER — Ambulatory Visit (INDEPENDENT_AMBULATORY_CARE_PROVIDER_SITE_OTHER): Payer: Medicare Other | Admitting: Internal Medicine

## 2022-08-11 VITALS — BP 122/78 | HR 92 | Temp 97.4°F | Resp 18 | Ht 65.0 in | Wt 205.0 lb

## 2022-08-11 DIAGNOSIS — R7303 Prediabetes: Secondary | ICD-10-CM

## 2022-08-11 DIAGNOSIS — K219 Gastro-esophageal reflux disease without esophagitis: Secondary | ICD-10-CM | POA: Diagnosis not present

## 2022-08-11 DIAGNOSIS — I10 Essential (primary) hypertension: Secondary | ICD-10-CM | POA: Diagnosis not present

## 2022-08-11 DIAGNOSIS — E7849 Other hyperlipidemia: Secondary | ICD-10-CM | POA: Diagnosis not present

## 2022-08-11 DIAGNOSIS — F424 Excoriation (skin-picking) disorder: Secondary | ICD-10-CM

## 2022-08-11 LAB — CBC WITH DIFFERENTIAL/PLATELET
Basophils Absolute: 62 cells/uL (ref 0–200)
Basophils Relative: 1.1 %
Eosinophils Absolute: 90 cells/uL (ref 15–500)
Eosinophils Relative: 1.6 %
Lymphs Abs: 1932 cells/uL (ref 850–3900)
MCH: 30.4 pg (ref 27.0–33.0)
MCV: 88.8 fL (ref 80.0–100.0)
Monocytes Relative: 6.9 %
Platelets: 270 10*3/uL (ref 140–400)
Total Lymphocyte: 34.5 %

## 2022-08-11 MED ORDER — PANTOPRAZOLE SODIUM 40 MG PO TBEC
40.0000 mg | DELAYED_RELEASE_TABLET | Freq: Every day | ORAL | 3 refills | Status: DC
Start: 2022-08-11 — End: 2022-10-24

## 2022-08-11 MED ORDER — SILVER SULFADIAZINE 1 % EX CREA: TOPICAL_CREAM | Freq: Two times a day (BID) | CUTANEOUS | 0 refills | Status: DC

## 2022-08-11 MED ORDER — BISOPROLOL-HYDROCHLOROTHIAZIDE 10-6.25 MG PO TABS: 1.0000 | ORAL_TABLET | Freq: Every day | ORAL | 0 refills | Status: DC

## 2022-08-11 NOTE — Patient Instructions (Signed)
It was great seeing you today!  Plan discussed at today's visit: -Blood work ordered today, results will be uploaded to MyChart.  -Medications refilled, added Protonix 40 mg to take daily for acid reflux symptoms   Follow up in: 3 months   Take care and let us know if you have any questions or concerns prior to your next visit.  Dr. Caralee Ates

## 2022-08-12 LAB — COMPLETE METABOLIC PANEL WITH GFR
AG Ratio: 1.6 (calc) (ref 1.0–2.5)
ALT: 11 U/L (ref 6–29)
AST: 13 U/L (ref 10–35)
Albumin: 4.4 g/dL (ref 3.6–5.1)
Alkaline phosphatase (APISO): 83 U/L (ref 37–153)
BUN/Creatinine Ratio: 17 (calc) (ref 6–22)
BUN: 20 mg/dL (ref 7–25)
CO2: 27 mmol/L (ref 20–32)
Calcium: 9.5 mg/dL (ref 8.6–10.4)
Chloride: 104 mmol/L (ref 98–110)
Creat: 1.17 mg/dL — ABNORMAL HIGH (ref 0.60–1.00)
Globulin: 2.7 g/dL (calc) (ref 1.9–3.7)
Glucose, Bld: 103 mg/dL — ABNORMAL HIGH (ref 65–99)
Potassium: 4.3 mmol/L (ref 3.5–5.3)
Sodium: 141 mmol/L (ref 135–146)
Total Bilirubin: 0.5 mg/dL (ref 0.2–1.2)
Total Protein: 7.1 g/dL (ref 6.1–8.1)
eGFR: 50 mL/min/{1.73_m2} — ABNORMAL LOW (ref >=60)

## 2022-08-12 LAB — HEMOGLOBIN A1C
Hgb A1c MFr Bld: 6 % of total Hgb — ABNORMAL HIGH (ref <5.7)
Mean Plasma Glucose: 126 mg/dL
eAG (mmol/L): 7 mmol/L

## 2022-08-12 LAB — CBC WITH DIFFERENTIAL/PLATELET
Absolute Monocytes: 386 cells/uL (ref 200–950)
HCT: 42.7 % (ref 35.0–45.0)
Hemoglobin: 14.6 g/dL (ref 11.7–15.5)
MCHC: 34.2 g/dL (ref 32.0–36.0)
MPV: 10.5 fL (ref 7.5–12.5)
Neutro Abs: 3130 cells/uL (ref 1500–7800)
Neutrophils Relative %: 55.9 %
RBC: 4.81 10*6/uL (ref 3.80–5.10)
RDW: 12.9 % (ref 11.0–15.0)
WBC: 5.6 10*3/uL (ref 3.8–10.8)

## 2022-08-12 LAB — LIPID PANEL
Cholesterol: 232 mg/dL — ABNORMAL HIGH (ref <200)
HDL: 57 mg/dL (ref >=50)
LDL Cholesterol (Calc): 153 mg/dL (calc) — ABNORMAL HIGH
Non-HDL Cholesterol (Calc): 175 mg/dL (calc) — ABNORMAL HIGH (ref <130)
Total CHOL/HDL Ratio: 4.1 (calc) (ref <5.0)
Triglycerides: 105 mg/dL (ref <150)

## 2022-09-08 ENCOUNTER — Telehealth: Payer: Self-pay

## 2022-09-08 ENCOUNTER — Ambulatory Visit (INDEPENDENT_AMBULATORY_CARE_PROVIDER_SITE_OTHER): Payer: Medicare Other

## 2022-09-08 DIAGNOSIS — Z0000 Encounter for general adult medical examination without abnormal findings: Secondary | ICD-10-CM | POA: Diagnosis not present

## 2022-09-08 NOTE — Telephone Encounter (Signed)
The pt had an AWV appointment today, and she complained of intermittent left knee pain x 2-3 days that worsens with applying pressure. She states she has chronic knee pain, but the pain seems to have worsened over the past few days. I offered the patient an appointment and she declined. She wants to know if you can recommend something she can take for the pain. She has been applying ice and resting the knee to help with the discomfort. Please send the response to the clinical pool/ CMA

## 2022-09-08 NOTE — Progress Notes (Signed)
I connected with  Katherine Gould on 09/08/22 by a audio enabled telemedicine application and verified that I am speaking with the correct person using two identifiers.  Patient Location: Home  Provider Location: Home Office  I discussed the limitations of evaluation and management by telemedicine. The patient expressed understanding and agreed to proceed.   Subjective:   Katherine Gould is a 72 y.o. female who presents for Medicare Annual (Subsequent) preventive examination.  Review of Systems    Per HPI unless specifically indicated below.  Cardiac Risk Factors include: advanced age (>27men, >59 women);female gender, Hypertension, and Hyperlipidemia.           Objective:    There were no vitals filed for this visit. There is no height or weight on file to calculate BMI.     09/08/2022    2:22 PM 04/24/2019   12:00 PM 03/11/2019   12:59 PM 10/11/2018    1:28 PM  Advanced Directives  Does Patient Have a Medical Advance Directive? No No No No  Would patient like information on creating a medical advance directive? No - Patient declined Yes (MAU/Ambulatory/Procedural Areas - Information given)      Current Medications (verified) Outpatient Encounter Medications as of 09/08/2022  Medication Sig   bisoprolol-hydrochlorothiazide (ZIAC) 10-6.25 MG tablet Take 1 tablet by mouth daily.   Cholecalciferol (VITAMIN D3) 2000 UNITS capsule Take 2,000 Units by mouth daily. Pt taking 4000 IU daily   clonazePAM (KLONOPIN) 1 MG tablet Take 1 mg by mouth 2 (two) times daily.   famotidine (PEPCID) 20 MG tablet Take 20 mg by mouth 2 (two) times daily.   Magnesium 200 MG TABS Take 1 tablet by mouth daily.   silver sulfADIAZINE (SILVADENE) 1 % cream Apply topically 2 (two) times daily.   venlafaxine XR (EFFEXOR-XR) 75 MG 24 hr capsule Take 75 mg by mouth daily.   pantoprazole (PROTONIX) 40 MG tablet Take 1 tablet (40 mg total) by mouth daily. (Patient not taking: Reported on 09/08/2022)   Turmeric  500 MG CAPS Take 1,000 mg by mouth.  (Patient not taking: Reported on 09/08/2022)   No facility-administered encounter medications on file as of 09/08/2022.    Allergies (verified) Aripiprazole, Quetiapine, and Bactrim [sulfamethoxazole-trimethoprim]   History: Past Medical History:  Diagnosis Date   Cervical polyp    Chronic kidney disease    CKD (chronic kidney disease)    Dysthymic disorder    Esophageal reflux    Hyperlipidemia    Hypertension    Interstitial cystitis    Obsessive-compulsive disorders    Other abnormal glucose    Other abnormal glucose    Other B-complex deficiencies    Past Surgical History:  Procedure Laterality Date   LASER ABLATION CONDYLOMA CERVICAL / VULVAR     Family History  Problem Relation Age of Onset   Heart failure Mother    Aortic stenosis Mother    Hypertension Mother    Stroke Mother    Glaucoma Mother    Heart attack Father    Hypertension Father    Heart attack Paternal Grandfather    Glaucoma Sister    Hypertension Sister    Atrial fibrillation Sister    Heart failure Sister    Anxiety disorder Sister    Social History   Socioeconomic History   Marital status: Divorced    Spouse name: Not on file   Number of children: 1   Years of education: 12   Highest education level:  High school graduate  Occupational History   Occupation: disability  Tobacco Use   Smoking status: Never   Smokeless tobacco: Never  Vaping Use   Vaping Use: Never used  Substance and Sexual Activity   Alcohol use: No   Drug use: No   Sexual activity: Not Currently  Other Topics Concern   Not on file  Social History Narrative   Pt lives alone.    Social Determinants of Health   Financial Resource Strain: Low Risk  (09/08/2022)   Overall Financial Resource Strain (CARDIA)    Difficulty of Paying Living Expenses: Not hard at all  Food Insecurity: No Food Insecurity (09/08/2022)   Hunger Vital Sign    Worried About Running Out of Food in the  Last Year: Never true    Ran Out of Food in the Last Year: Never true  Transportation Needs: No Transportation Needs (09/08/2022)   PRAPARE - Administrator, Civil Service (Medical): No    Lack of Transportation (Non-Medical): No  Physical Activity: Insufficiently Active (09/08/2022)   Exercise Vital Sign    Days of Exercise per Week: 7 days    Minutes of Exercise per Session: 10 min  Stress: Stress Concern Present (09/08/2022)   [employer] of Occupational Health - Occupational Stress Questionnaire    Feeling of Stress : Very much  Social Connections: Socially Isolated (09/08/2022)   Social Connection and Isolation Panel [NHANES]    Frequency of Communication with Friends and Family: More than three times a week    Frequency of Social Gatherings with Friends and Family: Twice a week    Attends Religious Services: Never    Database administrator or Organizations: No    Attends Engineer, structural: Never    Marital Status: Divorced    Tobacco Counseling Counseling given: Not Answered   Clinical Intake:  Pre-visit preparation completed: No  Pain : No/denies pain     Nutritional Status: BMI > 30  Obese Nutritional Risks: Unintentional weight loss Diabetes: No  How often do you need to have someone help you when you read instructions, pamphlets, or other written materials from your doctor or pharmacy?: 1 - Never  Diabetic?No  Interpreter Needed?: No  Information entered by :: Laurel Dimmer, CMA   Activities of Daily Living    09/08/2022    2:13 PM 08/11/2022   11:35 AM  In your present state of health, do you have any difficulty performing the following activities:  Hearing? 0 0  Vision? 0 0  Difficulty concentrating or making decisions? 1 1  Walking or climbing stairs? 1 1  Dressing or bathing? 1 0  Doing errands, shopping? 0 0    Patient Care Team: Margarita Mail, DO as PCP - General (Internal Medicine) Rodena Medin, [facility]  (Inactive) (Pharmacist)  Indicate any recent Medical Services you may have received from other than [facility] providers in the past year (date may be approximate).     Assessment:   This is a routine wellness examination for Katherine Gould.  Hearing/Vision screen Denies any hearing issues. Denies any vision changes. Annual Eye Exam, [facility]    Dietary issues and exercise activities discussed: Current Exercise Habits: Structured exercise class, Type of exercise: stretching, Time (Minutes): 15, Frequency (Times/Week): 7, Weekly Exercise (Minutes/Week): 105, Intensity: Mild, Exercise limited by: orthopedic condition(s)   Goals Addressed   None    Depression Screen    09/08/2022    2:13 PM 08/11/2022   11:35  AM 11/14/2021    3:52 PM 10/27/2021    1:04 PM 10/20/2021   11:34 AM 07/26/2021    8:16 AM 03/03/2021    2:16 PM  PHQ 2/9 Scores  PHQ - 2 Score 6 6 6 6  0 0 6  PHQ- 9 Score  24 25 25   0 26    Fall Risk    09/08/2022    2:13 PM 08/11/2022   11:35 AM 11/14/2021    3:46 PM 10/27/2021    1:01 PM 10/20/2021   11:34 AM  Fall Risk   Falls in the past year? 0 1 1 1  0  Number falls in past yr: 0 1 0 0 0  Injury with Fall? 0 0 0 0 0  Risk for fall due to : No Fall Risks  History of fall(s) Impaired balance/gait;Impaired mobility   Follow up Falls evaluation completed  Falls evaluation completed  Falls evaluation completed    FALL RISK PREVENTION PERTAINING TO THE HOME:  Any stairs in or around the home? No  If so, are there any without handrails? No  Home free of loose throw rugs in walkways, pet beds, electrical cords, etc? Yes  Adequate lighting in your home to reduce risk of falls? Yes   ASSISTIVE DEVICES UTILIZED TO PREVENT FALLS:  Life alert? No  Use of a cane, walker or w/c? No  Grab bars in the bathroom? No  Shower chair or bench in shower? Yes  Elevated toilet seat or a handicapped toilet? Yes   TIMED UP AND GO:  Was the test performed? Unable to perform, virtual  appointment   Cognitive Function:        09/08/2022    2:19 PM  6CIT Screen  What Year? 0 points  What month? 0 points  What time? 0 points  Count back from 20 0 points  Months in reverse 0 points  Repeat phrase 0 points  Total Score 0 points    Immunizations Immunization History  Administered Date(s) Administered   Fluad Quad(high Dose 65+) 01/22/2020, 12/16/2020   Influenza Inj Mdck Quad Pf 02/01/2018   Influenza, High Dose Seasonal PF 04/23/2017, 02/11/2019   Influenza, Seasonal, Injecte, Preservative Fre 03/15/2010, 01/29/2013   Influenza,inj,Quad PF,6+ Mos 01/15/2014, 12/30/2014, 01/27/2016   Influenza-Unspecified 03/30/2011, 03/20/2012, 01/29/2013, 02/11/2019   PFIZER(Purple Top)SARS-COV-2 Vaccination 11/20/2019, 12/11/2019, 05/13/2020   Pneumococcal Conjugate-13 03/24/2019   Pneumococcal Polysaccharide-23 08/01/2016   Zoster, Live 01/15/2014    TDAP status: Up to date  Flu Vaccine status: Up to date  Pneumococcal vaccine status: Up to date  Covid-19 vaccine status: Information provided on how to obtain vaccines.   Qualifies for Shingles Vaccine? Yes   Zostavax completed No   Shingrix Completed?: No.    Education has been provided regarding the importance of this vaccine. Patient has been advised to Gould insurance company to determine out of pocket expense if they have not yet received this vaccine. Advised may also receive vaccine at local pharmacy or Health Dept. Verbalized acceptance and understanding.  Screening Tests Health Maintenance  Topic Date Due   DEXA SCAN  Never done   MAMMOGRAM  Never done   COVID-19 Vaccine (4 - 2023-24 season) 12/16/2021   Zoster Vaccines- Shingrix (1 of 2) 11/10/2022 (Originally 10/27/2000)   INFLUENZA VACCINE  11/16/2022   Medicare Annual Wellness (AWV)  09/08/2023   Colonoscopy  04/22/2025   Pneumonia Vaccine 64+ Years old  Completed   Hepatitis C Screening  Completed   HPV VACCINES  Aged  Out   DTaP/Tdap/Td  Discontinued     Health Maintenance  Health Maintenance Due  Topic Date Due   DEXA SCAN  Never done   MAMMOGRAM  Never done   COVID-19 Vaccine (4 - 2023-24 season) 12/16/2021    Colorectal cancer screening: Type of screening: Colonoscopy. Completed 04/23/2015. Repeat every 10 years  Mammogram: pt declined   Bone Density Status: pt declined      Lung Cancer Screening: (Low Dose CT Chest recommended if Age 15-80 years, 30 pack-year currently smoking OR have quit w/in 15years.) does not qualify.   Lung Cancer Screening Referral: not applicable   Additional Screening:  Hepatitis C Screening: does qualify; Completed 04/09/2019  Vision Screening: Recommended annual ophthalmology exams for early detection of glaucoma and other disorders of the eye. Is the patient up to date with their annual eye exam?  Yes  Who is the provider or what is the name of the office in which the patient attends annual eye exams?  If pt is not established with a provider, would they like to be referred to a provider to establish care? No .   Dental Screening: Recommended annual dental exams for proper oral hygiene  Community Resource Referral / Chronic Care Management: CRR required this visit?  No   CCM required this visit?  No      Plan:     I have personally reviewed and noted the following in the patient's chart:   Medical and social history Use of alcohol, tobacco or illicit drugs  Current medications and supplements including opioid prescriptions. Patient is not currently taking opioid prescriptions. Functional ability and status Nutritional status Physical activity Advanced directives List of other physicians Hospitalizations, surgeries, and ER visits in previous 12 months Vitals Screenings to include cognitive, depression, and falls Referrals and appointments  In addition, I have reviewed and discussed with patient certain preventive protocols, quality metrics, and best practice  recommendations. A written personalized care plan for preventive services as well as general preventive health recommendations were provided to patient.    Katherine Gould , Thank you for taking time to come for your Medicare Wellness Visit. I appreciate your ongoing commitment to your health goals. Please review the following plan we discussed and let me know if I can assist you in the future.   These are the goals we discussed:  Goals      Chronic Care Management     CARE PLAN ENTRY (see longitudinal plan of care for additional care plan information)  Current Barriers:  Chronic Disease Management support, education, and care coordination needs related to Hypertension, Hyperlipidemia, and Anxiety   Hypertension BP Readings from Last 3 Encounters:  12/05/19 136/86  11/14/19 (!) 150/100  08/15/19 122/82  Pharmacist Clinical Goal(s): Over the next 90 days, patient will work with PharmD and providers to maintain BP goal <140/90 Current regimen:  Bisoprolol-HCTZ 10-6.25 mg daily Interventions: None Patient self care activities - Over the next 90 days, patient will: Check BP weekly, document, and provide at future appointments Ensure daily salt intake < 2300 mg/day  Hyperlipidemia Lab Results  Component Value Date/Time   LDLCALC 142 (H) 11/12/2019 10:22 AM  Pharmacist Clinical Goal(s): Over the next 90 days, patient will work with PharmD and providers to achieve LDL goal < 100 Current regimen:  None Interventions: None Patient self care activities - Over the next 90 days, patient will: Implement significant calorie and saturated fat reduction Consider cholesterol medication as next step if LDL not below  100 in 3 months  Diabetes Lab Results  Component Value Date/Time   HGBA1C 5.8 (H) 07/31/2019 09:07 AM   HGBA1C 5.9 (H) 04/09/2019 12:00 AM  Pharmacist Clinical Goal(s): Over the next 90 days, patient will work with PharmD and providers to maintain A1c goal <7% Current regimen:   None Interventions: None Patient self care activities - Over the next 90 days, patient will: Check blood sugar once daily, document, and provide at future appointments Contact provider with any episodes of hypoglycemia  Medication management Pharmacist Clinical Goal(s): Over the next 90 days, patient will work with PharmD and providers to achieve optimal medication adherence Current pharmacy: [facility] Interventions Comprehensive medication review performed. Utilize [facility] pharmacy for medication synchronization, packaging and delivery Patient self care activities - Over the next 90 days, patient will: Focus on medication adherence by working with new pharmacy to ensure timely and accurate medication delivery Take medications as prescribed Report any questions or concerns to PharmD and/or provider(s)  Initial goal documentation      Patient Stated     Patient states she would like to lower cholesterol with healthy diet and physical activity         This is a list of the screening recommended for you and due dates:  Health Maintenance  Topic Date Due   DEXA scan (bone density measurement)  Never done   Mammogram  Never done   COVID-19 Vaccine (4 - 2023-24 season) 12/16/2021   Zoster (Shingles) Vaccine (1 of 2) 11/10/2022*   Flu Shot  11/16/2022   Medicare Annual Wellness Visit  09/08/2023   Colon Cancer Screening  04/22/2025   Pneumonia Vaccine  Completed   Hepatitis C Screening  Completed   HPV Vaccine  Aged Out   DTaP/Tdap/Td vaccine  Discontinued  *Topic was postponed. The date shown is not the original due date.     Katherine Gould, CMA   09/08/2022   Nurse Notes: Approximately 30 minute Non-Face -To-Face Medicare Wellness Visit

## 2022-09-08 NOTE — Patient Instructions (Signed)

## 2022-09-08 NOTE — Telephone Encounter (Signed)
Pt refused appt for here and for [facility]. She wanted to know if you could give her advise on how much tylenol she could take because she did not see the dosage on the bottle.

## 2022-09-12 NOTE — Telephone Encounter (Signed)
Called no answer and mailbox full.  

## 2022-10-10 DIAGNOSIS — F332 Major depressive disorder, recurrent severe without psychotic features: Secondary | ICD-10-CM | POA: Diagnosis not present

## 2022-10-10 DIAGNOSIS — F603 Borderline personality disorder: Secondary | ICD-10-CM | POA: Diagnosis not present

## 2022-10-10 DIAGNOSIS — F411 Generalized anxiety disorder: Secondary | ICD-10-CM | POA: Diagnosis not present

## 2022-10-23 ENCOUNTER — Ambulatory Visit: Payer: Self-pay | Admitting: *Deleted

## 2022-10-23 NOTE — Telephone Encounter (Signed)
  Chief Complaint: urinary sx. Retention , frequency  Symptoms: urinary retention pain  at times. Blood noted with wiping. Hx interstitial cystitis.  Frequency: pain to day . Retention 3-4 days ago  Pertinent Negatives: Patient denies worsening sx fever, no burning with urination Disposition: [] ED /[] Urgent Care (no appt availability in office) / [] Appointment(In office/virtual)/ []  [facility]/ [] Home Care/ [x] Refused Recommended Disposition /[] [facility]/ []  Follow-up with PCP Additional Notes:   Recommended OV .no available appt with PCP until Friday. Patient requesting not to see Angelica Chessman , PA.  Please advise regarding appt within 24 hours . Declined UC recommendation due to not want to see PC. Please advise . Patient would like a call back   Reason for Disposition  Urinating more frequently than usual (i.e., frequency)  Answer Assessment - Initial Assessment Questions 1. SYMPTOM: "What's the main symptom you're concerned about?" (e.g., frequency, incontinence)     Urinary retention and pain in abdomen 2. ONSET: "When did the  sx  start?"     Bladder retention 3- 4 days ago and this am urinary pain  3. PAIN: "Is there any pain?" If Yes, ask: "How bad is it?" (Scale: 1-10; mild, moderate, severe)     Yes pain  4. CAUSE: "What do you think is causing the symptoms?"     Hx interstial cystitis  5. OTHER SYMPTOMS: "Do you have any other symptoms?" (e.g., blood in urine, fever, flank pain, pain with urination)     Pain with urination, blood noted after wiping after urination  6. PREGNANCY: "Is there any chance you are pregnant?" "When was your last menstrual period?"     na  Protocols used: Urinary Symptoms-A-AH

## 2022-10-23 NOTE — Telephone Encounter (Signed)
Spoke with pt and she will see Dr Caralee Ates tomorrow at 9:20

## 2022-10-23 NOTE — Telephone Encounter (Signed)
9:20 tomorrow is all I can do?

## 2022-10-24 ENCOUNTER — Encounter: Payer: Self-pay | Admitting: Internal Medicine

## 2022-10-24 ENCOUNTER — Ambulatory Visit: Payer: Medicare Other | Admitting: Nurse Practitioner

## 2022-10-24 ENCOUNTER — Ambulatory Visit (INDEPENDENT_AMBULATORY_CARE_PROVIDER_SITE_OTHER): Payer: Medicare Other | Admitting: Internal Medicine

## 2022-10-24 VITALS — BP 136/80 | HR 80 | Temp 97.8°F | Resp 18 | Ht 65.0 in | Wt 206.0 lb

## 2022-10-24 DIAGNOSIS — R350 Frequency of micturition: Secondary | ICD-10-CM

## 2022-10-24 NOTE — Progress Notes (Deleted)
   Acute Office Visit  Subjective:     Patient ID: Katherine Gould, female    DOB: 1951/04/16, 72 y.o.   MRN: [id number]  No chief complaint on file.   HPI Patient is in today for UTI concerns.   URINARY SYMPTOMS  Dysuria: {Blank single:19197::"yes","no","burning"} Urinary frequency: {Blank single:19197::"yes","no"} Urgency: {Blank single:19197::"yes","no"} Small volume voids: {Blank single:19197::"yes","no"} Symptom severity: {Blank single:19197::"yes","no"} Urinary incontinence: {Blank single:19197::"yes","no"} Foul odor: {Blank single:19197::"yes","no"} Hematuria: {Blank single:19197::"yes","no"} Abdominal pain: {Blank single:19197::"yes","no"} Back pain: {Blank single:19197::"yes","no"} Suprapubic pain/pressure: {Blank single:19197::"yes","no"} Flank pain: {Blank single:19197::"yes","no"} Fever:  {Blank multiple:19196::"yes","no","subjective","low grade"} Vomiting: {Blank single:19197::"yes","no"} Relief with cranberry juice: {Blank single:19197::"yes","no"} Relief with pyridium: {Blank single:19197::"yes","no"} Status: better/worse/stable Previous urinary tract infection: {Blank single:19197::"yes","no"} Recurrent urinary tract infection: {Blank single:19197::"yes","no"} Sexual activity: No sexually active/monogomous/practicing safe sex History of sexually transmitted disease: {Blank single:19197::"yes","no"} Penile discharge: {Blank single:19197::"yes","no"} Treatments attempted: {Blank multiple:19196::"none","antibiotics","pyridium","cranberry","increasing fluids"}    ROS      Objective:    There were no vitals taken for this visit. {Vitals History (Optional):23777}  Physical Exam  No results found for any visits on 10/24/22.      Assessment & Plan:   Problem List Items Addressed This Visit   None   No orders of the defined types were placed in this encounter.   No follow-ups on file.  Margarita Mail, DO

## 2022-10-24 NOTE — Progress Notes (Signed)
Patient not seen, was unable to provide urine sample. Will return with sample.

## 2022-10-24 NOTE — Progress Notes (Unsigned)
   Acute Office Visit  Subjective:     Patient ID: Katherine Gould, female    DOB: 04-07-1951, 72 y.o.   MRN: [id number]  No chief complaint on file.   HPI Patient is in today for concerns for UTI.  URINARY SYMPTOMS  Dysuria: {Blank single:19197::"yes","no","burning"} Urinary frequency: {Blank single:19197::"yes","no"} Urgency: {Blank single:19197::"yes","no"} Small volume voids: {Blank single:19197::"yes","no"} Symptom severity: {Blank single:19197::"yes","no"} Urinary incontinence: {Blank single:19197::"yes","no"} Foul odor: {Blank single:19197::"yes","no"} Hematuria: {Blank single:19197::"yes","no"} Abdominal pain: {Blank single:19197::"yes","no"} Back pain: {Blank single:19197::"yes","no"} Suprapubic pain/pressure: {Blank single:19197::"yes","no"} Flank pain: {Blank single:19197::"yes","no"} Fever:  {Blank multiple:19196::"yes","no","subjective","low grade"} Vomiting: {Blank single:19197::"yes","no"} Relief with cranberry juice: {Blank single:19197::"yes","no"} Relief with pyridium: {Blank single:19197::"yes","no"} Status: better/worse/stable Previous urinary tract infection: {Blank single:19197::"yes","no"} Recurrent urinary tract infection: {Blank single:19197::"yes","no"} Sexual activity: No sexually active/monogomous/practicing safe sex History of sexually transmitted disease: {Blank single:19197::"yes","no"} Penile discharge: {Blank single:19197::"yes","no"} Treatments attempted: {Blank multiple:19196::"none","antibiotics","pyridium","cranberry","increasing fluids"}    ROS      Objective:    There were no vitals taken for this visit. {Vitals History (Optional):23777}  Physical Exam  No results found for any visits on 10/25/22.      Assessment & Plan:   Problem List Items Addressed This Visit   None   No orders of the defined types were placed in this encounter.   No follow-ups on file.  Margarita Mail, DO

## 2022-10-25 ENCOUNTER — Encounter: Payer: Self-pay | Admitting: Internal Medicine

## 2022-10-25 ENCOUNTER — Ambulatory Visit (INDEPENDENT_AMBULATORY_CARE_PROVIDER_SITE_OTHER): Payer: Medicare Other | Admitting: Internal Medicine

## 2022-10-25 VITALS — BP 136/80 | HR 62 | Temp 97.8°F | Resp 18 | Ht 65.0 in | Wt 206.0 lb

## 2022-10-25 DIAGNOSIS — R300 Dysuria: Secondary | ICD-10-CM | POA: Diagnosis not present

## 2022-10-25 DIAGNOSIS — N3000 Acute cystitis without hematuria: Secondary | ICD-10-CM | POA: Diagnosis not present

## 2022-10-25 DIAGNOSIS — R319 Hematuria, unspecified: Secondary | ICD-10-CM | POA: Diagnosis not present

## 2022-10-25 DIAGNOSIS — R350 Frequency of micturition: Secondary | ICD-10-CM | POA: Diagnosis not present

## 2022-10-25 LAB — POCT URINALYSIS DIPSTICK
Bilirubin, UA: NEGATIVE
Glucose, UA: NEGATIVE
Ketones, UA: NEGATIVE
Nitrite, UA: POSITIVE
Odor: NORMAL
Protein, UA: NEGATIVE
Spec Grav, UA: 1.025 (ref 1.010–1.025)
Urobilinogen, UA: 0.2 E.U./dL
pH, UA: 6 (ref 5.0–8.0)

## 2022-10-25 MED ORDER — NITROFURANTOIN MONOHYD MACRO 100 MG PO CAPS
100.0000 mg | ORAL_CAPSULE | Freq: Two times a day (BID) | ORAL | 0 refills | Status: AC
Start: 2022-10-25 — End: 2022-10-30

## 2022-10-26 LAB — URINE CULTURE: SPECIMEN QUALITY:: ADEQUATE

## 2022-10-28 LAB — URINE CULTURE: MICRO NUMBER:: 15183298

## 2022-11-13 ENCOUNTER — Ambulatory Visit: Payer: Medicare Other | Admitting: Internal Medicine

## 2022-11-17 ENCOUNTER — Ambulatory Visit: Payer: Medicare Other | Admitting: Internal Medicine

## 2022-11-28 ENCOUNTER — Other Ambulatory Visit: Payer: Self-pay | Admitting: Internal Medicine

## 2022-11-28 DIAGNOSIS — I10 Essential (primary) hypertension: Secondary | ICD-10-CM

## 2022-11-28 DIAGNOSIS — K219 Gastro-esophageal reflux disease without esophagitis: Secondary | ICD-10-CM

## 2022-11-30 NOTE — Telephone Encounter (Signed)
Requested Prescriptions  Pending Prescriptions Disp Refills   bisoprolol-hydrochlorothiazide (ZIAC) 10-6.25 MG tablet [Pharmacy Med Name: BISOPROLOL/HCTZ 10MG /6.25MG  TABS] 90 tablet 1    Sig: TAKE 1 TABLET BY MOUTH DAILY     Cardiovascular: Beta Blocker + Diuretic Combos Failed - 11/28/2022  3:39 PM      Failed - Cr in normal range and within 180 days    Creat  Date Value Ref Range Status  08/11/2022 1.17 (H) 0.60 - 1.00 mg/dL Final         Failed - eGFR in normal range and within 180 days    GFR, Est African American  Date Value Ref Range Status  11/12/2019 74 > OR = 60 mL/min/1.36m2 Final   GFR, Est Non African American  Date Value Ref Range Status  11/12/2019 64 > OR = 60 mL/min/1.57m2 Final   eGFR  Date Value Ref Range Status  08/11/2022 50 (L) > OR = 60 mL/min/1.37m2 Final         Passed - K in normal range and within 180 days    Potassium  Date Value Ref Range Status  08/11/2022 4.3 3.5 - 5.3 mmol/L Final         Passed - Na in normal range and within 180 days    Sodium  Date Value Ref Range Status  08/11/2022 141 135 - 146 mmol/L Final         Passed - Last BP in normal range    BP Readings from Last 1 Encounters:  10/25/22 136/80         Passed - Last Heart Rate in normal range    Pulse Readings from Last 1 Encounters:  10/25/22 62         Passed - Valid encounter within last 6 months    Recent Outpatient Visits           1 month ago Acute cystitis without hematuria   [facility] Margarita Mail, DO   1 month ago Urinary frequency   [facility]ita Mail, DO   3 months ago Essential hypertension   [facility] Mail, DO   1 year ago Urinary frequency   [facility] Berniece Salines, FNP   1 year ago Essential hypertension   [facility] Marg[facility]ita Mail, DO       Future Appointments              In 5 days Margarita Mail, DO [place] [facility]             pantoprazole (PROTONIX) 40 MG tablet [Pharmacy Med Name: PANTOPRAZOLE 40MG  TABLETS] 90 tablet     Sig: TAKE 1 TABLET(40 MG) BY MOUTH DAILY     Gastroenterology: Proton Pump Inhibitors Passed - 11/28/2022  3:39 PM      Passed - Valid encounter within last 12 months    Recent Outpatient Visits           1 month ago Acute cystitis without hematuria   [facility] Marg[facility]ita Mail, DO   1 month ago Urinary frequency   [facility] Margarita Mail, DO   3 months ago Essential hypertension   [facility]ita Mail, DO   1 year ago Urinary frequency   [facility] Berniece Salines, FNP   1 year ago Essential hypertension  [facility]ita Mail, DO       Future Appointments             In 5 days Margarita Mail, DO [facility], [facility]

## 2022-11-30 NOTE — Telephone Encounter (Signed)
Requested medications are due for refill today.  unsure  Requested medications are on the active medications list.  no  Last refill. 08/11/2022  Future visit scheduled.   yes  Notes to clinic.  Medication was d/c'd 10/24/2022    Requested Prescriptions  Pending Prescriptions Disp Refills   pantoprazole (PROTONIX) 40 MG tablet [Pharmacy Med Name: PANTOPRAZOLE 40MG  TABLETS] 90 tablet     Sig: TAKE 1 TABLET(40 MG) BY MOUTH DAILY     Gastroenterology: Proton Pump Inhibitors Passed - 11/28/2022  3:39 PM      Passed - Valid encounter within last 12 months    Recent Outpatient Visits           1 month ago Acute cystitis without hematuria   [facility] Mail, DO   1 month ago Urinary frequency   [place] Mail, DO   3 months ago Essential hypertension   [place] [facility] Mail, DO   1 year ago Urinary frequency   [facility] Berniece Salines, FNP   1 year ago Essential hypertension   [place] [facility]ita Mail, DO       Future Appointments             In 5 days Margarita Mail, DO [facility], [facility]            Signed Prescriptions Disp Refills   bisoprolol-hydrochlorothiazide (ZIAC) 10-6.25 MG tablet 90 tablet 1    Sig: TAKE 1 TABLET BY MOUTH DAILY     Cardiovascular: Beta Blocker + Diuretic Combos Failed - 11/28/2022  3:39 PM      Failed - Cr in normal range and within 180 days    Creat  Date Value Ref Range Status  08/11/2022 1.17 (H) 0.60 - 1.00 mg/dL Final         Failed - eGFR in normal range and within 180 days    GFR, Est African American  Date Value Ref Range Status  11/12/2019 74 > OR = 60 mL/min/1.62m2 Final   GFR, Est Non African American  Date Value Ref Range Status  11/12/2019 64 > OR = 60 mL/min/1.27m2 Final   eGFR  Date Value Ref Range Status   08/11/2022 50 (L) > OR = 60 mL/min/1.80m2 Final         Passed - K in normal range and within 180 days    Potassium  Date Value Ref Range Status  08/11/2022 4.3 3.5 - 5.3 mmol/L Final         Passed - Na in normal range and within 180 days    Sodium  Date Value Ref Range Status  08/11/2022 141 135 - 146 mmol/L Final         Passed - Last BP in normal range    BP Readings from Last 1 Encounters:  10/25/22 136/80         Passed - Last Heart Rate in normal range    Pulse Readings from Last 1 Encounters:  10/25/22 62         Passed - Valid encounter within last 6 months    Recent Outpatient Visits           1 month ago Acute cystitis without hematuria   [facility]   1 month ago Urinary frequency   [facility] Mail, [place]  3 months ago Essential hypertension   [facility] Marg[facility]ita Mail, DO   1 year ago Urinary frequency   [facility] Berniece Salines, FNP   1 year ago Essential hypertension   [facility] Marg[facility]ita Mail, DO       Future Appointments             In 5 days [facility]ita Mail, DO  [facility]

## 2022-12-05 ENCOUNTER — Ambulatory Visit: Payer: Medicare Other | Admitting: Internal Medicine

## 2022-12-20 ENCOUNTER — Other Ambulatory Visit: Payer: Self-pay | Admitting: Internal Medicine

## 2022-12-20 DIAGNOSIS — I10 Essential (primary) hypertension: Secondary | ICD-10-CM

## 2022-12-21 NOTE — Telephone Encounter (Signed)
Reordered 11/30/22 #90 1 RF  Requested Prescriptions  Refused Prescriptions Disp Refills   bisoprolol-hydrochlorothiazide (ZIAC) 10-6.25 MG tablet [Pharmacy Med Name: BISOPROLOL/HCTZ 10MG /6.25MG  TABS] 90 tablet 1    Sig: TAKE 1 TABLET BY MOUTH DAILY     Cardiovascular: Beta Blocker + Diuretic Combos Failed - 12/20/2022  6:54 PM      Failed - Cr in normal range and within 180 days    Creat  Date Value Ref Range Status  08/11/2022 1.17 (H) 0.60 - 1.00 mg/dL Final         Failed - eGFR in normal range and within 180 days    GFR, Est African American  Date Value Ref Range Status  11/12/2019 74 > OR = 60 mL/min/1.6m2 Final   GFR, Est Non African American  Date Value Ref Range Status  11/12/2019 64 > OR = 60 mL/min/1.59m2 Final   eGFR  Date Value Ref Range Status  08/11/2022 50 (L) > OR = 60 mL/min/1.9m2 Final         Passed - K in normal range and within 180 days    Potassium  Date Value Ref Range Status  08/11/2022 4.3 3.5 - 5.3 mmol/L Final         Passed - Na in normal range and within 180 days    Sodium  Date Value Ref Range Status  08/11/2022 141 135 - 146 mmol/L Final         Passed - Last BP in normal range    BP Readings from Last 1 Encounters:  10/25/22 136/80         Passed - Last Heart Rate in normal range    Pulse Readings from Last 1 Encounters:  10/25/22 62         Passed - Valid encounter within last 6 months    Recent Outpatient Visits           1 month ago Acute cystitis without hematuria   [facility] Marg[facility]ita Mail, DO   1 month ago Urinary frequency   [facility] Mail, DO   4 months ago Essential hypertension   [facility] Mail, DO   1 year ago Urinary frequency   [facility] Berniece Salines, FNP   1 year ago Essential hypertension   [facility] Margarita Mail, [place]

## 2022-12-22 ENCOUNTER — Other Ambulatory Visit: Payer: Self-pay | Admitting: Internal Medicine

## 2022-12-22 ENCOUNTER — Telehealth: Payer: Self-pay | Admitting: Internal Medicine

## 2022-12-22 DIAGNOSIS — I10 Essential (primary) hypertension: Secondary | ICD-10-CM

## 2022-12-22 MED ORDER — BISOPROLOL-HYDROCHLOROTHIAZIDE 10-6.25 MG PO TABS
1.0000 | ORAL_TABLET | Freq: Every day | ORAL | 0 refills | Status: DC
Start: 2022-12-22 — End: 2023-03-16

## 2022-12-22 NOTE — Telephone Encounter (Signed)
Requested Prescriptions  Pending Prescriptions Disp Refills   bisoprolol-hydrochlorothiazide (ZIAC) 10-6.25 MG tablet 90 tablet 0    Sig: Take 1 tablet by mouth daily.     Cardiovascular: Beta Blocker + Diuretic Combos Failed - 12/22/2022  4:21 PM      Failed - Cr in normal range and within 180 days    Creat  Date Value Ref Range Status  08/11/2022 1.17 (H) 0.60 - 1.00 mg/dL Final         Failed - eGFR in normal range and within 180 days    GFR, Est African American  Date Value Ref Range Status  11/12/2019 74 > OR = 60 mL/min/1.31m2 Final   GFR, Est Non African American  Date Value Ref Range Status  11/12/2019 64 > OR = 60 mL/min/1.29m2 Final   eGFR  Date Value Ref Range Status  08/11/2022 50 (L) > OR = 60 mL/min/1.69m2 Final         Passed - K in normal range and within 180 days    Potassium  Date Value Ref Range Status  08/11/2022 4.3 3.5 - 5.3 mmol/L Final         Passed - Na in normal range and within 180 days    Sodium  Date Value Ref Range Status  08/11/2022 141 135 - 146 mmol/L Final         Passed - Last BP in normal range    BP Readings from Last 1 Encounters:  10/25/22 136/80         Passed - Last Heart Rate in normal range    Pulse Readings from Last 1 Encounters:  10/25/22 62         Passed - Valid encounter within last 6 months    Recent Outpatient Visits           1 month ago Acute cystitis without hematuria   [facility]   1 month ago Urinary frequency   [facility] Mail, DO   4 months ago Essential hypertension   [facility] Marg[facility]ita Mail, DO   1 year ago Urinary frequency   [facility] Berniece Salines, FNP   1 year ago Essential hypertension   [facility] Margarita Mail, [place]

## 2022-12-22 NOTE — Telephone Encounter (Signed)
Called and spoke with patient earlier today and told her to call back once she spoke with pharmacy. Advised patient that medication would be refilled when she called back.

## 2022-12-22 NOTE — Telephone Encounter (Signed)
Medication Refill - Medication: bisoprolol-hydrochlorothiazide (ZIAC) 10-6.25 MG tablet   Has the patient contacted their pharmacy? No. (Agent: If no, request that the patient contact the pharmacy for the refill. If patient does not wish to contact the pharmacy document the reason why and proceed with request.) (Agent: If yes, when and what did the pharmacy advise?)  Preferred Pharmacy (with phone number or street name):  [id number] [place], [place] - [address] AT [facility] OF [address] [facility] Phone: [phone]  Fax: [phone]     Has the patient been seen for an appointment in the last year OR does the patient have an upcoming appointment? Yes.    Agent: Please be advised that RX refills may take up to 3 business days. We ask that you follow-up with your pharmacy.  Patient will take her last dosage today and will need her meds before the weekend

## 2022-12-22 NOTE — Telephone Encounter (Signed)
Pt is calling to check on the status of bisoprolol-hydroch/lorothiazide (ZIAC) 10-6.25 MG tabl Pt is calling to report that the pharmacy has not received the medication. Please advise

## 2022-12-22 NOTE — Telephone Encounter (Signed)
Called [facility] pharmacy to verify that bisoprolol-hydroch/lorothiazide [facility]) 10-6.25 MG tablet was refilled 11/30/22 for 90 days and 1 refill. Long wait time of 15 minutes without speaking to any staff. Will try again at a later time.  Called patient to to clarify if she had reached out to pharmacy for this refill. Patient states that she called [facility] pharmacy a couple of days ago and they told her that they had not heard from her doctor's office for a refill. Patient states she has had this problem before with this pharmacy and is considering changing to a new pharmacy. Patient states that she will call [facility] to verify if bisoprolol-hydroch/lorothiazide (ZIAC) 10-6.25 MG tablet is on file for refill. Advise patient to call office back once she has talked to pharmacy.

## 2023-01-29 DIAGNOSIS — F411 Generalized anxiety disorder: Secondary | ICD-10-CM | POA: Diagnosis not present

## 2023-01-29 DIAGNOSIS — F603 Borderline personality disorder: Secondary | ICD-10-CM | POA: Diagnosis not present

## 2023-01-29 DIAGNOSIS — F332 Major depressive disorder, recurrent severe without psychotic features: Secondary | ICD-10-CM | POA: Diagnosis not present

## 2023-02-02 ENCOUNTER — Ambulatory Visit: Payer: Medicare Other | Admitting: Physician Assistant

## 2023-03-13 ENCOUNTER — Other Ambulatory Visit: Payer: Self-pay | Admitting: Internal Medicine

## 2023-03-13 DIAGNOSIS — I10 Essential (primary) hypertension: Secondary | ICD-10-CM

## 2023-03-13 NOTE — Telephone Encounter (Signed)
Requested medication (s) are due for refill today: yes  Requested medication (s) are on the active medication list: yes  Last refill:  12/22/22 #90  Future visit scheduled: yes  Notes to clinic:  overdue labs   Requested Prescriptions  Pending Prescriptions Disp Refills   bisoprolol-hydrochlorothiazide (ZIAC) 10-6.25 MG tablet [Pharmacy Med Name: BISOPROLOL/HCTZ 10MG /6.25MG  TABS] 90 tablet 0    Sig: Take 1 tablet by mouth daily.     Cardiovascular: Beta Blocker + Diuretic Combos Failed - 03/13/2023  7:14 AM      Failed - K in normal range and within 180 days    Potassium  Date Value Ref Range Status  08/11/2022 4.3 3.5 - 5.3 mmol/L Final         Failed - Na in normal range and within 180 days    Sodium  Date Value Ref Range Status  08/11/2022 141 135 - 146 mmol/L Final         Failed - Cr in normal range and within 180 days    Creat  Date Value Ref Range Status  08/11/2022 1.17 (H) 0.60 - 1.00 mg/dL Final         Failed - eGFR in normal range and within 180 days    GFR, Est African American  Date Value Ref Range Status  11/12/2019 74 > OR = 60 mL/min/1.25m2 Final   GFR, Est Non African American  Date Value Ref Range Status  11/12/2019 64 > OR = 60 mL/min/1.42m2 Final   eGFR  Date Value Ref Range Status  08/11/2022 50 (L) > OR = 60 mL/min/1.57m2 Final         Passed - Last BP in normal range    BP Readings from Last 1 Encounters:  10/25/22 136/80         Passed - Last Heart Rate in normal range    Pulse Readings from Last 1 Encounters:  10/25/22 62         Passed - Valid encounter within last 6 months    Recent Outpatient Visits           4 months ago Acute cystitis without hematuria   [facility], DO   4 months ago Urinary frequency   [facility] Marg[facility]ita Mail, DO   7 months ago Essential hypertension   [facility] Marg[facility]ita Mail, DO   1 year  ago Urinary frequency   [facility] Berniece Salines, FNP   1 year ago Essential hypertension   [facility] Margarita Mail, DO       Future Appointments             In 1 week Zane Herald, Rudolpho Sevin, FNP [facility], [facility]

## 2023-03-16 ENCOUNTER — Other Ambulatory Visit: Payer: Self-pay | Admitting: Internal Medicine

## 2023-03-16 DIAGNOSIS — I10 Essential (primary) hypertension: Secondary | ICD-10-CM

## 2023-03-20 NOTE — Telephone Encounter (Signed)
Requested Prescriptions  Pending Prescriptions Disp Refills   bisoprolol-hydrochlorothiazide (ZIAC) 10-6.25 MG tablet [Pharmacy Med Name: BISOPROLOL/HCTZ 10MG /6.25MG  TABS] 90 tablet 0    Sig: TAKE 1 TABLET BY MOUTH DAILY     Cardiovascular: Beta Blocker + Diuretic Combos Failed - 03/16/2023  7:48 PM      Failed - K in normal range and within 180 days    Potassium  Date Value Ref Range Status  08/11/2022 4.3 3.5 - 5.3 mmol/L Final         Failed - Na in normal range and within 180 days    Sodium  Date Value Ref Range Status  08/11/2022 141 135 - 146 mmol/L Final         Failed - Cr in normal range and within 180 days    Creat  Date Value Ref Range Status  08/11/2022 1.17 (H) 0.60 - 1.00 mg/dL Final         Failed - eGFR in normal range and within 180 days    GFR, Est African American  Date Value Ref Range Status  11/12/2019 74 > OR = 60 mL/min/1.21m2 Final   GFR, Est Non African American  Date Value Ref Range Status  11/12/2019 64 > OR = 60 mL/min/1.81m2 Final   eGFR  Date Value Ref Range Status  08/11/2022 50 (L) > OR = 60 mL/min/1.49m2 Final         Passed - Last BP in normal range    BP Readings from Last 1 Encounters:  10/25/22 136/80         Passed - Last Heart Rate in normal range    Pulse Readings from Last 1 Encounters:  10/25/22 62         Passed - Valid encounter within last 6 months    Recent Outpatient Visits           4 months ago Acute cystitis without hematuria   [facility], [facility]   4 months ago Urinary frequency   [facility], DO   7 months ago Essential hypertension   [facility] Mail, DO   1 year ago Urinary frequency   [facility] Berniece Salines, FNP   1 year ago Essential hypertension   [facility] Margarita Mail, DO       Future Appointments              Tomorrow Zane Herald, Rudolpho Sevin, FNP [facility], [facility]

## 2023-03-21 ENCOUNTER — Ambulatory Visit: Payer: Medicare Other | Admitting: Nurse Practitioner

## 2023-04-02 ENCOUNTER — Other Ambulatory Visit: Payer: Self-pay | Admitting: Internal Medicine

## 2023-04-02 DIAGNOSIS — F424 Excoriation (skin-picking) disorder: Secondary | ICD-10-CM

## 2023-04-03 NOTE — Telephone Encounter (Signed)
Requested medication (s) are due for refill today: yes  Requested medication (s) are on the active medication list: yes  Last refill:  08/11/22 #50 g 0 refills  Future visit scheduled: no  Notes to clinic:  medication not assigned to a protocol. Do you want to refill Rx?     Requested Prescriptions  Pending Prescriptions Disp Refills   SSD 1 % cream [Pharmacy Med Name: SILVER SULFADIAZINE 1% CREAM 50GM] 50 g 0    Sig: APPLY TOPICALLY TO THE AFFECTED AREA TWICE DAILY     Off-Protocol Failed - 04/03/2023 11:09 AM      Failed - Medication not assigned to a protocol, review manually.      Passed - Valid encounter within last 12 months    Recent Outpatient Visits           5 months ago Acute cystitis without hematuria   [facility]   5 months ago Urinary frequency   [facility] Marg[facility] Mail, DO   7 months ago Essential hypertension   [facility] Marg[facility]ita Mail, DO   1 year ago Urinary frequency   [facility] Berniece Salines, FNP   1 year ago Essential hypertension   [facility] Margarita Mail, [place]

## 2023-05-09 DIAGNOSIS — F411 Generalized anxiety disorder: Secondary | ICD-10-CM | POA: Diagnosis not present

## 2023-05-09 DIAGNOSIS — F603 Borderline personality disorder: Secondary | ICD-10-CM | POA: Diagnosis not present

## 2023-05-09 DIAGNOSIS — F332 Major depressive disorder, recurrent severe without psychotic features: Secondary | ICD-10-CM | POA: Diagnosis not present

## 2023-05-14 ENCOUNTER — Ambulatory Visit: Payer: Self-pay

## 2023-05-14 NOTE — Telephone Encounter (Signed)
    Chief Complaint: Anxiety has been worse recently, nightmares. Sees a therapist. Symptoms: Above, had palpitations last night. Frequency: Worse recently Pertinent Negatives: Patient denies self harm thoughts Disposition: [] ED /[] Urgent Care (no appt availability in office) / [x] Appointment(In office/virtual)/ []  [place] [facility]/ [] Home Care/ [] Refused Recommended Disposition /[] [place] Mobile Bus/ []  Follow-up with PCP Additional Notes: Agrees with appointment.  Reason for Disposition  MODERATE anxiety (e.g., persistent or frequent anxiety symptoms; interferes with sleep, school, or work)  Answer Assessment - Initial Assessment Questions 1. CONCERN: "Did anything happen that prompted you to call today?"      Anxiety 2. ANXIETY SYMPTOMS: "Can you describe how you (your loved one; patient) have been feeling?" (e.g., tense, restless, panicky, anxious, keyed up, overwhelmed, sense of impending doom).      anxiety 3. ONSET: "How long have you been feeling this way?" (e.g., hours, days, weeks)     Years, but worse 4. SEVERITY: "How would you rate the level of anxiety?" (e.g., 0 - 10; or mild, moderate, severe).     Moderate-severe 5. FUNCTIONAL IMPAIRMENT: "How have these feelings affected your ability to do daily activities?" "Have you had more difficulty than usual doing your normal daily activities?" (e.g., getting better, same, worse; self-care, school, work, interactions)     Not sleeping well 6. HISTORY: "Have you felt this way before?" "Have you ever been diagnosed with an anxiety problem in the past?" (e.g., generalized anxiety disorder, panic attacks, PTSD). If Yes, ask: "How was this problem treated?" (e.g., medicines, counseling, etc.)     Yes 7. RISK OF HARM - SUICIDAL IDEATION: "Do you ever have thoughts of hurting or killing yourself?" If Yes, ask:  "Do you have these feelings now?" "Do you have a plan on how you would do this?"     No 8. TREATMENT:  "What has been  done so far to treat this anxiety?" (e.g., medicines, relaxation strategies). "What has helped?"     Medicines, therapy 9. TREATMENT - THERAPIST: "Do you have a counselor or therapist? Name?"     Yes and [facility] 10. POTENTIAL TRIGGERS: "Do you drink caffeinated beverages (e.g., coffee, colas, teas), and how much daily?" "Do you drink alcohol or use any drugs?" "Have you started any new medicines recently?"       No 11. PATIENT SUPPORT: "Who is with you now?" "Who do you live with?" "Do you have family or friends who you can talk to?"        Family 12. OTHER SYMPTOMS: "Do you have any other symptoms?" (e.g., feeling depressed, trouble concentrating, trouble sleeping, trouble breathing, palpitations or fast heartbeat, chest pain, sweating, nausea, or diarrhea)       Depression 13. PREGNANCY: "Is there any chance you are pregnant?" "When was your last menstrual period?"       No  Protocols used: Anxiety and Panic Attack-A-AH

## 2023-05-17 ENCOUNTER — Ambulatory Visit: Payer: Medicare Other | Admitting: Internal Medicine

## 2023-05-18 ENCOUNTER — Ambulatory Visit: Payer: Medicare Other | Admitting: Internal Medicine

## 2023-05-23 ENCOUNTER — Ambulatory Visit: Payer: Medicare Other | Admitting: Family Medicine

## 2023-06-04 ENCOUNTER — Ambulatory Visit: Payer: Self-pay | Admitting: *Deleted

## 2023-06-04 NOTE — Telephone Encounter (Signed)
  Chief Complaint: intermittent vomiting episodes- not daily Symptoms: vomiting- with abdominal pain, no today Frequency: 2-3 weks Pertinent Negatives: Patient denies vomiting today- or other symptoms today Disposition: [] ED /[] Urgent Care (no appt availability in office) / [x] Appointment(In office/virtual)/ []  [place] Virtual [facility]/ [] Home Care/ [] Refused Recommended Disposition /[]  Mobile Bus/ []  Follow-up with PCP Additional Notes: Patient has appointment scheduled- she is stable without symptoms today- will call back with changes

## 2023-06-04 NOTE — Telephone Encounter (Signed)
Summary: Vomitting Advice   Pt is calling to report that she was vomitting on Saturday - unable to keep food down. Please advise         Reason for Disposition  [1] MILD vomiting with diarrhea AND [2] present > 5 days  Answer Assessment - Initial Assessment Questions 1. VOMITING SEVERITY: "How many times have you vomited in the past 24 hours?"     - MILD:  1 - 2 times/day    - MODERATE: 3 - 5 times/day, decreased oral intake without significant weight loss or symptoms of dehydration    - SEVERE: 6 or more times/day, vomits everything or nearly everything, with significant weight loss, symptoms of dehydration      Moderate- vomiting  2. ONSET: "When did the vomiting begin?"      Few days before/Saturday- nothing 3. FLUIDS: "What fluids or food have you vomited up today?" "Have you been able to keep any fluids down?"     Able to eat and keep fluids down 4. ABDOMEN PAIN: "Are your having any abdomen pain?" If Yes : "How bad is it and what does it feel like?" (e.g., crampy, dull, intermittent, constant)      no 5. DIARRHEA: "Is there any diarrhea?" If Yes, ask: "How many times today?"      no 6. CONTACTS: "Is there anyone else in the family with the same symptoms?"      no 7. CAUSE: "What do you think is causing your vomiting?"     Unsure- gallbladder 8. HYDRATION STATUS: "Any signs of dehydration?" (e.g., dry mouth [not only dry lips], too weak to stand) "When did you last urinate?"     no 9. OTHER SYMPTOMS: "Do you have any other symptoms?" (e.g., fever, headache, vertigo, vomiting blood or coffee grounds, recent head injury)     no  Protocols used: Vomiting-A-AH

## 2023-06-05 ENCOUNTER — Ambulatory Visit: Payer: Medicare Other | Admitting: Family Medicine

## 2023-06-05 ENCOUNTER — Encounter: Payer: Self-pay | Admitting: Family Medicine

## 2023-06-05 VITALS — BP 130/78 | HR 69 | Resp 16 | Ht 65.0 in | Wt 205.0 lb

## 2023-06-05 DIAGNOSIS — R300 Dysuria: Secondary | ICD-10-CM

## 2023-06-05 DIAGNOSIS — R109 Unspecified abdominal pain: Secondary | ICD-10-CM | POA: Diagnosis not present

## 2023-06-05 DIAGNOSIS — N3010 Interstitial cystitis (chronic) without hematuria: Secondary | ICD-10-CM | POA: Diagnosis not present

## 2023-06-05 DIAGNOSIS — R112 Nausea with vomiting, unspecified: Secondary | ICD-10-CM | POA: Diagnosis not present

## 2023-06-05 DIAGNOSIS — K219 Gastro-esophageal reflux disease without esophagitis: Secondary | ICD-10-CM | POA: Diagnosis not present

## 2023-06-05 LAB — POCT URINALYSIS DIPSTICK
Appearance: NORMAL
Bilirubin, UA: NEGATIVE
Blood, UA: NEGATIVE
Glucose, UA: NEGATIVE
Ketones, UA: NEGATIVE
Leukocytes, UA: NEGATIVE
Nitrite, UA: POSITIVE — AB
Protein, UA: NEGATIVE
Spec Grav, UA: >=1.03 — AB (ref 1.010–1.025)
Urobilinogen, UA: 0.2 U/dL
pH, UA: 6 (ref 5.0–8.0)

## 2023-06-05 MED ORDER — PANTOPRAZOLE SODIUM 40 MG PO TBEC
40.0000 mg | DELAYED_RELEASE_TABLET | Freq: Every day | ORAL | 3 refills | Status: DC
Start: 2023-06-05 — End: 2023-09-03

## 2023-06-05 MED ORDER — ONDANSETRON 4 MG PO TBDP
4.0000 mg | ORAL_TABLET | Freq: Three times a day (TID) | ORAL | 0 refills | Status: DC | PRN
Start: 2023-06-05 — End: 2023-12-12

## 2023-06-05 NOTE — Progress Notes (Signed)
Patient ID: Katherine Gould, female    DOB: 1950-05-31, 73 y.o.   MRN: [id number]  PCP: Margarita Mail, DO  Chief Complaint  Patient presents with   Urinary Retention    X1 month   Dysuria    Subjective:   Katherine Gould is a 73 y.o. female, presents to clinic with CC of the following:  HPI  Urinary sx with hx of IC, urinary retention, not on meds and she was lost to f/up years ago with urology She reports urinary sx worse than baseline for a few weeks, including increased urgency frequency and dysuria she denies any hematuria She also notes vomiting, and sometimes pain to left side of abdomen/ribs hurting, change in bowels and constipation 2 separate spontaneous vomiting episodes, emesis is clear liquid x2 episodes once this most recent Saturday and once a week ago, she reports no vomiting, no diarrhea, and ability to tolerate PO's solid and liquids with indigestion and no reflux of acid and she hasn't been taking PPI per PCP because she does not believe it could be GERD  In response to her worsening urinary symptoms the patient got and took azo, she reportsAzo last dose 24 hours ago it helped some with her urinary sx   Urinalysis today is positive for nitrites otherwise unremarkable Results for orders placed or performed in visit on 06/05/23  POCT urinalysis dipstick   Collection Time: 06/05/23  2:11 PM  Result Value Ref Range   Color, UA Yellow    Clarity, UA Clear    Glucose, UA Negative Negative   Bilirubin, UA Negative    Ketones, UA Negative    Spec Grav, UA >=1.030 (A) 1.010 - 1.025   Blood, UA Negative    pH, UA 6.0 5.0 - 8.0   Protein, UA Negative Negative   Urobilinogen, UA 0.2 0.2 or 1.0 E.U./dL   Nitrite, UA Positive (A)    Leukocytes, UA Negative Negative   Appearance Normal    Odor None    Patient denies flank pain, fever, sweats, chills   Patient Active Problem List   Diagnosis Date Noted   Leg cramps 03/03/2021   Insomnia 12/16/2020    Hyperhidrosis 12/16/2020   Chronic fatigue 09/21/2020   Severe episode of recurrent major depressive disorder, without psychotic features (HCC) 11/14/2019   Habitual self-excoriation 11/14/2019   GAD (generalized anxiety disorder) 11/14/2019   Attention deficit hyperactivity disorder (ADHD), predominantly hyperactive type 11/14/2019   Familial hyperlipidemia 08/21/2019   Postmenopausal estrogen deficiency 04/08/2019   GERD (gastroesophageal reflux disease) 12/10/2018   OCD (obsessive compulsive disorder) 04/15/2018   Morbid obesity (HCC) 04/15/2018   Medication monitoring encounter 04/15/2018   Vitamin B12 deficiency 04/15/2018   Vitamin D deficiency 04/15/2018   Bilateral primary osteoarthritis of knee 04/15/2018   Interstitial cystitis 04/15/2018   OSA (obstructive sleep apnea) 07/14/2015   Hypertension    Hyperlipidemia 07/03/2012   Prediabetes 07/03/2012      Current Outpatient Medications:    bisoprolol-hydrochlorothiazide (ZIAC) 10-6.25 MG tablet, TAKE 1 TABLET BY MOUTH DAILY, Disp: 90 tablet, Rfl: 0   Cholecalciferol (VITAMIN D3) 2000 UNITS capsule, Take 2,000 Units by mouth daily. Pt taking 4000 IU daily, Disp: , Rfl:    clonazePAM (KLONOPIN) 1 MG tablet, Take 1 mg by mouth 2 (two) times daily., Disp: , Rfl:    pantoprazole (PROTONIX) 20 MG tablet, Take 20 mg by mouth daily., Disp: , Rfl:    SSD 1 % cream, APPLY TOPICALLY TO THE AFFECTED AREA  TWICE DAILY, Disp: 50 g, Rfl: 0   venlafaxine XR (EFFEXOR-XR) 75 MG 24 hr capsule, Take 75 mg by mouth daily., Disp: , Rfl:    famotidine (PEPCID) 20 MG tablet, Take 20 mg by mouth 2 (two) times daily. (Patient not taking: Reported on 06/05/2023), Disp: , Rfl:    Magnesium 200 MG TABS, Take 1 tablet by mouth daily. (Patient not taking: Reported on 06/05/2023), Disp: , Rfl:    Allergies  Allergen Reactions   Aripiprazole Other (See Comments)    Incubus Dream tremmors    Quetiapine Other (See Comments)    Incubus dream   Bactrim  [Sulfamethoxazole-Trimethoprim] Itching     Social History   Tobacco Use   Smoking status: Never   Smokeless tobacco: Never  Vaping Use   Vaping status: Never Used  Substance Use Topics   Alcohol use: No   Drug use: No      Chart Review Today: I personally reviewed active problem list, medication list, allergies, family history, social history, health maintenance, notes from last encounter, lab results, imaging with the patient/caregiver today.   Review of Systems  Constitutional: Negative.   HENT: Negative.    Eyes: Negative.   Respiratory: Negative.    Cardiovascular: Negative.   Gastrointestinal: Negative.   Endocrine: Negative.   Genitourinary: Negative.   Musculoskeletal: Negative.   Skin: Negative.   Allergic/Immunologic: Negative.   Neurological: Negative.   Hematological: Negative.   Psychiatric/Behavioral: Negative.    All other systems reviewed and are negative.      Objective:   Vitals:   06/05/23 1401  BP: 130/78  Pulse: 69  Resp: 16  SpO2: 97%  Weight: 205 lb (93 kg)  Height: 5\' 5"  (1.651 m)    Body mass index is 34.11 kg/m.  Physical Exam Vitals and nursing note reviewed.  Constitutional:      General: She is not in acute distress.    Appearance: Normal appearance. She is well-developed. She is obese. She is not ill-appearing, toxic-appearing or diaphoretic.  HENT:     Head: Normocephalic and atraumatic.     Nose: Nose normal.  Eyes:     General:        Right eye: No discharge.        Left eye: No discharge.     Conjunctiva/sclera: Conjunctivae normal.  Neck:     Trachea: No tracheal deviation.  Cardiovascular:     Rate and Rhythm: Normal rate and regular rhythm.     Pulses: Normal pulses.     Heart sounds: Normal heart sounds.  Pulmonary:     Effort: Pulmonary effort is normal. No respiratory distress.     Breath sounds: No stridor.  Chest:     Chest wall: No mass or tenderness.  Abdominal:     General: Abdomen is  protuberant. Bowel sounds are normal. There is no distension.     Palpations: Abdomen is soft. There is no mass or pulsatile mass.     Tenderness: There is no abdominal tenderness. There is no right CVA tenderness, left CVA tenderness, guarding or rebound.     Comments: Soft obese protuberant abdomen without tenderness to palpation today Patient is concerned about a solid mass in her left lower abdomen but it is her hip bone  Musculoskeletal:        General: Normal range of motion.  Skin:    General: Skin is warm and dry.     Findings: No rash.  Neurological:  Mental Status: She is alert.     Motor: No abnormal muscle tone.     Coordination: Coordination normal.  Psychiatric:        Mood and Affect: Mood is depressed. Affect is flat.        Behavior: Behavior normal.     Comments: Patient's mood and affect at baseline with poor eye contact      Results for orders placed or performed in visit on 06/05/23  POCT urinalysis dipstick   Collection Time: 06/05/23  2:11 PM  Result Value Ref Range   Color, UA Yellow    Clarity, UA Clear    Glucose, UA Negative Negative   Bilirubin, UA Negative    Ketones, UA Negative    Spec Grav, UA >=1.030 (A) 1.010 - 1.025   Blood, UA Negative    pH, UA 6.0 5.0 - 8.0   Protein, UA Negative Negative   Urobilinogen, UA 0.2 0.2 or 1.0 E.U./dL   Nitrite, UA Positive (A)    Leukocytes, UA Negative Negative   Appearance Normal    Odor None        Assessment & Plan:   1. Dysuria (Primary) Worse than baseline dysuria frequency urgency Patient does wished to wait for urine culture prior to taking antibiotics Urinalysis today is positive for nitrates but she has taken Azo in the last 24 hours so it may be a false positive With interstitial cystitis history difficult to determine if symptoms are a new bacterial infection -will follow culture - Urine Culture - POCT urinalysis dipstick  2. Gastroesophageal reflux disease, unspecified whether  esophagitis present Endorses various upper GI symptoms but has not taken the 20 mg Protonix per her PCP because she did not believe she had acid reflux I explained to her multiple symptoms that she can have which are consistent with GERD despite not possibly burping up or tasting acid With her various GI and abdominal complaints a day I did encourage the patient to take pantoprazole and a slightly higher dose was sent into the pharmacy for her - pantoprazole (PROTONIX) 40 MG tablet; Take 1 tablet (40 mg total) by mouth daily.  Dispense: 30 tablet; Refill: 3  3. vomiting, unspecified vomiting type 2 episodes of vomiting in the past week without any nausea Emesis both times was clear, without nausea or abdominal pain Encourage patient to take the Protonix daily for a few weeks and if she feels nauseous she can take Zofran as needed - pantoprazole (PROTONIX) 40 MG tablet; Take 1 tablet (40 mg total) by mouth daily.  Dispense: 30 tablet; Refill: 3 - ondansetron (ZOFRAN-ODT) 4 MG disintegrating tablet; Take 1 tablet (4 mg total) by mouth every 8 (eight) hours as needed for nausea or vomiting.  Dispense: 10 tablet; Refill: 0  4. Abdominal pain, unspecified abdominal location She complains of abdominal pain to her left lower quadrant and left upper quadrant or ribs Abdominal exam today is unremarkable without any tenderness to palpation Her left ribs and left upper quadrant were nontender, she was concerned about a mass to her left lower abdomen but this is her hip bone - pantoprazole (PROTONIX) 40 MG tablet; Take 1 tablet (40 mg total) by mouth daily.  Dispense: 30 tablet; Refill: 3 - ondansetron (ZOFRAN-ODT) 4 MG disintegrating tablet; Take 1 tablet (4 mg total) by mouth every 8 (eight) hours as needed for nausea or vomiting.  Dispense: 10 tablet; Refill: 0  5. Interstitial cystitis Hold antibiotics and would recommend if culture is positive  She may need to reestablish care with urology and follow-up  with her history of IC - she reports last Urology OV she declined meds and cystoscopy due to minimal sx at that time. - Urine Culture - POCT urinalysis dipstick      Danelle Berry, PA-C 06/05/23 2:27 PM

## 2023-06-07 ENCOUNTER — Other Ambulatory Visit: Payer: Self-pay | Admitting: Family Medicine

## 2023-06-07 DIAGNOSIS — N390 Urinary tract infection, site not specified: Secondary | ICD-10-CM

## 2023-06-07 LAB — URINE CULTURE
MICRO NUMBER:: 16102077
SPECIMEN QUALITY:: ADEQUATE

## 2023-06-07 MED ORDER — CEPHALEXIN 500 MG PO CAPS: 500.0000 mg | ORAL_CAPSULE | Freq: Two times a day (BID) | ORAL | 0 refills | Status: DC

## 2023-06-07 MED ORDER — CEPHALEXIN 500 MG PO CAPS: 500.0000 mg | ORAL_CAPSULE | Freq: Two times a day (BID) | ORAL | 0 refills | Status: AC

## 2023-06-07 NOTE — Progress Notes (Signed)
Meds not going to pharmacy electronically Resent Keflex Will have staff f/up and call in Rx if it fails again  1. Urinary tract infection without hematuria, site unspecified (Primary) - cephALEXin (KEFLEX) 500 MG capsule; Take 1 capsule (500 mg total) by mouth 2 (two) times daily for 5 days.  Dispense: 10 capsule; Refill: 0  Danelle Berry, PA-C 06/07/23 12:13 PM

## 2023-06-07 NOTE — Addendum Note (Signed)
Addended by: Davene Costain on: 06/07/2023 12:43 PM   Modules accepted: Orders

## 2023-06-08 ENCOUNTER — Ambulatory Visit: Payer: Self-pay | Admitting: Internal Medicine

## 2023-06-08 NOTE — Telephone Encounter (Signed)
Copied from CRM [id number]. Topic: Clinical - Medication Question >> Jun 08, 2023  9:49 AM Tiffany B wrote: Reason for CRM: Caller states the [facility] is not working and has not had a BM in 1 week, no stomach pain. Also taking the AZO for the urinary retention Sx, patient was seen on 06/05/2023  Danelle Berry, PA on 06/05/2023. Caller was inquiring about lab results as per chart final report has not came back yet.  Chief Complaint: no bowel movement Symptoms: no bowel movement  Frequency: 1 week Pertinent Negatives: Patient denies abd pain, abd discomfort, straining Disposition: [] ED /[] Urgent Care (no appt availability in office) / [] Appointment(In office/virtual)/ []  [place] [facility]/ [x] Home Care/ [] Refused Recommended Disposition /[] [place] [facility]/ []  Follow-up with PCP Additional Notes: pt stated had spoke with office earlier r/t no bowel movement & instructed to take miralax & pt did.  Pt stated still no bowel movement but no discomfort: pt stated thought urine issues may be causing delay with bowel.    Reason for Disposition  MILD constipation  Answer Assessment - Initial Assessment Questions 1. REASON FOR CALL or QUESTION: "What is your reason for calling today?" or "How can I best help you?" or "What question do you have that I can help answer?"     Patient called to find out urine lab results on 06/05/2023: nurse provided the following information per PCP:"The urine culture preliminary report shows bacterial growth so she does likely have a UTI I am sending in an antibiotic for her to start (keflex sent to her pharmacy on file) We will call her again when we get the final report.".  Nurse recommended patient to pick up Rx: pt stated she will pick up and start asap.  Also gave patient care advice regarding bowel movement.  Patient stated no discomfort only concerned about bowel movement because of how long its been since her last.  Patient did state she has taken miralax per her  PCP.  Answer Assessment - Initial Assessment Questions 1. STOOL PATTERN OR FREQUENCY: "How often do you have a bowel movement (BM)?"  (Normal range: 3 times a day to every 3 days)  "When was your last BM?"       Last BM 1 week 2. STRAINING: "Do you have to strain to have a BM?"      no 3. RECTAL PAIN: "Does your rectum hurt when the stool comes out?" If Yes, ask: "Do you have hemorrhoids? How bad is the pain?"  (Scale 1-10; or mild, moderate, severe)     no 4. STOOL COMPOSITION: "Are the stools hard?"      None in week 5. BLOOD ON STOOLS: "Has there been any blood on the toilet tissue or on the surface of the BM?" If Yes, ask: "When was the last time?"     N/a 6. CHRONIC CONSTIPATION: "Is this a new problem for you?"  If No, ask: "How long have you had this problem?" (days, weeks, months)      no 7. CHANGES IN DIET OR HYDRATION: "Have there been any recent changes in your diet?" "How much fluids are you drinking on a daily basis?"  "How much have you had to drink today?"     Eat a lot of fast foods lately 8. MEDICINES: "Have you been taking any new medicines?" "Are you taking any narcotic pain medicines?" (e.g., Dilaudid, morphine, Percocet, Vicondin)     N/a 9. LAXATIVES: "Have you been using any stool softeners, laxatives, or  enemas?"  If Yes, ask "What, how often, and when was the last time?"     Miralax only  10. ACTIVITY:  "How much walking do you do every day?"  "Has your activity level decreased in the past week?"        N/a 11. CAUSE: "What do you think is causing the constipation?"        unknown 12. OTHER SYMPTOMS: "Do you have any other symptoms?" (e.g., abdomen pain, bloating, fever, vomiting)       None discomfort  13. MEDICAL HISTORY: "Do you have a history of hemorrhoids, rectal fissures, or rectal surgery or rectal abscess?"         N/a 14. PREGNANCY: "Is there any chance you are pregnant?" "When was your last menstrual period?"       N/a  Protocols used:  Constipation-A-AH, Information Only Call - No Triage-A-AH

## 2023-06-11 ENCOUNTER — Other Ambulatory Visit: Payer: Self-pay | Admitting: Internal Medicine

## 2023-06-11 DIAGNOSIS — I10 Essential (primary) hypertension: Secondary | ICD-10-CM

## 2023-06-11 NOTE — Telephone Encounter (Signed)
 This is different than what she reported at her appt The urine results and abx were done Thursday KUB was ordered (did she complete the xray?)  If sx are different than when I saw her she should get an appt to f/up in office.

## 2023-06-12 NOTE — Telephone Encounter (Signed)
 Requested Prescriptions  Pending Prescriptions Disp Refills   bisoprolol-hydrochlorothiazide (ZIAC) 10-6.25 MG tablet [Pharmacy Med Name: BISOPROLOL/HCTZ 10MG /6.25MG  TABS] 90 tablet 0    Sig: TAKE 1 TABLET BY MOUTH DAILY     Cardiovascular: Beta Blocker + Diuretic Combos Failed - 06/12/2023 11:06 AM      Failed - K in normal range and within 180 days    Potassium  Date Value Ref Range Status  08/11/2022 4.3 3.5 - 5.3 mmol/L Final         Failed - Na in normal range and within 180 days    Sodium  Date Value Ref Range Status  08/11/2022 141 135 - 146 mmol/L Final         Failed - Cr in normal range and within 180 days    Creat  Date Value Ref Range Status  08/11/2022 1.17 (H) 0.60 - 1.00 mg/dL Final         Failed - eGFR in normal range and within 180 days    GFR, Est African American  Date Value Ref Range Status  11/12/2019 74 > OR = 60 mL/min/1.58m2 Final   GFR, Est Non African American  Date Value Ref Range Status  11/12/2019 64 > OR = 60 mL/min/1.76m2 Final   eGFR  Date Value Ref Range Status  08/11/2022 50 (L) > OR = 60 mL/min/1.71m2 Final         Failed - Valid encounter within last 6 months    Recent Outpatient Visits           7 months ago Acute cystitis without hematuria   [facility] Margarita Mail, DO   7 months ago Urinary frequency   [facility] Margarita Mail, DO   10 months ago Essential hypertension   [facility] Margarita Mail, DO   1 year ago Urinary frequency   [facility] Berniece Salines, FNP   1 year ago Essential hypertension   [facility] Margarita Mail, DO       Future Appointments             Tomorrow Alba Cory, MD [facility]            Passed - Last BP in normal range    BP Readings from Last 1 Encounters:  06/05/23 130/78         Passed  - Last Heart Rate in normal range    Pulse Readings from Last 1 Encounters:  06/05/23 69

## 2023-06-13 ENCOUNTER — Encounter: Payer: Self-pay | Admitting: Family Medicine

## 2023-06-13 ENCOUNTER — Ambulatory Visit (INDEPENDENT_AMBULATORY_CARE_PROVIDER_SITE_OTHER): Payer: Medicare Other | Admitting: Family Medicine

## 2023-06-13 VITALS — BP 122/80 | HR 81 | Resp 16 | Ht 65.0 in | Wt 206.0 lb

## 2023-06-13 DIAGNOSIS — E66811 Obesity, class 1: Secondary | ICD-10-CM

## 2023-06-13 DIAGNOSIS — N1831 Chronic kidney disease, stage 3a: Secondary | ICD-10-CM | POA: Diagnosis not present

## 2023-06-13 DIAGNOSIS — R634 Abnormal weight loss: Secondary | ICD-10-CM | POA: Diagnosis not present

## 2023-06-13 DIAGNOSIS — I700 Atherosclerosis of aorta: Secondary | ICD-10-CM | POA: Diagnosis not present

## 2023-06-13 DIAGNOSIS — R739 Hyperglycemia, unspecified: Secondary | ICD-10-CM

## 2023-06-13 DIAGNOSIS — F332 Major depressive disorder, recurrent severe without psychotic features: Secondary | ICD-10-CM

## 2023-06-13 DIAGNOSIS — K219 Gastro-esophageal reflux disease without esophagitis: Secondary | ICD-10-CM

## 2023-06-13 DIAGNOSIS — I10 Essential (primary) hypertension: Secondary | ICD-10-CM

## 2023-06-13 DIAGNOSIS — R0781 Pleurodynia: Secondary | ICD-10-CM

## 2023-06-13 DIAGNOSIS — E559 Vitamin D deficiency, unspecified: Secondary | ICD-10-CM

## 2023-06-13 DIAGNOSIS — R7303 Prediabetes: Secondary | ICD-10-CM

## 2023-06-13 NOTE — Addendum Note (Signed)
 Addended by: Ruel Favors on: 06/13/2023 01:33 PM   Modules accepted: Orders
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
## 2023-06-14 LAB — COMPLETE METABOLIC PANEL WITH GFR
AG Ratio: 1.5 (calc) (ref 1.0–2.5)
ALT: 14 U/L (ref 6–29)
AST: 15 U/L (ref 10–35)
Albumin: 4.3 g/dL (ref 3.6–5.1)
Alkaline phosphatase (APISO): 68 U/L (ref 37–153)
BUN/Creatinine Ratio: 15 (calc) (ref 6–22)
BUN: 19 mg/dL (ref 7–25)
CO2: 30 mmol/L (ref 20–32)
Calcium: 9.5 mg/dL (ref 8.6–10.4)
Chloride: 101 mmol/L (ref 98–110)
Creat: 1.31 mg/dL — ABNORMAL HIGH (ref 0.60–1.00)
Globulin: 2.9 g/dL (ref 1.9–3.7)
Glucose, Bld: 100 mg/dL — ABNORMAL HIGH (ref 65–99)
Potassium: 4.5 mmol/L (ref 3.5–5.3)
Sodium: 139 mmol/L (ref 135–146)
Total Bilirubin: 0.4 mg/dL (ref 0.2–1.2)
Total Protein: 7.2 g/dL (ref 6.1–8.1)
eGFR: 43 mL/min/{1.73_m2} — ABNORMAL LOW (ref >=60)

## 2023-06-14 LAB — CBC WITH DIFFERENTIAL/PLATELET
Absolute Lymphocytes: 2133 {cells}/uL (ref 850–3900)
Absolute Monocytes: 400 {cells}/uL (ref 200–950)
Basophils Absolute: 38 {cells}/uL (ref 0–200)
Basophils Relative: 0.7 %
Eosinophils Absolute: 70 {cells}/uL (ref 15–500)
Eosinophils Relative: 1.3 %
HCT: 45.4 % — ABNORMAL HIGH (ref 35.0–45.0)
Hemoglobin: 15 g/dL (ref 11.7–15.5)
MCH: 29.6 pg (ref 27.0–33.0)
MCHC: 33 g/dL (ref 32.0–36.0)
MCV: 89.7 fL (ref 80.0–100.0)
MPV: 10.9 fL (ref 7.5–12.5)
Monocytes Relative: 7.4 %
Neutro Abs: 2759 {cells}/uL (ref 1500–7800)
Neutrophils Relative %: 51.1 %
Platelets: 257 10*3/uL (ref 140–400)
RBC: 5.06 10*6/uL (ref 3.80–5.10)
RDW: 12.9 % (ref 11.0–15.0)
Total Lymphocyte: 39.5 %
WBC: 5.4 10*3/uL (ref 3.8–10.8)

## 2023-06-14 LAB — LIPID PANEL
Cholesterol: 207 mg/dL — ABNORMAL HIGH (ref <200)
HDL: 51 mg/dL (ref >=50)
LDL Cholesterol (Calc): 130 mg/dL — ABNORMAL HIGH
Non-HDL Cholesterol (Calc): 156 mg/dL — ABNORMAL HIGH (ref <130)
Total CHOL/HDL Ratio: 4.1 (calc) (ref <5.0)
Triglycerides: 144 mg/dL (ref <150)

## 2023-06-14 LAB — HEMOGLOBIN A1C
Hgb A1c MFr Bld: 6 %{Hb} — ABNORMAL HIGH (ref <5.7)
Mean Plasma Glucose: 126 mg/dL
eAG (mmol/L): 7 mmol/L

## 2023-06-14 LAB — VITAMIN D 25 HYDROXY (VIT D DEFICIENCY, FRACTURES): Vit D, 25-Hydroxy: 75 ng/mL (ref 30–100)

## 2023-06-14 LAB — TSH: TSH: 1.67 m[IU]/L (ref 0.40–4.50)

## 2023-07-04 ENCOUNTER — Other Ambulatory Visit: Payer: Self-pay | Admitting: Internal Medicine

## 2023-07-04 DIAGNOSIS — I10 Essential (primary) hypertension: Secondary | ICD-10-CM

## 2023-07-04 NOTE — Telephone Encounter (Unsigned)
 Copied from CRM [id number]. Topic: Clinical - Medication Refill >> Jul 04, 2023  1:23 PM Emylou G wrote: Most Recent Primary Care Visit:  Provider: Alba Cory  Department: [facility]  Visit Type: OFFICE VISIT  Date: 06/13/2023  Medication: bisoprolol-hydrochlorothiazide (ZIAC) 10-6.25 MG tablet  Has the patient contacted their pharmacy? Yes (Agent: If no, request that the patient contact the pharmacy for the refill. If patient does not wish to contact the pharmacy document the reason why and proceed with request.) (Agent: If yes, when and what did the pharmacy advise?) she wanted to call in case they didnt  Is this the correct pharmacy for this prescription? Yes If no, delete pharmacy and type the correct one.  This is the patient's preferred pharmacy:  [id number] - [place], [place] - [address] AT [facility] OF [address] [place] [address] Phone: [phone] Fax: [phone]   Has the prescription been filled recently? No  Is the patient out of the medication? Yes 4 left  Has the patient been seen for an appointment in the last year OR does the patient have an upcoming appointment? Yes  Can we respond through MyChart? No  Agent: Please be advised that Rx refills may take up to 3 business days. We ask that you follow-up with your pharmacy.

## 2023-07-05 NOTE — Telephone Encounter (Signed)
 Rx 06/12/23 #90- too soon Requested Prescriptions  Pending Prescriptions Disp Refills   bisoprolol-hydrochlorothiazide (ZIAC) 10-6.25 MG tablet 90 tablet 0    Sig: Take 1 tablet by mouth daily.     Cardiovascular: Beta Blocker + Diuretic Combos Failed - 07/05/2023  2:14 PM      Failed - Cr in normal range and within 180 days    Creat  Date Value Ref Range Status  06/13/2023 1.31 (H) 0.60 - 1.00 mg/dL Final         Failed - eGFR in normal range and within 180 days    GFR, Est African American  Date Value Ref Range Status  11/12/2019 74 > OR = 60 mL/min/1.60m2 Final   GFR, Est Non African American  Date Value Ref Range Status  11/12/2019 64 > OR = 60 mL/min/1.85m2 Final   eGFR  Date Value Ref Range Status  06/13/2023 43 (L) > OR = 60 mL/min/1.65m2 Final         Failed - Valid encounter within last 6 months    Recent Outpatient Visits           8 months ago Acute cystitis without hematuria   [facility] Margarita Mail, DO   8 months ago Urinary frequency   [facility] Margarita Mail, DO   10 months ago Essential hypertension   [facility] Margarita Mail, DO   1 year ago Urinary frequency   [facility] Berniece Salines, FNP   1 year ago Essential hypertension   [place] [facility] Margarita Mail, DO       Future Appointments             In 5 months Margarita Mail, DO [place] [facility], [facility]            Passed - K in normal range and within 180 days    Potassium  Date Value Ref Range Status  06/13/2023 4.5 3.5 - 5.3 mmol/L Final         Passed - Na in normal range and within 180 days    Sodium  Date Value Ref Range Status  06/13/2023 139 135 - 146 mmol/L Final         Passed - Last BP in normal range    BP Readings from Last 1 Encounters:  06/13/23 122/80         Passed - Last Heart Rate in  normal range    Pulse Readings from Last 1 Encounters:  06/13/23 81

## 2023-07-09 ENCOUNTER — Other Ambulatory Visit: Payer: Self-pay | Admitting: Internal Medicine

## 2023-07-09 DIAGNOSIS — I10 Essential (primary) hypertension: Secondary | ICD-10-CM

## 2023-07-09 NOTE — Telephone Encounter (Signed)
 Copied from CRM [id number]. Topic: Clinical - Medication Refill >> Jul 09, 2023  1:54 PM Gildardo Pounds wrote: Most Recent Primary Care Visit:  Provider: Alba Cory  Department: [facility]  Visit Type: OFFICE VISIT  Date: 06/13/2023  Medication: bisoprolol-hydrochlorothiazide (ZIAC) 10-6.25 MG tablet  Has the patient contacted their pharmacy? Yes (Agent: If no, request that the patient contact the pharmacy for the refill. If patient does not wish to contact the pharmacy document the reason why and proceed with request.) (Agent: If yes, when and what did the pharmacy advise?)pharmacy does not have prescription  Is this the correct pharmacy for this prescription? Yes If no, delete pharmacy and type the correct one.  This is the patient's preferred pharmacy:  [facility] #[id number] - [place], [place] - [address] AT [facility] OF [address] [place] [address] [place] [address] Phone: [phone] Fax: [phone]   Has the prescription been filled recently? No  Is the patient out of the medication? No  Has the patient been seen for an appointment in the last year OR does the patient have an upcoming appointment? Yes  Can we respond through MyChart? No  Agent: Please be advised that Rx refills may take up to 3 business days. We ask that you follow-up with your pharmacy.

## 2023-07-11 NOTE — Telephone Encounter (Signed)
 Rx 06/12/23 #90- too soon Requested Prescriptions  Pending Prescriptions Disp Refills   bisoprolol-hydrochlorothiazide (ZIAC) 10-6.25 MG tablet 90 tablet 0    Sig: Take 1 tablet by mouth daily.     Cardiovascular: Beta Blocker + Diuretic Combos Failed - 07/11/2023  8:34 AM      Failed - Cr in normal range and within 180 days    Creat  Date Value Ref Range Status  06/13/2023 1.31 (H) 0.60 - 1.00 mg/dL Final         Failed - eGFR in normal range and within 180 days    GFR, Est African American  Date Value Ref Range Status  11/12/2019 74 > OR = 60 mL/min/1.11m2 Final   GFR, Est Non African American  Date Value Ref Range Status  11/12/2019 64 > OR = 60 mL/min/1.11m2 Final   eGFR  Date Value Ref Range Status  06/13/2023 43 (L) > OR = 60 mL/min/1.74m2 Final         Failed - Valid encounter within last 6 months    Recent Outpatient Visits           8 months ago Acute cystitis without hematuria   [facility] Margarita Mail, DO   8 months ago Urinary frequency   [facility] Margarita Mail, DO   11 months ago Essential hypertension   [facility] Margarita Mail, DO   1 year ago Urinary frequency   [facility] Berniece Salines, FNP   1 year ago Essential hypertension   [facility] Margarita Mail, DO       Future Appointments             In 5 months Margarita Mail, DO  [facility], [facility]            Passed - K in normal range and within 180 days    Potassium  Date Value Ref Range Status  06/13/2023 4.5 3.5 - 5.3 mmol/L Final         Passed - Na in normal range and within 180 days    Sodium  Date Value Ref Range Status  06/13/2023 139 135 - 146 mmol/L Final         Passed - Last BP in normal range    BP Readings from Last 1 Encounters:  06/13/23 122/80         Passed - Last Heart Rate in  normal range    Pulse Readings from Last 1 Encounters:  06/13/23 81

## 2023-07-20 DIAGNOSIS — F332 Major depressive disorder, recurrent severe without psychotic features: Secondary | ICD-10-CM | POA: Diagnosis not present

## 2023-07-20 DIAGNOSIS — F603 Borderline personality disorder: Secondary | ICD-10-CM | POA: Diagnosis not present

## 2023-07-20 DIAGNOSIS — F411 Generalized anxiety disorder: Secondary | ICD-10-CM | POA: Diagnosis not present

## 2023-08-30 ENCOUNTER — Other Ambulatory Visit: Payer: Self-pay | Admitting: Internal Medicine

## 2023-08-30 DIAGNOSIS — R112 Nausea with vomiting, unspecified: Secondary | ICD-10-CM

## 2023-08-30 DIAGNOSIS — K219 Gastro-esophageal reflux disease without esophagitis: Secondary | ICD-10-CM

## 2023-08-30 DIAGNOSIS — R109 Unspecified abdominal pain: Secondary | ICD-10-CM

## 2023-09-03 NOTE — Telephone Encounter (Signed)
 Requested Prescriptions  Pending Prescriptions Disp Refills   pantoprazole  (PROTONIX ) 40 MG tablet [Pharmacy Med Name: PANTOPRAZOLE  40MG  TABLETS] 90 tablet 1    Sig: TAKE 1 TABLET(40 MG) BY MOUTH DAILY     Gastroenterology: Proton Pump Inhibitors Passed - 09/03/2023 11:34 AM      Passed - Valid encounter within last 12 months    Recent Outpatient Visits           2 months ago Stage 3a chronic kidney disease [facility])   [place] [facility] Arleen Lacer, MD   3 months ago Dysuria   [facility] Adeline Hone, [place]       Future Appointments             In 3 months Rockney Cid, DO [facility], [facility]

## 2023-09-28 ENCOUNTER — Other Ambulatory Visit: Payer: Self-pay | Admitting: Internal Medicine

## 2023-09-28 DIAGNOSIS — I10 Essential (primary) hypertension: Secondary | ICD-10-CM

## 2023-10-01 NOTE — Telephone Encounter (Signed)
 Requested Prescriptions  Pending Prescriptions Disp Refills   bisoprolol -hydrochlorothiazide  (ZIAC ) 10-6.25 MG tablet [Pharmacy Med Name: BISOPROLOL /HCTZ 10MG /6.25MG  TABS] 90 tablet 0    Sig: TAKE 1 TABLET BY MOUTH EVERY DAY     Cardiovascular: Beta Blocker + Diuretic Combos Failed - 10/01/2023  1:19 PM      Failed - Cr in normal range and within 180 days    Creat  Date Value Ref Range Status  06/13/2023 1.31 (H) 0.60 - 1.00 mg/dL Final         Failed - eGFR in normal range and within 180 days    GFR, Est African American  Date Value Ref Range Status  11/12/2019 74 > OR = 60 mL/min/1.15m2 Final   GFR, Est Non African American  Date Value Ref Range Status  11/12/2019 64 > OR = 60 mL/min/1.58m2 Final   eGFR  Date Value Ref Range Status  06/13/2023 43 (L) > OR = 60 mL/min/1.58m2 Final         Passed - K in normal range and within 180 days    Potassium  Date Value Ref Range Status  06/13/2023 4.5 3.5 - 5.3 mmol/L Final         Passed - Na in normal range and within 180 days    Sodium  Date Value Ref Range Status  06/13/2023 139 135 - 146 mmol/L Final         Passed - Last BP in normal range    BP Readings from Last 1 Encounters:  06/13/23 122/80         Passed - Last Heart Rate in normal range    Pulse Readings from Last 1 Encounters:  06/13/23 81         Passed - Valid encounter within last 6 months    Recent Outpatient Visits           3 months ago Stage 3a chronic kidney disease (HCC)   [place] [facility] Arleen Lacer, MD   3 months ago Dysuria   [facility] Adeline Hone, [place]       Future Appointments             In 2 months Rockney Cid, DO [facility], [facility]

## 2023-10-11 ENCOUNTER — Ambulatory Visit: Payer: Self-pay

## 2023-10-11 ENCOUNTER — Telehealth: Payer: Self-pay | Admitting: Internal Medicine

## 2023-10-11 NOTE — Telephone Encounter (Signed)
 Tried to contact pt. Phone just rung then said call cannot be completed at this time. Pt will need appointment

## 2023-10-11 NOTE — Telephone Encounter (Signed)
 Needs appt

## 2023-10-11 NOTE — Telephone Encounter (Signed)
 Reason for Disposition . [1] Mild diarrhea (e.g., 1-3 or more stools than normal in past 24 hours) without known cause AND [2] present >  7 days  Answer Assessment - Initial Assessment Questions 1. DIARRHEA SEVERITY: How bad is the diarrhea? How many more stools have you had in the past 24 hours than normal?    - NO DIARRHEA (SCALE 0)   - MILD (SCALE 1-3): Few loose or mushy BMs; increase of 1-3 stools over normal daily number of stools; mild increase in ostomy output.   -  MODERATE (SCALE 4-7): Increase of 4-6 stools daily over normal; moderate increase in ostomy output.   -  SEVERE (SCALE 8-10; OR WORST POSSIBLE): Increase of 7 or more stools daily over normal; moderate increase in ostomy output; incontinence.     Several times during night 2. ONSET: When did the diarrhea begin?      3-4 months ago  3. BM CONSISTENCY: How loose or watery is the diarrhea?      watery 4. VOMITING: Are you also vomiting? If Yes, ask: How many times in the past 24 hours?      Denies  5. ABDOMEN PAIN: Are you having any abdomen pain? If Yes, ask: What does it feel like? (e.g., crampy, dull, intermittent, constant)      denies 6. ABDOMEN PAIN SEVERITY: If present, ask: How bad is the pain?  (e.g., Scale 1-10; mild, moderate, or severe)   - MILD (1-3): doesn't interfere with normal activities, abdomen soft and not tender to touch    - MODERATE (4-7): interferes with normal activities or awakens from sleep, abdomen tender to touch    - SEVERE (8-10): excruciating pain, doubled over, unable to do any normal activities       Denies  7. ORAL INTAKE: If vomiting, Have you been able to drink liquids? How much liquids have you had in the past 24 hours?     Little bit of water/gatorade 8. HYDRATION: Any signs of dehydration? (e.g., dry mouth [not just dry lips], too weak to stand, dizziness, new weight loss) When did you last urinate?     Dry mouth   10. ANTIBIOTIC USE: Are you taking  antibiotics now or have you taken antibiotics in the past 2 months?       Na  11. OTHER SYMPTOMS: Do you have any other symptoms? (e.g., fever, blood in stool)       Constipated, belching, gas  Every several weeks pt has explosive diarrhea. I dont know its coming. Pt take Imodium at times and it does help. Pt has started taking Magnesium and feels this could be cause. Pt is very depressed and eats lots of processed foods. I sleep most of day. I hardly eat but when I do I eat microwave dinners.  RN suggested pt increase water intake. No appt until 7/8. Out of window to schedule. Please advise.  Protocols used: [facility]

## 2023-10-11 NOTE — Telephone Encounter (Unsigned)
 Copied from CRM [id number]. Topic: Clinical - Pink Word Triage >> Oct 11, 2023 12:17 PM Donee H wrote: Reason for Triage: Patient calling stating she is experiencing diarrhea.She states this has been an ongoing thing for about 4 months but has gotten worse over the past 2 weeks. She states she is afraid to eat because it runs through her. She wanted to either set up an appointment with doctor or get a referral for a gastroenterologist. Patient states good callback number is [phone]

## 2023-10-11 NOTE — Telephone Encounter (Signed)
         FYI Only or Action Required?: Action required by provider: request for appointment.  Patient was last seen in primary care on 06/13/2023 by Glenard Mire, MD. Called Nurse Triage reporting Diarrhea. Symptoms began several months ago. Interventions attempted: OTC medications: imodium . Symptoms are: unchanged.  Triage Disposition: See PCP When Office is Open (Within 3 Days)  Patient/caregiver understands and will follow disposition?: Unsure

## 2023-10-15 ENCOUNTER — Ambulatory Visit (INDEPENDENT_AMBULATORY_CARE_PROVIDER_SITE_OTHER): Admitting: Nurse Practitioner

## 2023-10-15 ENCOUNTER — Encounter: Payer: Self-pay | Admitting: Nurse Practitioner

## 2023-10-15 ENCOUNTER — Telehealth: Payer: Self-pay | Admitting: Sleep Medicine

## 2023-10-15 VITALS — BP 134/84 | HR 86 | Resp 16 | Ht 65.0 in | Wt 208.0 lb

## 2023-10-15 DIAGNOSIS — R197 Diarrhea, unspecified: Secondary | ICD-10-CM | POA: Diagnosis not present

## 2023-10-15 DIAGNOSIS — F332 Major depressive disorder, recurrent severe without psychotic features: Secondary | ICD-10-CM | POA: Diagnosis not present

## 2023-10-15 DIAGNOSIS — R4189 Other symptoms and signs involving cognitive functions and awareness: Secondary | ICD-10-CM | POA: Diagnosis not present

## 2023-10-15 DIAGNOSIS — R443 Hallucinations, unspecified: Secondary | ICD-10-CM

## 2023-10-15 DIAGNOSIS — F411 Generalized anxiety disorder: Secondary | ICD-10-CM | POA: Diagnosis not present

## 2023-10-15 DIAGNOSIS — F514 Sleep terrors [night terrors]: Secondary | ICD-10-CM

## 2023-10-15 DIAGNOSIS — R6889 Other general symptoms and signs: Secondary | ICD-10-CM

## 2023-10-15 DIAGNOSIS — G4730 Sleep apnea, unspecified: Secondary | ICD-10-CM

## 2023-10-15 DIAGNOSIS — G250 Essential tremor: Secondary | ICD-10-CM

## 2023-10-15 NOTE — Telephone Encounter (Unsigned)
 Copied from CRM [id number]. Topic: Medical Record Request - Records Request >> Oct 15, 2023 10:32 AM Zebedee SAUNDERS wrote: Reason for CRM: Received call from [facility] Per [place] ph: [phone] fax: [phone], needing records for [facility], The request for MRN: [id number] . >> Oct 15, 2023 10:36 AM Zebedee SAUNDERS wrote: Records for Breast Cancer Screening. Fax: [phone]

## 2023-10-15 NOTE — Telephone Encounter (Signed)
 LVMTCB to schedule sleep consult.

## 2023-10-15 NOTE — Telephone Encounter (Signed)
 Will need proper medical release form faxed

## 2023-10-15 NOTE — Progress Notes (Signed)
 BP 134/84   Pulse 86   Resp 16   Ht 5' 5 (1.651 m)   Wt 208 lb (94.3 kg)   SpO2 93%   BMI 34.61 kg/m    Subjective:    Patient ID: Katherine Gould, female    DOB: [birth year], 73 y.o.   MRN: [id number]  HPI: Katherine Gould is a 73 y.o. female  Chief Complaint  Patient presents with   Diarrhea    3 months, on/off watery     Discussed the use of AI scribe software for clinical note transcription with the patient, who gave verbal consent to proceed.  History of Present Illness Katherine Gould is a 73 year old female with interstitial cystitis and anxiety who presents with chronic watery diarrhea.  She has been experiencing watery diarrhea for several months, describing it as 'like water' and noting its frequent occurrence, which causes significant anxiety and distress. An episode was so severe it felt like a 'lava flow,' requiring immediate cleaning. No abdominal pain is associated with the diarrhea. Her last colonoscopy was on April 23, 2015. She has used Pepto-Bismol and Imodium with limited relief.  She has a history of interstitial cystitis and urinary incontinence, characterized by sudden urges to urinate without warning. She also mentions a history of bladder 'retouching.'  Her dietary habits include irregular eating patterns, often going 10-12 hours without food and consuming chocolate as a coping mechanism for depression. She suspects possible lactose intolerance but is unsure as not all dairy products affect her.  She experiences significant anxiety and depression, often staying in bed and feeling overwhelmed by daily tasks. She has a history of PTSD and has undergone various treatments, including TMS and acupuncture, with limited success. She reports nightmares and hallucinations, describing them as 'night terrors' and seeing things on the wall. She has been taking magnesium glycinate but stopped two and a half weeks ago due to concerns about it causing diarrhea. She has also been  taking D3 and a super B complex.  She has a history of bone-on-bone knees and experiences difficulty with mobility, impacting her ability to perform daily activities. She has tried physical therapy in the past but reports that her cartilage is now gone. She experiences tremors, which she attributes to past use of Abilify, and describes them as benign but bothersome.  Her family history includes depression in her mother and hypochondriasis in her sister. She has a history of OCD, particularly with skin picking, and has experienced significant mental health challenges over the years.         10/15/2023   10:04 AM 06/13/2023   12:57 PM 10/25/2022    9:10 AM  Depression screen PHQ 2/9  Decreased Interest 3 3 0  Down, Depressed, Hopeless 3 3 0  PHQ - 2 Score 6 6 0  Altered sleeping 3 3 0  Tired, decreased energy 3 3 0  Change in appetite 3 3 0  Feeling bad or failure about yourself  3 3 0  Trouble concentrating 3 3 0  Moving slowly or fidgety/restless 0 3 0  Suicidal thoughts 3 3 0  PHQ-9 Score 24 27 0  Difficult doing work/chores Very difficult Extremely dIfficult Not difficult at all       10/15/2023   10:05 AM 06/13/2023   12:57 PM 08/11/2022   11:38 AM 11/14/2021    3:55 PM  GAD 7 : Generalized Anxiety Score  Nervous, Anxious, on Edge 3 3 3 3   Control/stop  worrying 3 3 3 3   Worry too much - different things 3 3 3 3   Trouble relaxing 3 3 3 3   Restless 3 3 3 3   Easily annoyed or irritable 3 3 3 3   Afraid - awful might happen 3 3 3 3   Total GAD 7 Score 21 21 21 21   Anxiety Difficulty Extremely difficult Extremely difficult Somewhat difficult Very difficult     Relevant past medical, surgical, family and social history reviewed and updated as indicated. Interim medical history since our last visit reviewed. Allergies and medications reviewed and updated.  Review of Systems  Ten systems reviewed and is negative except as mentioned in HPI      Objective:     BP 134/84    Pulse 86   Resp 16   Ht 5' 5 (1.651 m)   Wt 208 lb (94.3 kg)   SpO2 93%   BMI 34.61 kg/m    Wt Readings from Last 3 Encounters:  10/15/23 208 lb (94.3 kg)  06/13/23 206 lb (93.4 kg)  06/05/23 205 lb (93 kg)    Physical Exam Physical Exam GENERAL: Alert, cooperative, well developed, no acute distress HEENT: Normocephalic, normal oropharynx, moist mucous membranes CHEST: Clear to auscultation bilaterally, no wheezes, rhonchi, or crackles CARDIOVASCULAR: Normal heart rate and rhythm, S1 and S2 normal without murmurs ABDOMEN: Soft, non-tender, non-distended, without organomegaly, normal bowel sounds EXTREMITIES: No cyanosis or edema NEUROLOGICAL: Cranial nerves grossly intact, moves all extremities without gross motor or sensory deficit   Results for orders placed or performed in visit on 06/13/23  Lipid panel   Collection Time: 06/13/23  1:44 PM  Result Value Ref Range   Cholesterol 207 (H) <200 mg/dL   HDL 51 > OR = 50 mg/dL   Triglycerides 855 <849 mg/dL   LDL Cholesterol (Calc) 130 (H) mg/dL (calc)   Total CHOL/HDL Ratio 4.1 <5.0 (calc)   Non-HDL Cholesterol (Calc) 156 (H) <130 mg/dL (calc)  CBC with Differential/Platelet   Collection Time: 06/13/23  1:44 PM  Result Value Ref Range   WBC 5.4 3.8 - 10.8 Thousand/uL   RBC 5.06 3.80 - 5.10 Million/uL   Hemoglobin 15.0 11.7 - 15.5 g/dL   HCT 54.5 (H) 64.9 - 54.9 %   MCV 89.7 80.0 - 100.0 fL   MCH 29.6 27.0 - 33.0 pg   MCHC 33.0 32.0 - 36.0 g/dL   RDW 87.0 88.9 - 84.9 %   Platelets 257 140 - 400 Thousand/uL   MPV 10.9 7.5 - 12.5 fL   Neutro Abs 2,759 1,500 - 7,800 cells/uL   Absolute Lymphocytes 2,133 850 - 3,900 cells/uL   Absolute Monocytes 400 200 - 950 cells/uL   Eosinophils Absolute 70 15 - 500 cells/uL   Basophils Absolute 38 0 - 200 cells/uL   Neutrophils Relative % 51.1 %   Total Lymphocyte 39.5 %   Monocytes Relative 7.4 %   Eosinophils Relative 1.3 %   Basophils Relative 0.7 %  COMPLETE METABOLIC PANEL  WITH GFR   Collection Time: 06/13/23  1:44 PM  Result Value Ref Range   Glucose, Bld 100 (H) 65 - 99 mg/dL   BUN 19 7 - 25 mg/dL   Creat 8.68 (H) 9.39 - 1.00 mg/dL   eGFR 43 (L) > OR = 60 mL/min/1.59m2   BUN/Creatinine Ratio 15 6 - 22 (calc)   Sodium 139 135 - 146 mmol/L   Potassium 4.5 3.5 - 5.3 mmol/L   Chloride 101 98 - 110 mmol/L  CO2 30 20 - 32 mmol/L   Calcium  9.5 8.6 - 10.4 mg/dL   Total Protein 7.2 6.1 - 8.1 g/dL   Albumin 4.3 3.6 - 5.1 g/dL   Globulin 2.9 1.9 - 3.7 g/dL (calc)   AG Ratio 1.5 1.0 - 2.5 (calc)   Total Bilirubin 0.4 0.2 - 1.2 mg/dL   Alkaline phosphatase (APISO) 68 37 - 153 U/L   AST 15 10 - 35 U/L   ALT 14 6 - 29 U/L  Hemoglobin A1c   Collection Time: 06/13/23  1:44 PM  Result Value Ref Range   Hgb A1c MFr Bld 6.0 (H) <5.7 % of total Hgb   Mean Plasma Glucose 126 mg/dL   eAG (mmol/L) 7.0 mmol/L  VITAMIN D  25 Hydroxy (Vit-D Deficiency, Fractures)   Collection Time: 06/13/23  1:44 PM  Result Value Ref Range   Vit D, 25-Hydroxy 75 30 - 100 ng/mL  TSH   Collection Time: 06/13/23  1:44 PM  Result Value Ref Range   TSH 1.67 0.40 - 4.50 mIU/L          Assessment & Plan:   Problem List Items Addressed This Visit       Other   Severe episode of recurrent major depressive disorder, without psychotic features (HCC)   GAD (generalized anxiety disorder)   Other Visit Diagnoses       Diarrhea, unspecified type    -  Primary   Relevant Orders   CBC with Differential/Platelet   Comprehensive metabolic panel with GFR   Salmonella/Shigella Cult, Campy EIA and Shiga Toxin reflex   Clostridium difficile culture-fecal   Stool Giardia/Cryptosporidium   Ova and parasite examination   CALPROTECTIN   Celiac Disease Panel   Sed Rate (ESR)   C-reactive protein   TSH     Hallucinations       Relevant Orders   MR Brain Wo Contrast   Ambulatory referral to Neurology   Ambulatory referral to Pulmonology   RPR   Vitamin B12   VITAMIN D  25 Hydroxy  (Vit-D Deficiency, Fractures)     Night terrors, adult       Relevant Orders   MR Brain Wo Contrast   Ambulatory referral to Neurology   Ambulatory referral to Pulmonology   RPR   Vitamin B12   VITAMIN D  25 Hydroxy (Vit-D Deficiency, Fractures)     Brain fog       Relevant Orders   MR Brain Wo Contrast   Ambulatory referral to Neurology   Ambulatory referral to Pulmonology   RPR   Vitamin B12   VITAMIN D  25 Hydroxy (Vit-D Deficiency, Fractures)     Sleep apnea, unspecified type       Relevant Orders   Ambulatory referral to Pulmonology     Forgetfulness       Relevant Orders   MR Brain Wo Contrast   Ambulatory referral to Neurology   Ambulatory referral to Pulmonology   RPR   Vitamin B12   VITAMIN D  25 Hydroxy (Vit-D Deficiency, Fractures)     Essential tremor            Assessment and Plan Assessment & Plan Chronic watery diarrhea Chronic watery diarrhea for several months without abdominal pain. Possible contributing factors include dietary habits (chocolate consumption) and magnesium supplementation. Differential diagnosis includes infection, malabsorption, or irritable bowel syndrome. - Order blood work and provide stool sample kit for stool culture  Major depressive disorder and generalized anxiety disorder Long-standing major depressive disorder  and generalized anxiety disorder with severe anxiety, depression, and reluctance to start new medications due to past adverse effects. Current medications include Effexor and Klonopin. History of unsuccessful trials with other medications and therapies. Discussed gene site testing to guide medication management. - Discuss gene site testing to guide medication management - Referral to neurology for further evaluation  Obsessive-compulsive disorder with skin picking OCD with compulsive skin picking behavior causing distress.  Night terrors and hallucinations Experiencing night terrors and hallucinations with vivid  nightmares and visual hallucinations. Possible contributing factors include medication side effects and sleep disturbances. - Order MRI of the brain - Referral to neurology for further evaluation  Tremor, likely tardive dyskinesia from antipsychotic use Tremor likely secondary to tardive dyskinesia from past antipsychotic use, with symptoms including tremors in the hands and lips, previously associated with Abilify use.  Bilateral primary osteoarthritis of knees Bilateral knee pain due to osteoarthritis, with difficulty walking and getting up, exacerbated by lack of muscle strength and physical activity. She is hesitant about surgical intervention due to anxiety and depression. Conservative management is preferred. - Continue conservative management with pain relief measures such as Tylenol, braces, and topical treatments  Mild obstructive sleep apnea Mild obstructive sleep apnea diagnosed approximately ten years ago with current symptoms of dry mouth and possible sleep disturbances. - Referral to pulmonology for repeat sleep study        Follow up plan: Return if symptoms worsen or fail to improve.

## 2023-10-16 ENCOUNTER — Other Ambulatory Visit (HOSPITAL_COMMUNITY): Payer: Self-pay

## 2023-10-16 ENCOUNTER — Ambulatory Visit: Payer: Self-pay | Admitting: Nurse Practitioner

## 2023-10-16 LAB — C-REACTIVE PROTEIN: CRP: 5.6 mg/L (ref <8.0)

## 2023-10-16 LAB — CBC WITH DIFFERENTIAL/PLATELET
Absolute Lymphocytes: 1956 {cells}/uL (ref 850–3900)
Absolute Monocytes: 360 {cells}/uL (ref 200–950)
Basophils Absolute: 48 {cells}/uL (ref 0–200)
Basophils Relative: 0.8 %
Eosinophils Absolute: 168 {cells}/uL (ref 15–500)
Eosinophils Relative: 2.8 %
HCT: 42.5 % (ref 35.0–45.0)
Hemoglobin: 14.1 g/dL (ref 11.7–15.5)
MCH: 31 pg (ref 27.0–33.0)
MCHC: 33.2 g/dL (ref 32.0–36.0)
MCV: 93.4 fL (ref 80.0–100.0)
MPV: 10.5 fL (ref 7.5–12.5)
Monocytes Relative: 6 %
Neutro Abs: 3468 {cells}/uL (ref 1500–7800)
Neutrophils Relative %: 57.8 %
Platelets: 239 10*3/uL (ref 140–400)
RBC: 4.55 10*6/uL (ref 3.80–5.10)
RDW: 13.7 % (ref 11.0–15.0)
Total Lymphocyte: 32.6 %
WBC: 6 10*3/uL (ref 3.8–10.8)

## 2023-10-16 LAB — COMPREHENSIVE METABOLIC PANEL WITH GFR
AG Ratio: 1.7 (calc) (ref 1.0–2.5)
ALT: 13 U/L (ref 6–29)
AST: 15 U/L (ref 10–35)
Albumin: 4.3 g/dL (ref 3.6–5.1)
Alkaline phosphatase (APISO): 66 U/L (ref 37–153)
BUN: 21 mg/dL (ref 7–25)
CO2: 27 mmol/L (ref 20–32)
Calcium: 9.5 mg/dL (ref 8.6–10.4)
Chloride: 103 mmol/L (ref 98–110)
Creat: 0.95 mg/dL (ref 0.60–1.00)
Globulin: 2.6 g/dL (ref 1.9–3.7)
Glucose, Bld: 104 mg/dL — ABNORMAL HIGH (ref 65–99)
Potassium: 4.7 mmol/L (ref 3.5–5.3)
Sodium: 140 mmol/L (ref 135–146)
Total Bilirubin: 0.5 mg/dL (ref 0.2–1.2)
Total Protein: 6.9 g/dL (ref 6.1–8.1)
eGFR: 64 mL/min/{1.73_m2} (ref >=60)

## 2023-10-16 LAB — VITAMIN B12: Vitamin B-12: 549 pg/mL (ref 200–1100)

## 2023-10-16 LAB — VITAMIN D 25 HYDROXY (VIT D DEFICIENCY, FRACTURES): Vit D, 25-Hydroxy: 89 ng/mL (ref 30–100)

## 2023-10-16 LAB — CELIAC DISEASE PANEL
(tTG) Ab, IgA: <1 U/mL
(tTG) Ab, IgG: <1 U/mL
Gliadin IgA: <1 U/mL
Gliadin IgG: <1 U/mL
Immunoglobulin A: 344 mg/dL — ABNORMAL HIGH (ref 70–320)

## 2023-10-16 LAB — RPR: RPR Ser Ql: NONREACTIVE

## 2023-10-16 LAB — SEDIMENTATION RATE: Sed Rate: 28 mm/h (ref 0–30)

## 2023-10-16 LAB — TSH: TSH: 1.31 m[IU]/L (ref 0.40–4.50)

## 2023-10-23 NOTE — Telephone Encounter (Signed)
 LVMTCB to schedule sleep consult.

## 2023-10-26 ENCOUNTER — Ambulatory Visit (INDEPENDENT_AMBULATORY_CARE_PROVIDER_SITE_OTHER): Admitting: Nurse Practitioner

## 2023-10-26 ENCOUNTER — Encounter: Payer: Self-pay | Admitting: Nurse Practitioner

## 2023-10-26 VITALS — BP 132/78 | HR 79 | Temp 97.6°F | Ht 65.0 in | Wt 209.5 lb

## 2023-10-26 DIAGNOSIS — F332 Major depressive disorder, recurrent severe without psychotic features: Secondary | ICD-10-CM | POA: Diagnosis not present

## 2023-10-26 DIAGNOSIS — F411 Generalized anxiety disorder: Secondary | ICD-10-CM | POA: Diagnosis not present

## 2023-10-26 NOTE — Progress Notes (Signed)
 BP 132/78 (BP Location: Left Arm, Patient Position: Sitting, Cuff Size: Normal)   Pulse 79   Temp 97.6 F (36.4 C) (Oral)   Ht 5' 5 (1.651 m)   Wt 209 lb 8 oz (95 kg)   SpO2 95%   BMI 34.86 kg/m    Subjective:    Patient ID: Katherine Gould, female    DOB: 12-19-1950, 72 y.o.   MRN: [id number]  HPI: Katherine Gould is a 73 y.o. female  Chief Complaint  Patient presents with   Results    Review genesight results     Discussed the use of AI scribe software for clinical note transcription with the patient, who gave verbal consent to proceed.  History of Present Illness Katherine Gould is a 73 year old female with depression and anxiety who presents for a follow-up visit.  She is currently taking Effexor 75 mg daily and Klonopin 1 mg twice a day. She has previously been on BuSpar, Cymbalta, and fluoxetine. She is managed by psychiatry and is currently in therapy. Gene site testing was conducted, and she is reviewing the results. She expressed concerns about the long-term effects of Effexor, noting that it is in the 'yellow pile' according to the gene site results. She has experienced 'brain zaps' with Effexor, described as feeling like an electrical shock up the back of her head, which is painful but brief.  She has a history of trying various medications, including Abilify, Seroquel, Celexa, Wellbutrin, Lexapro, Prozac, Gleevec, Paxil, Trintellix, and trazodone. She mentioned a bad reaction to Seroquel despite it being in the 'green' category in her gene site results. She also noted that Wellbutrin gave her bad headaches.  She has been referred for a sleep study and an MRI of her brain due to brain fog and hallucinations but has not yet scheduled these appointments. She is concerned about the potential withdrawal effects of her current medications, particularly Klonopin and Effexor, and is apprehensive about changing medications due to past adverse reactions.         10/15/2023   10:04  AM 06/13/2023   12:57 PM 10/25/2022    9:10 AM  Depression screen PHQ 2/9  Decreased Interest 3 3 0  Down, Depressed, Hopeless 3 3 0  PHQ - 2 Score 6 6 0  Altered sleeping 3 3 0  Tired, decreased energy 3 3 0  Change in appetite 3 3 0  Feeling bad or failure about yourself  3 3 0  Trouble concentrating 3 3 0  Moving slowly or fidgety/restless 0 3 0  Suicidal thoughts 3 3 0  PHQ-9 Score 24 27 0  Difficult doing work/chores Very difficult Extremely dIfficult Not difficult at all    Relevant past medical, surgical, family and social history reviewed and updated as indicated. Interim medical history since our last visit reviewed. Allergies and medications reviewed and updated.  Review of Systems  Ten systems reviewed and is negative except as mentioned in HPI      Objective:     BP 132/78 (BP Location: Left Arm, Patient Position: Sitting, Cuff Size: Normal)   Pulse 79   Temp 97.6 F (36.4 C) (Oral)   Ht 5' 5 (1.651 m)   Wt 209 lb 8 oz (95 kg)   SpO2 95%   BMI 34.86 kg/m    Wt Readings from Last 3 Encounters:  10/26/23 209 lb 8 oz (95 kg)  10/15/23 208 lb (94.3 kg)  06/13/23 206 lb (93.4 kg)  Physical Exam Physical Exam GENERAL: Alert, cooperative, well developed, no acute distress HEENT: Normocephalic, normal oropharynx, moist mucous membranes CHEST: Clear to auscultation bilaterally, No wheezes, rhonchi, or crackles CARDIOVASCULAR: Normal heart rate and rhythm, S1 and S2 normal without murmurs ABDOMEN: Soft, non-tender, non-distended, without organomegaly, Normal bowel sounds EXTREMITIES: No cyanosis or edema NEUROLOGICAL: Cranial nerves grossly intact, Moves all extremities without gross motor or sensory deficit   Results for orders placed or performed in visit on 10/15/23  CBC with Differential/Platelet   Collection Time: 10/15/23 11:00 AM  Result Value Ref Range   WBC 6.0 3.8 - 10.8 Thousand/uL   RBC 4.55 3.80 - 5.10 Million/uL   Hemoglobin 14.1 11.7 - 15.5  g/dL   HCT 57.4 64.9 - 54.9 %   MCV 93.4 80.0 - 100.0 fL   MCH 31.0 27.0 - 33.0 pg   MCHC 33.2 32.0 - 36.0 g/dL   RDW 86.2 88.9 - 84.9 %   Platelets 239 140 - 400 Thousand/uL   MPV 10.5 7.5 - 12.5 fL   Neutro Abs 3,468 1,500 - 7,800 cells/uL   Absolute Lymphocytes 1,956 850 - 3,900 cells/uL   Absolute Monocytes 360 200 - 950 cells/uL   Eosinophils Absolute 168 15 - 500 cells/uL   Basophils Absolute 48 0 - 200 cells/uL   Neutrophils Relative % 57.8 %   Total Lymphocyte 32.6 %   Monocytes Relative 6.0 %   Eosinophils Relative 2.8 %   Basophils Relative 0.8 %  Comprehensive metabolic panel with GFR   Collection Time: 10/15/23 11:00 AM  Result Value Ref Range   Glucose, Bld 104 (H) 65 - 99 mg/dL   BUN 21 7 - 25 mg/dL   Creat 9.04 9.39 - 8.99 mg/dL   eGFR 64 > OR = 60 fO/fpw/8.26f7   BUN/Creatinine Ratio SEE NOTE: 6 - 22 (calc)   Sodium 140 135 - 146 mmol/L   Potassium 4.7 3.5 - 5.3 mmol/L   Chloride 103 98 - 110 mmol/L   CO2 27 20 - 32 mmol/L   Calcium  9.5 8.6 - 10.4 mg/dL   Total Protein 6.9 6.1 - 8.1 g/dL   Albumin 4.3 3.6 - 5.1 g/dL   Globulin 2.6 1.9 - 3.7 g/dL (calc)   AG Ratio 1.7 1.0 - 2.5 (calc)   Total Bilirubin 0.5 0.2 - 1.2 mg/dL   Alkaline phosphatase (APISO) 66 37 - 153 U/L   AST 15 10 - 35 U/L   ALT 13 6 - 29 U/L  Celiac Disease Panel   Collection Time: 10/15/23 11:00 AM  Result Value Ref Range   Immunoglobulin A 344 (H) 70 - 320 mg/dL   Gliadin IgA <8.9 U/mL   Gliadin IgG <1.0 U/mL   (tTG) Ab, IgG <1.0 U/mL   (tTG) Ab, IgA <1.0 U/mL  Sed Rate (ESR)   Collection Time: 10/15/23 11:00 AM  Result Value Ref Range   Sed Rate 28 0 - 30 mm/h  C-reactive protein   Collection Time: 10/15/23 11:00 AM  Result Value Ref Range   CRP 5.6 <8.0 mg/L  TSH   Collection Time: 10/15/23 11:00 AM  Result Value Ref Range   TSH 1.31 0.40 - 4.50 mIU/L  VITAMIN D  25 Hydroxy (Vit-D Deficiency, Fractures)   Collection Time: 10/15/23 11:00 AM  Result Value Ref Range   Vit  D, 25-Hydroxy 89 30 - 100 ng/mL  Vitamin B12   Collection Time: 10/15/23 11:00 AM  Result Value Ref Range   Vitamin B-12 549 200 -  1,100 pg/mL  RPR   Collection Time: 10/15/23 11:00 AM  Result Value Ref Range   RPR Ser Ql NON-REACTIVE NON-REACTIVE          Assessment & Plan:   Problem List Items Addressed This Visit       Other   Severe episode of recurrent major depressive disorder, without psychotic features (HCC) - Primary   GAD (generalized anxiety disorder)     Assessment and Plan Assessment & Plan Major depressive disorder and generalized anxiety disorder Chronic major depressive disorder and generalized anxiety disorder, currently managed with Effexor 75 mg daily and Klonopin 1 mg twice daily. Gene site testing indicates Effexor is in the yellow category, suggesting a potential need for dose adjustment, while Klonopin is in the green category, indicating compatibility with her genetic profile. Concerns about withdrawal symptoms and adverse effects from medication changes, including brain zaps associated with Effexor. She expresses fear of medication changes due to past adverse reactions. Currently in therapy and managed by psychiatry. - Provide gene site testing results to psychiatry and therapist. - Discuss potential medication changes with psychiatry, including tapering off Effexor and starting Zoloft. - Educate on gene site testing results: green (safe), yellow (caution), red (avoid). - Encourage discussion with therapist about medication management and potential changes.  Hallucinations and brain fog Ongoing issues with hallucinations and brain fog. Previous referral for sleep study and MRI of the brain to investigate underlying causes. She has not yet scheduled these tests. - Provide contact information for scheduling sleep study and MRI. - Encourage scheduling of MRI to investigate hallucinations and brain fog.        Follow up plan: Return for already  scheduled.

## 2023-11-02 ENCOUNTER — Encounter: Payer: Self-pay | Admitting: Nurse Practitioner

## 2023-11-07 DIAGNOSIS — F332 Major depressive disorder, recurrent severe without psychotic features: Secondary | ICD-10-CM | POA: Diagnosis not present

## 2023-11-07 DIAGNOSIS — F411 Generalized anxiety disorder: Secondary | ICD-10-CM | POA: Diagnosis not present

## 2023-11-07 DIAGNOSIS — F603 Borderline personality disorder: Secondary | ICD-10-CM | POA: Diagnosis not present

## 2023-12-11 ENCOUNTER — Ambulatory Visit: Payer: Medicare Other | Admitting: Internal Medicine

## 2023-12-12 ENCOUNTER — Ambulatory Visit (INDEPENDENT_AMBULATORY_CARE_PROVIDER_SITE_OTHER): Admitting: Nurse Practitioner

## 2023-12-12 ENCOUNTER — Encounter: Payer: Self-pay | Admitting: Nurse Practitioner

## 2023-12-12 VITALS — BP 120/82 | HR 84 | Temp 98.0°F | Resp 18 | Ht 65.0 in | Wt 210.2 lb

## 2023-12-12 DIAGNOSIS — R443 Hallucinations, unspecified: Secondary | ICD-10-CM

## 2023-12-12 DIAGNOSIS — F901 Attention-deficit hyperactivity disorder, predominantly hyperactive type: Secondary | ICD-10-CM

## 2023-12-12 DIAGNOSIS — R4189 Other symptoms and signs involving cognitive functions and awareness: Secondary | ICD-10-CM

## 2023-12-12 DIAGNOSIS — I10 Essential (primary) hypertension: Secondary | ICD-10-CM

## 2023-12-12 DIAGNOSIS — R6889 Other general symptoms and signs: Secondary | ICD-10-CM

## 2023-12-12 DIAGNOSIS — R609 Edema, unspecified: Secondary | ICD-10-CM

## 2023-12-12 DIAGNOSIS — R232 Flushing: Secondary | ICD-10-CM

## 2023-12-12 DIAGNOSIS — K219 Gastro-esophageal reflux disease without esophagitis: Secondary | ICD-10-CM | POA: Diagnosis not present

## 2023-12-12 DIAGNOSIS — M170 Bilateral primary osteoarthritis of knee: Secondary | ICD-10-CM | POA: Diagnosis not present

## 2023-12-12 DIAGNOSIS — E7849 Other hyperlipidemia: Secondary | ICD-10-CM

## 2023-12-12 DIAGNOSIS — F424 Excoriation (skin-picking) disorder: Secondary | ICD-10-CM

## 2023-12-12 DIAGNOSIS — F411 Generalized anxiety disorder: Secondary | ICD-10-CM

## 2023-12-12 DIAGNOSIS — F429 Obsessive-compulsive disorder, unspecified: Secondary | ICD-10-CM

## 2023-12-12 DIAGNOSIS — G4733 Obstructive sleep apnea (adult) (pediatric): Secondary | ICD-10-CM | POA: Diagnosis not present

## 2023-12-12 DIAGNOSIS — F332 Major depressive disorder, recurrent severe without psychotic features: Secondary | ICD-10-CM

## 2023-12-12 MED ORDER — MUPIROCIN 2 % EX OINT: TOPICAL_OINTMENT | CUTANEOUS | 3 refills | Status: AC

## 2023-12-12 NOTE — Progress Notes (Signed)
 BP 120/82   Pulse 84   Temp 98 F (36.7 C)   Resp 18   Ht 5' 5 (1.651 m)   Wt 210 lb 3.2 oz (95.3 kg)   SpO2 92%   BMI 34.98 kg/m    Subjective:    Patient ID: Katherine Gould, female    DOB: 04/02/1951, 73 y.o.   MRN: [id number]  HPI: Katherine Gould is a 73 y.o. female  Chief Complaint  Patient presents with   Medical Management of Chronic Issues   Depression   lipedema    Pt wants consult about this    Discussed the use of AI scribe software for clinical note transcription with the patient, who gave verbal consent to proceed.  History of Present Illness Katherine Gould is a 73 year old female who presents for a six-month follow-up.  Cognitive impairment and hallucinations - Brain fog present - Visual hallucinations, including a recent episode involving a 'white origami figure' at the end of her bed, which resolved upon closer inspection - Previous referrals for sleep study and neurology consultation were closed due to communication issues - MRI has not been scheduled - patient declines further work up on this  Knee pain and impaired mobility - Chronic bilateral knee pain described as 'bone on bone' - Knee pain limits physical activity  Anxiety and hot spells - Episodes of localized heat described as hot spells - Hot spells are associated with agitation and occur during periods of anxiety or stress  Body morphology concerns and suspected lipedema - Interest in exploring treatment options for possible lipedema - Body described as asymmetrical with larger legs and a 'muffin top'  Dermatillomania (skin picking) - Long-standing issue with compulsive skin picking attributed to anxiety and OCD - Frequent picking leads to sores - Silver  sulfadiazine  cream used for sores but ineffective due to continued picking - N-acetylcysteine (NAC) supplement trialed as recommended by therapist  Appetite changes and weight gain - Change in appetite with increased preference for sweets  and decreased interest in other foods - Weight gain attributed to altered eating habits     Flowsheet Row Office Visit from 12/12/2023 in [facility]  1 42.5 inches   Body mass index is 34.98 kg/m.  Filed Weights   12/12/23 1335  Weight: 210 lb 3.2 oz (95.3 kg)       12/12/2023    1:38 PM 10/15/2023   10:04 AM 06/13/2023   12:57 PM  Depression screen PHQ 2/9  Decreased Interest 3 3 3   Down, Depressed, Hopeless 3 3 3   PHQ - 2 Score 6 6 6   Altered sleeping 3 3 3   Tired, decreased energy 3 3 3   Change in appetite 1 3 3   Feeling bad or failure about yourself  3 3 3   Trouble concentrating 3 3 3   Moving slowly or fidgety/restless 3 0 3  Suicidal thoughts 1 3 3   PHQ-9 Score 23 24 27   Difficult doing work/chores Extremely dIfficult Very difficult Extremely dIfficult       12/12/2023    1:38 PM 10/15/2023   10:05 AM 06/13/2023   12:57 PM 08/11/2022   11:38 AM  GAD 7 : Generalized Anxiety Score  Nervous, Anxious, on Edge 3 3 3 3   Control/stop worrying 3 3 3 3   Worry too much - different things 3 3 3 3   Trouble relaxing 3 3 3 3   Restless 3 3 3 3   Easily annoyed or irritable 3  3 3 3   Afraid - awful might happen 3 3 3 3   Total GAD 7 Score 21 21 21 21   Anxiety Difficulty Extremely difficult Extremely difficult Extremely difficult Somewhat difficult     Relevant past medical, surgical, family and social history reviewed and updated as indicated. Interim medical history since our last visit reviewed. Allergies and medications reviewed and updated.  Review of Systems  Constitutional: Negative for fever or weight change.  Respiratory: Negative for cough and shortness of breath.   Cardiovascular: Negative for chest pain or palpitations.  Gastrointestinal: Negative for abdominal pain, no bowel changes.  Musculoskeletal: Negative for gait problem or joint swelling.  Skin: Negative for rash.  Neurological: Negative for dizziness or headache.  No other  specific complaints in a complete review of systems (except as listed in HPI above).      Objective:     BP 120/82   Pulse 84   Temp 98 F (36.7 C)   Resp 18   Ht 5' 5 (1.651 m)   Wt 210 lb 3.2 oz (95.3 kg)   SpO2 92%   BMI 34.98 kg/m    Wt Readings from Last 3 Encounters:  12/12/23 210 lb 3.2 oz (95.3 kg)  10/26/23 209 lb 8 oz (95 kg)  10/15/23 208 lb (94.3 kg)    Physical Exam Physical Exam VITALS: BP- 120/82 MEASUREMENTS: Weight- 210, BMI- 34.98. GENERAL: Alert, cooperative, well developed, no acute distress. HEENT: Normocephalic, normal oropharynx, moist mucous membranes. CHEST: Clear to auscultation bilaterally, no wheezes, rhonchi, or crackles. CARDIOVASCULAR: Normal heart rate and rhythm, S1 and S2 normal without murmurs. ABDOMEN: Soft, non-tender, non-distended, without organomegaly, normal bowel sounds. EXTREMITIES: No cyanosis or edema. NEUROLOGICAL: Cranial nerves grossly intact, moves all extremities without gross motor or sensory deficit.   Results for orders placed or performed in visit on 10/15/23  CBC with Differential/Platelet   Collection Time: 10/15/23 11:00 AM  Result Value Ref Range   WBC 6.0 3.8 - 10.8 Thousand/uL   RBC 4.55 3.80 - 5.10 Million/uL   Hemoglobin 14.1 11.7 - 15.5 g/dL   HCT 57.4 64.9 - 54.9 %   MCV 93.4 80.0 - 100.0 fL   MCH 31.0 27.0 - 33.0 pg   MCHC 33.2 32.0 - 36.0 g/dL   RDW 86.2 88.9 - 84.9 %   Platelets 239 140 - 400 Thousand/uL   MPV 10.5 7.5 - 12.5 fL   Neutro Abs 3,468 1,500 - 7,800 cells/uL   Absolute Lymphocytes 1,956 850 - 3,900 cells/uL   Absolute Monocytes 360 200 - 950 cells/uL   Eosinophils Absolute 168 15 - 500 cells/uL   Basophils Absolute 48 0 - 200 cells/uL   Neutrophils Relative % 57.8 %   Total Lymphocyte 32.6 %   Monocytes Relative 6.0 %   Eosinophils Relative 2.8 %   Basophils Relative 0.8 %  Comprehensive metabolic panel with GFR   Collection Time: 10/15/23 11:00 AM  Result Value Ref Range    Glucose, Bld 104 (H) 65 - 99 mg/dL   BUN 21 7 - 25 mg/dL   Creat 9.04 9.39 - 8.99 mg/dL   eGFR 64 > OR = 60 fO/fpw/8.26f7   BUN/Creatinine Ratio SEE NOTE: 6 - 22 (calc)   Sodium 140 135 - 146 mmol/L   Potassium 4.7 3.5 - 5.3 mmol/L   Chloride 103 98 - 110 mmol/L   CO2 27 20 - 32 mmol/L   Calcium  9.5 8.6 - 10.4 mg/dL   Total Protein 6.9 6.1 -  8.1 g/dL   Albumin 4.3 3.6 - 5.1 g/dL   Globulin 2.6 1.9 - 3.7 g/dL (calc)   AG Ratio 1.7 1.0 - 2.5 (calc)   Total Bilirubin 0.5 0.2 - 1.2 mg/dL   Alkaline phosphatase (APISO) 66 37 - 153 U/L   AST 15 10 - 35 U/L   ALT 13 6 - 29 U/L  Celiac Disease Panel   Collection Time: 10/15/23 11:00 AM  Result Value Ref Range   Immunoglobulin A 344 (H) 70 - 320 mg/dL   Gliadin IgA <8.9 U/mL   Gliadin IgG <1.0 U/mL   (tTG) Ab, IgG <1.0 U/mL   (tTG) Ab, IgA <1.0 U/mL  Sed Rate (ESR)   Collection Time: 10/15/23 11:00 AM  Result Value Ref Range   Sed Rate 28 0 - 30 mm/h  C-reactive protein   Collection Time: 10/15/23 11:00 AM  Result Value Ref Range   CRP 5.6 <8.0 mg/L  TSH   Collection Time: 10/15/23 11:00 AM  Result Value Ref Range   TSH 1.31 0.40 - 4.50 mIU/L  VITAMIN D  25 Hydroxy (Vit-D Deficiency, Fractures)   Collection Time: 10/15/23 11:00 AM  Result Value Ref Range   Vit D, 25-Hydroxy 89 30 - 100 ng/mL  Vitamin B12   Collection Time: 10/15/23 11:00 AM  Result Value Ref Range   Vitamin B-12 549 200 - 1,100 pg/mL  RPR   Collection Time: 10/15/23 11:00 AM  Result Value Ref Range   RPR Ser Ql NON-REACTIVE NON-REACTIVE          Assessment & Plan:   Problem List Items Addressed This Visit       Cardiovascular and Mediastinum   Hypertension - Primary (Chronic)     Respiratory   OSA (obstructive sleep apnea)     Digestive   GERD (gastroesophageal reflux disease)     Musculoskeletal and Integument   Bilateral primary osteoarthritis of knee     Other   OCD (obsessive compulsive disorder) (Chronic)   Morbid obesity (HCC)  (Chronic)   Familial hyperlipidemia   Severe episode of recurrent major depressive disorder, without psychotic features (HCC)   Relevant Medications   DULoxetine (CYMBALTA) 30 MG capsule   GAD (generalized anxiety disorder)   Relevant Medications   DULoxetine (CYMBALTA) 30 MG capsule   Attention deficit hyperactivity disorder (ADHD), predominantly hyperactive type   Other Visit Diagnoses       Hallucinations         Brain fog         Forgetfulness         Lipedema       Relevant Orders   Ambulatory referral to Vascular Surgery     Hot flashes       Relevant Orders   TSH   FSH/LH   Estrogens, total   17-Hydroxyprogesterone   Cortisol     Dermatillomania       Relevant Medications   mupirocin  ointment (BACTROBAN ) 2 %        Assessment and Plan Assessment & Plan Obesity and Lipedema Obesity with a BMI of 34.98 and lipedema characterized by lumpy fat distribution, particularly around the knees. She expresses dissatisfaction with body image and is interested in treatment options for lipedema, including probiotic supplements and pumps for lymphedema. - Provide referral to vascular specialist for lipedema treatment options - Discuss potential benefits of probiotic supplements and other treatment options  Osteoarthritis of knees Chronic osteoarthritis with bone-on-bone contact in the knees, causing significant discomfort and impacting  physical activity. This condition is affecting her mental health.  Hypertension Blood pressure is well-controlled at 120/82 mmHg on current medication regimen.  Hyperlipidemia No lipid-lowering medications listed in current regimen.  Gastroesophageal reflux disease (GERD) GERD managed with pantoprazole  40 mg daily.  Obstructive sleep apnea Previous referral for sleep study was closed due to inability to contact her. Current sleep is not worse but is disrupted by her cat. She is not interested in further sleep study or neurology  referrals.  Depression and Anxiety disorder Elevated depression and anxiety screening. Reports infrequent and mild night terrors and hallucinations. Working with therapist to improve mental health, which she believes will aid her physical health. - Discuss with psychiatry during upcoming appointment  Obsessive-compulsive disorder (OCD) and Skin picking disorder (excoriation disorder) Chronic skin picking disorder with significant impact on skin integrity, particularly on legs. Increased severity over the last six months. Silver  sulfadiazine  cream is ineffective due to frequent picking. Considering NAC supplementation as recommended by her therapist. - Consider mupirocin  ointment for skin infections - Discuss NAC supplementation  Attention-deficit hyperactivity disorder (ADHD) No specific discussion regarding ADHD management, managed by psychiatry   General Health Maintenance Inquires about hormone levels due to hot spells and mood changes. Last menstrual period at age 68, indicating postmenopausal status. Interested in understanding if hormone levels could be affecting her symptoms. - Order hormone level testing         Follow up plan: Return in about 6 months (around 06/13/2024) for follow up.

## 2023-12-12 NOTE — Patient Instructions (Addendum)
  Pulmonology ( sleep study) Phone: [phone]

## 2023-12-14 ENCOUNTER — Ambulatory Visit: Payer: Self-pay | Admitting: Nurse Practitioner

## 2023-12-14 DIAGNOSIS — F411 Generalized anxiety disorder: Secondary | ICD-10-CM | POA: Diagnosis not present

## 2023-12-14 DIAGNOSIS — F332 Major depressive disorder, recurrent severe without psychotic features: Secondary | ICD-10-CM | POA: Diagnosis not present

## 2023-12-14 DIAGNOSIS — F603 Borderline personality disorder: Secondary | ICD-10-CM | POA: Diagnosis not present

## 2023-12-22 LAB — FSH/LH
FSH: 57.3 m[IU]/mL
LH: 26.2 m[IU]/mL

## 2023-12-22 LAB — TSH: TSH: 1.33 m[IU]/L (ref 0.40–4.50)

## 2023-12-22 LAB — 17-HYDROXYPROGESTERONE: 17-OH-Progesterone, LC/MS/MS: 11 ng/dL

## 2023-12-22 LAB — ESTROGENS, TOTAL: Estrogen: 99 pg/mL

## 2023-12-22 LAB — CORTISOL: Cortisol, Plasma: 9.1 ug/dL

## 2023-12-27 ENCOUNTER — Other Ambulatory Visit: Payer: Self-pay | Admitting: Internal Medicine

## 2023-12-27 DIAGNOSIS — I10 Essential (primary) hypertension: Secondary | ICD-10-CM

## 2023-12-27 NOTE — Telephone Encounter (Signed)
 Requested Prescriptions  Pending Prescriptions Disp Refills   bisoprolol -hydrochlorothiazide  (ZIAC ) 10-6.25 MG tablet [Pharmacy Med Name: BISOPROLOL /HCTZ 10MG /6.25MG  TABS] 90 tablet 1    Sig: TAKE 1 TABLET BY MOUTH EVERY DAY     Cardiovascular: Beta Blocker + Diuretic Combos Passed - 12/27/2023  5:47 PM      Passed - K in normal range and within 180 days    Potassium  Date Value Ref Range Status  10/15/2023 4.7 3.5 - 5.3 mmol/L Final         Passed - Na in normal range and within 180 days    Sodium  Date Value Ref Range Status  10/15/2023 140 135 - 146 mmol/L Final         Passed - Cr in normal range and within 180 days    Creat  Date Value Ref Range Status  10/15/2023 0.95 0.60 - 1.00 mg/dL Final         Passed - eGFR in normal range and within 180 days    GFR, Est African American  Date Value Ref Range Status  11/12/2019 74 > OR = 60 mL/min/1.77m2 Final   GFR, Est Non African American  Date Value Ref Range Status  11/12/2019 64 > OR = 60 mL/min/1.67m2 Final   eGFR  Date Value Ref Range Status  10/15/2023 64 > OR = 60 mL/min/1.66m2 Final         Passed - Last BP in normal range    BP Readings from Last 1 Encounters:  12/12/23 120/82         Passed - Last Heart Rate in normal range    Pulse Readings from Last 1 Encounters:  12/12/23 84         Passed - Valid encounter within last 6 months    Recent Outpatient Visits           2 weeks ago Primary hypertension   [place] [facility] Gareth Mliss FALCON, FNP   2 months ago Severe episode of recurrent major depressive disorder, without psychotic features [facility])   [facility] Gareth Mliss FALCON, FNP   2 months ago Diarrhea, unspecified type   [facility] Gareth Mliss FALCON, FNP   6 months ago Stage 3a chronic kidney disease [facility])   [facility] Glenard Mire, MD   6 months ago Dysuria   [facility] Leavy Mole, PA-C       Future Appointments             In 5 months Gareth, Mliss FALCON, FNP [facility] [place]

## 2024-01-08 DIAGNOSIS — F332 Major depressive disorder, recurrent severe without psychotic features: Secondary | ICD-10-CM | POA: Diagnosis not present

## 2024-01-08 DIAGNOSIS — F411 Generalized anxiety disorder: Secondary | ICD-10-CM | POA: Diagnosis not present

## 2024-01-08 DIAGNOSIS — F603 Borderline personality disorder: Secondary | ICD-10-CM | POA: Diagnosis not present

## 2024-01-17 ENCOUNTER — Encounter (INDEPENDENT_AMBULATORY_CARE_PROVIDER_SITE_OTHER): Payer: Self-pay | Admitting: Vascular Surgery

## 2024-01-22 ENCOUNTER — Ambulatory Visit: Payer: Self-pay

## 2024-01-22 NOTE — Telephone Encounter (Signed)
 FYI Only or Action Required?: FYI only for provider.  Patient was last seen in primary care on 12/12/2023 by Gareth Mliss FALCON, FNP.  Called Nurse Triage reporting Tachycardia.  Symptoms began today.  Interventions attempted: Prescription medications: Klonopin.  Symptoms are: unchanged.  Triage Disposition: See HCP Within 4 Hours (Or PCP Triage) Had a long conversation with pt.  Pt stated that she had a nightmare last night and had 2 short episodes of fast heartbeat.  Denied chest pain. Pt took Klonopin and no further episodes. Pt became tearful at times discussing lack of relationship with daughter. Pt feeling very vulnerable. Advised pt to touch base with psychiatrist regarding recent increase of dose for depression. Pt. Stated I just don't want to be here. Denied that she is suicidal. Advised to call 911 if suicidal thoughts or call office.  Lab results given per note from PCP.  Pt refused appt stating that she called to make an AWV. Refused appt with PCP.  Called [facility] to assist with making AWV- pt accepted appt for 06/12/24 at 2 pm. Appt wait listed.   Patient/caregiver understands and will follow disposition?:  No refused appt       Copied from CRM #[id number]. Topic: Clinical - Red Word Triage >> Jan 22, 2024 11:27 AM Avram MATSU wrote: Red Word that prompted transfer to Nurse Triage: having hot spells for months/heart beating fast last night, shaky hands.   ----------------------------------------------------------------------- From previous Reason for Contact - Scheduling: Patient/patient representative is calling to schedule an appointment. Refer to attachments for appointment information. Reason for Disposition  Age > 60 years  (Exception: Brief heartbeat symptoms that went away and now feels well.)  Answer Assessment - Initial Assessment Questions 1. DESCRIPTION: Please describe your heart rate or heartbeat that you are having (e.g., fast/slow, regular/irregular, skipped  or extra beats, palpitations)     fast 2. ONSET: When did it start? (e.g., minutes, hours, days)      Last night  3. DURATION: How long does it last (e.g., seconds, minutes, hours)     Stated she took a Klonopin and now is gone  4. PATTERN Does it come and go, or has it been constant since it started?  Does it get worse with exertion?   Are you feeling it now?     Comes and goes  7. RECURRENT SYMPTOM: Have you ever had this before? If Yes, ask: When was the last time? and What happened that time?   8. CAUSE: What do you think is causing the palpitations?     Increased antidepressant 9. CARDIAC HISTORY: Do you have any history of heart disease? (e.g., heart attack, angina, bypass surgery, angioplasty, arrhythmia)      *No Answer* 10. OTHER SYMPTOMS: Do you have any other symptoms? (e.g., dizziness, chest pain, sweating, difficulty breathing)       H/o Shaky hands but feels like it has worsened  , hot flashes, feeling more depressed and anxious  11. PREGNANCY: Is there any chance you are pregnant? When was your last menstrual period?       N/a  Protocols used: Heart Rate and Heartbeat Questions-A-AH

## 2024-01-23 ENCOUNTER — Ambulatory Visit: Payer: Self-pay

## 2024-01-23 NOTE — Telephone Encounter (Signed)
 FYI Only or Action Required?: FYI only for provider.  Patient was last seen in primary care on 12/12/2023 by Gareth Mliss FALCON, FNP.  Called Nurse Triage reporting Fatigue.  Symptoms began several days ago.  Interventions attempted: Rest, hydration, or home remedies.  Symptoms are: gradually worsening.  Triage Disposition: See Physician Within 24 Hours  Patient/caregiver understands and will follow disposition?: Yes      Copied from CRM #[id number]. Topic: Clinical - Red Word Triage >> Jan 23, 2024  9:33 AM Dedra B wrote: [facility] that prompted transfer to Nurse Triage: Pt is still having weakness, chills, and heart beating fast. Warm transfer to NT. Reason for Disposition  Taking a medicine that could cause weakness (e.g., blood pressure medications, diuretics)  Answer Assessment - Initial Assessment Questions Took 4 baby aspirins for symptoms. Patient states she has episodes that last for about 5 minutes and symptoms go away. Patient states she currently dealing with depression and unsure if symptoms are related to her anxiety. Patient denies feeling like she will harm herself or anyone else. Patient states she does not have the nerve to harm herself.    1. DESCRIPTION: Describe how you are feeling.     Feeling a little weakness when moving around.  2. SEVERITY: How bad is it?  Can you stand and walk?     Mild  3. ONSET: When did these symptoms begin? (e.g., hours, days, weeks, months)     Yesterday  4. CAUSE: What do you think is causing the weakness or fatigue? (e.g., not drinking enough fluids, medical problem, trouble sleeping)     Possibly the change of her medications  5. NEW MEDICINES:  Have you started on any new medicines recently? (e.g., opioid pain medicines, benzodiazepines, muscle relaxants, antidepressants, antihistamines, neuroleptics, beta blockers)     New increase in dosage of antidepressant medication  6. OTHER SYMPTOMS: Do you have any other  symptoms? (e.g., chest pain, fever, cough, SOB, vomiting, diarrhea, bleeding, other areas of pain)     Heart palpations, sweating, felt agitated last night  Protocols used: Weakness (Generalized) and Fatigue-A-AH

## 2024-01-24 ENCOUNTER — Ambulatory Visit: Payer: Self-pay

## 2024-01-24 ENCOUNTER — Ambulatory Visit: Admitting: Nurse Practitioner

## 2024-01-24 NOTE — Telephone Encounter (Signed)
 Pt has appt with [facility] for 01/25/24

## 2024-01-24 NOTE — Telephone Encounter (Signed)
 FYI Only or Action Required?: FYI only for provider.  Patient was last seen in primary care on 12/12/2023 by Gareth Mliss FALCON, FNP.  Called Nurse Triage reporting Fatigue.  Symptoms began a week ago.  Interventions attempted: Nothing.  Symptoms are: unchanged.  Triage Disposition: See Physician Within 24 Hours  Patient/caregiver understands and will follow disposition?: Yes        Copied from CRM #[id number]. Topic: Clinical - Red Word Triage >> Jan 24, 2024  1:10 PM Katherine Gould wrote: Red Word that prompted transfer to Nurse Triage: Patient canceled appointment scheduled by NT. Reporting that she is still having fatigue and weakness, and hand and fingers are trembling. Reason for Disposition  [1] MODERATE weakness (e.g., interferes with work, school, normal activities) AND [2] persists > 3 days  Answer Assessment - Initial Assessment Questions When she exerts herself she feels weak 2 little episodes of heart racing the other day when she called Felt better yesterday because she inactive and cancelled. Thought bc she ate well  Walked outside, breathing harder, takes a minute to get breathing to settle down and weakness to go away.   Pt has had multiple triage calls this week. She was scheduled for an appt today and cancelled it because she was feeling better yesterday. She thinks she felt better yesterday because she wasn't active and had a good meal. But today she went out to the mail box and had to rest when she came back in. Had some chest tightness, shortness of breath. No symptoms currently.   1. DESCRIPTION: Describe how you are feeling.    Weakness after exertion 2. SEVERITY: How bad is it?  Can you stand and walk?     Still able to walk  4. CAUSE: What do you think is causing the weakness or fatigue? (e.g., not drinking enough fluids, medical problem, trouble sleeping)     unknown 5. NEW MEDICINES:  Have you started on any new medicines recently? (e.g., opioid  pain medicines, benzodiazepines, muscle relaxants, antidepressants, antihistamines, neuroleptics, beta blockers)     Changed effexor to cymbalta about 6 weeks ago, back to the effexor increased it to 112.5mg , klonopin bid, prebiotic 6. OTHER SYMPTOMS: Do you have any other symptoms? (e.g., chest pain, fever, cough, SOB, vomiting, diarrhea, bleeding, other areas of pain)     Chest tightness with these events, shortness of breath  Protocols used: Weakness (Generalized) and Fatigue-A-AH

## 2024-01-25 ENCOUNTER — Ambulatory Visit

## 2024-01-25 ENCOUNTER — Ambulatory Visit (INDEPENDENT_AMBULATORY_CARE_PROVIDER_SITE_OTHER)

## 2024-01-25 VITALS — BP 136/73 | HR 71 | Temp 98.1°F | Ht 65.0 in | Wt 207.8 lb

## 2024-01-25 DIAGNOSIS — R002 Palpitations: Secondary | ICD-10-CM | POA: Diagnosis not present

## 2024-01-25 DIAGNOSIS — R5383 Other fatigue: Secondary | ICD-10-CM

## 2024-01-25 NOTE — Assessment & Plan Note (Signed)
 Patient with intermittent palpitations, sometimes occurring when walking and sometimes occurring at rest. EKG normal in office. Family history of cardiac issues noted. DDx includes electrolyte disturbance, anemia, thyroid  disorder, anxiety, arrhythmia, recent increase of venlafaxine dose. - Order Zio patch for home monitoring of heart rhythm. - Perform blood work as below - Recommend patient follow up with psychiatry to discuss recent medication change - Advise to seek urgent care or emergency room if symptoms worsen.

## 2024-01-25 NOTE — Assessment & Plan Note (Signed)
 Patient with acute on chronic fatigue that has worsened over the last few days. Patient suspects it may be due to recent increase in venlafaxine dosage. Will perform blood work as below to rule out other causes. Recommend patient follow up with psychiatry given worsening depression.

## 2024-01-25 NOTE — Progress Notes (Signed)
 Acute visit   Patient: Katherine Gould   DOB: 1950/11/04   73 y.o. Female  MRN: [id number] PCP: Gareth Mliss FALCON, FNP   Chief Complaint  Patient presents with   Fatigue    Patient presents for weakness/ fatigue onset x 3-4 days. States she feels shaky, was feeling agitated, having hot flashes and sweating a lot. States she felt a strange feeling in her chest Tuesday night    Subjective    Discussed the use of AI scribe software for clinical note transcription with the patient, who gave verbal consent to proceed.  History of Present Illness Katherine Gould is a 73 year old female who presents with palpitations and weakness.  She experiences palpitations, which she describes as feeling slightly better this morning. The symptoms began after walking to the grocery store, where she had two episodes of palpitations, described as 'boom, boom, boom', occurring occasionally at night. She has a history of taking aspirin during these episodes but did not find it necessary this time. No recent palpitations since Monday night.  She reports weakness and fatigue. She has a history of depression and has been on various antidepressants. Recently, she switched from Cymbalta back to Effexor, increasing her dose to 112.5 mg daily, which she started about a week to ten days ago. She suspects this change might be contributing to her current symptoms. She also takes clonazepam twice daily and has not changed this dose recently.  She restarted vitamin D  supplementation three days ago after a period of not taking it. Her last blood work showed high vitamin D  levels when she was taking 5000 IU daily, so she switched to a lower dose of 2000 IU daily.  Her family history is significant for heart disease, with her sister having died from congestive heart failure, her mother having severe aortic stenosis and strokes, and her father and grandfather having had heart attacks.   She describes her social situation as somewhat  reclusive, with limited physical activity due to bone-on-bone knees, which have delayed her knee surgery. She relies on chocolate and sweets as a coping mechanism for depression. She has a supportive relationship with her brother-in-law, with whom she shares meals and cares for his cats.  Review of systems as noted in HPI.   Objective    BP 136/73 (BP Location: Left Arm, Patient Position: Sitting, Cuff Size: Normal)   Pulse 71   Temp 98.1 F (36.7 C) (Oral)   Ht 5' 5 (1.651 m)   Wt 207 lb 12.8 oz (94.3 kg)   SpO2 96%   BMI 34.58 kg/m  Physical Exam Constitutional:      Appearance: Normal appearance.  HENT:     Head: Normocephalic and atraumatic.     Mouth/Throat:     Mouth: Mucous membranes are moist.  Eyes:     Pupils: Pupils are equal, round, and reactive to light.  Cardiovascular:     Rate and Rhythm: Normal rate and regular rhythm.     Heart sounds: Normal heart sounds.  Pulmonary:     Effort: Pulmonary effort is normal.     Breath sounds: Normal breath sounds.  Skin:    General: Skin is warm.  Neurological:     General: No focal deficit present.     Mental Status: She is alert.       No results found for any visits on 01/25/24.  Assessment & Plan     Problem List Items Addressed This Visit  Other   Palpitations   Patient with intermittent palpitations, sometimes occurring when walking and sometimes occurring at rest. EKG normal in office. Family history of cardiac issues noted. DDx includes electrolyte disturbance, anemia, thyroid  disorder, anxiety, arrhythmia, recent increase of venlafaxine dose. - Order Zio patch for home monitoring of heart rhythm. - Perform blood work as below - Recommend patient follow up with psychiatry to discuss recent medication change - Advise to seek urgent care or emergency room if symptoms worsen.      Relevant Orders   Comprehensive metabolic panel with GFR   CBC   TSH   LONG TERM MONITOR (3-14 DAYS)   Magnesium    EKG 12-Lead   Other fatigue - Primary   Patient with acute on chronic fatigue that has worsened over the last few days. Patient suspects it may be due to recent increase in venlafaxine dosage. Will perform blood work as below to rule out other causes. Recommend patient follow up with psychiatry given worsening depression.      Relevant Orders   Comprehensive metabolic panel with GFR   CBC   Iron, TIBC and Ferritin Panel   TSH   VITAMIN D  25 Hydroxy (Vit-D Deficiency, Fractures)   Magnesium     No orders of the defined types were placed in this encounter.    No follow-ups on file.      Isaiah DELENA Pepper, MD  [facility] [phone] (phone) [phone] (fax)

## 2024-02-01 ENCOUNTER — Other Ambulatory Visit: Payer: Self-pay | Admitting: Nurse Practitioner

## 2024-02-01 DIAGNOSIS — R109 Unspecified abdominal pain: Secondary | ICD-10-CM

## 2024-02-01 DIAGNOSIS — K219 Gastro-esophageal reflux disease without esophagitis: Secondary | ICD-10-CM

## 2024-02-01 DIAGNOSIS — R112 Nausea with vomiting, unspecified: Secondary | ICD-10-CM

## 2024-02-01 NOTE — Telephone Encounter (Unsigned)
 Copied from CRM [id number]. Topic: Clinical - Medication Refill >> Feb 01, 2024  2:15 PM Fonda T wrote: Medication: pantoprazole  (PROTONIX ) 40 MG tablet  Has the patient contacted their pharmacy? Yes, advised to contact office   This is the patient's preferred pharmacy:  [id number] [place], [place] - [address] AT [facility] [address] [place] [address] [place] [address] Phone: [phone] Fax: [phone]   Is this the correct pharmacy for this prescription? Yes If no, delete pharmacy and type the correct one.   Has the prescription been filled recently? Yes  Is the patient out of the medication? Yes  Has the patient been seen for an appointment in the last year OR does the patient have an upcoming appointment? Yes  Can we respond through MyChart? No, prefers a phone call at [phone]  Agent: Please be advised that Rx refills may take up to 3 business days. We ask that you follow-up with your pharmacy.

## 2024-02-04 NOTE — Telephone Encounter (Signed)
 Requested Prescriptions  Refused Prescriptions Disp Refills   pantoprazole  (PROTONIX ) 40 MG tablet 90 tablet 1     Gastroenterology: Proton Pump Inhibitors Passed - 02/04/2024  5:31 PM      Passed - Valid encounter within last 12 months    Recent Outpatient Visits           1 week ago Other fatigue   [facility] Franchot Isaiah LABOR, MD   1 month ago Primary hypertension   [facility] Gareth Clarity F, FNP   3 months ago Severe episode of recurrent major depressive disorder, without psychotic features [facility])   [facility] Gareth Clarity FALCON, FNP   3 months ago Diarrhea, unspecified type   [facility] Gareth Clarity FALCON, FNP   7 months ago Stage 3a chronic kidney disease [facility])   [facility] Sowles, Krichna, MD       Future Appointments             In 4 months Gareth, Clarity FALCON, FNP [facility] [place]

## 2024-02-04 NOTE — Telephone Encounter (Signed)
 Called pharmacy - pt has refill available. Pharmacy will prepare refill.

## 2024-02-05 ENCOUNTER — Encounter (INDEPENDENT_AMBULATORY_CARE_PROVIDER_SITE_OTHER): Admitting: Vascular Surgery

## 2024-02-08 ENCOUNTER — Ambulatory Visit (INDEPENDENT_AMBULATORY_CARE_PROVIDER_SITE_OTHER)

## 2024-02-08 DIAGNOSIS — R5383 Other fatigue: Secondary | ICD-10-CM | POA: Diagnosis not present

## 2024-02-08 DIAGNOSIS — Z87898 Personal history of other specified conditions: Secondary | ICD-10-CM

## 2024-02-08 DIAGNOSIS — R002 Palpitations: Secondary | ICD-10-CM | POA: Diagnosis not present

## 2024-02-08 NOTE — Progress Notes (Signed)
 I did not evaluate the patient today. Nurse visit only to assist with Ziopatch placement.  Isaiah Pepper, MD [facility]

## 2024-02-09 LAB — CBC
Hematocrit: 44.7 % (ref 34.0–46.6)
Hemoglobin: 14.7 g/dL (ref 11.1–15.9)
MCH: 30.8 pg (ref 26.6–33.0)
MCHC: 32.9 g/dL (ref 31.5–35.7)
MCV: 94 fL (ref 79–97)
Platelets: 282 x10E3/uL (ref 150–450)
RBC: 4.77 x10E6/uL (ref 3.77–5.28)
RDW: 13.2 % (ref 11.7–15.4)
WBC: 7.9 x10E3/uL (ref 3.4–10.8)

## 2024-02-09 LAB — COMPREHENSIVE METABOLIC PANEL WITH GFR
ALT: 14 IU/L (ref 0–32)
AST: 17 IU/L (ref 0–40)
Albumin: 4.4 g/dL (ref 3.8–4.8)
Alkaline Phosphatase: 88 IU/L (ref 49–135)
BUN/Creatinine Ratio: 20 (ref 12–28)
BUN: 21 mg/dL (ref 8–27)
Bilirubin Total: 0.4 mg/dL (ref 0.0–1.2)
CO2: 24 mmol/L (ref 20–29)
Calcium: 9.8 mg/dL (ref 8.7–10.3)
Chloride: 98 mmol/L (ref 96–106)
Creatinine, Ser: 1.05 mg/dL — ABNORMAL HIGH (ref 0.57–1.00)
Globulin, Total: 2.6 g/dL (ref 1.5–4.5)
Glucose: 97 mg/dL (ref 70–99)
Potassium: 4.2 mmol/L (ref 3.5–5.2)
Sodium: 139 mmol/L (ref 134–144)
Total Protein: 7 g/dL (ref 6.0–8.5)
eGFR: 56 mL/min/1.73 — ABNORMAL LOW (ref >59)

## 2024-02-09 LAB — VITAMIN D 25 HYDROXY (VIT D DEFICIENCY, FRACTURES): Vit D, 25-Hydroxy: 61 ng/mL (ref 30.0–100.0)

## 2024-02-09 LAB — MAGNESIUM: Magnesium: 2.1 mg/dL (ref 1.6–2.3)

## 2024-02-09 LAB — IRON,TIBC AND FERRITIN PANEL
Ferritin: 136 ng/mL (ref 15–150)
Iron Saturation: 22 % (ref 15–55)
Iron: 75 ug/dL (ref 27–139)
Total Iron Binding Capacity: 348 ug/dL (ref 250–450)
UIBC: 273 ug/dL (ref 118–369)

## 2024-02-09 LAB — TSH: TSH: 2.75 u[IU]/mL (ref 0.450–4.500)

## 2024-02-11 ENCOUNTER — Ambulatory Visit: Payer: Self-pay

## 2024-02-11 DIAGNOSIS — F411 Generalized anxiety disorder: Secondary | ICD-10-CM | POA: Diagnosis not present

## 2024-02-11 DIAGNOSIS — F603 Borderline personality disorder: Secondary | ICD-10-CM | POA: Diagnosis not present

## 2024-02-11 DIAGNOSIS — F332 Major depressive disorder, recurrent severe without psychotic features: Secondary | ICD-10-CM | POA: Diagnosis not present

## 2024-02-21 ENCOUNTER — Other Ambulatory Visit: Payer: Self-pay | Admitting: Cardiology

## 2024-02-21 DIAGNOSIS — R002 Palpitations: Secondary | ICD-10-CM

## 2024-02-21 DIAGNOSIS — R9431 Abnormal electrocardiogram [ECG] [EKG]: Secondary | ICD-10-CM

## 2024-02-22 ENCOUNTER — Encounter (INDEPENDENT_AMBULATORY_CARE_PROVIDER_SITE_OTHER): Admitting: Vascular Surgery

## 2024-02-26 NOTE — Telephone Encounter (Unsigned)
 Copied from CRM [id number]. Topic: Referral - Question >> Feb 26, 2024 10:45 AM Selinda RAMAN wrote: Reason for CRM: The patient called in returning a call to someone but she didn't know who. I called and spoke with Allena and she looked behind me and saw no messages of any attempts to contact the patient. I told the patient this and she said it was probably about a referral to Cardiology. After checking I see where she was referred to Cardiology in [place] and told her that. She said she does not drive to [place] and needs something a lot closer. The furthest she said she would drive is [facility] but she would really prefer something in [place]. She last saw Dr Franchot a few weeks back and would like a call back to discuss this further.

## 2024-02-27 ENCOUNTER — Other Ambulatory Visit: Payer: Self-pay

## 2024-02-27 DIAGNOSIS — R002 Palpitations: Secondary | ICD-10-CM

## 2024-02-28 ENCOUNTER — Ambulatory Visit

## 2024-02-29 ENCOUNTER — Telehealth: Payer: Self-pay | Admitting: Nurse Practitioner

## 2024-02-29 ENCOUNTER — Ambulatory Visit

## 2024-02-29 VITALS — BP 156/69 | HR 60

## 2024-02-29 DIAGNOSIS — I471 Supraventricular tachycardia, unspecified: Secondary | ICD-10-CM | POA: Diagnosis not present

## 2024-02-29 DIAGNOSIS — R3989 Other symptoms and signs involving the genitourinary system: Secondary | ICD-10-CM

## 2024-02-29 MED ORDER — CEPHALEXIN 500 MG PO CAPS: 500.0000 mg | ORAL_CAPSULE | Freq: Two times a day (BID) | ORAL | 0 refills | Status: AC

## 2024-02-29 NOTE — Progress Notes (Signed)
 Acute visit   Patient: Katherine Gould   DOB: 1950/08/08   73 y.o. Female  MRN: [id number] PCP: Gareth Mliss FALCON, FNP   Chief Complaint  Patient presents with   Acute Visit   Urinary Tract Infection    Pt reports interstitial cystitis and assumed that was the cause of her symptoms. She reports new onset cloudy malodorous urine and urinary frequency. The current episode started a few weeks ago and is gradually worsening. Patient states symptoms are moderate in intensity, occurring  at random . She  has not been recently treated for similar symptoms.  Associated symptoms: pelvic pain, sharp pain above pubic bone   Subjective    Discussed the use of AI scribe software for clinical note transcription with the patient, who gave verbal consent to proceed.  History of Present Illness Katherine Gould is a 73 year old female with interstitial cystitis who presents with urinary symptoms.  She has experienced worsening urinary symptoms over time, including frequent urination, cloudy urine, and bladder retention. Recently, she developed new sharp pelvic pain. No blood in the urine. She feels cold but attributes this to her home environment and age, denying any fever.  She has a history of abnormal heart rhythms and recently wore a heart monitor, which revealed episodes of ventricular tachycardia. She experiences palpitations, particularly during bad dreams, and has a family history of heart issues. A heart evaluation about ten years ago revealed a leaky valve deemed normal for her age.   Review of systems as noted in HPI.   Objective    BP (!) 156/69 (BP Location: Left Arm, Patient Position: Sitting, Cuff Size: Large)   Pulse 60   SpO2 98%  Physical Exam Constitutional:      Appearance: Normal appearance.  HENT:     Head: Normocephalic and atraumatic.     Mouth/Throat:     Mouth: Mucous membranes are moist.  Eyes:     Pupils: Pupils are equal, round, and reactive to light.  Pulmonary:      Effort: Pulmonary effort is normal.  Skin:    General: Skin is warm.  Neurological:     General: No focal deficit present.     Mental Status: She is alert.     No results found for any visits on 02/29/24.  Assessment & Plan     Problem List Items Addressed This Visit       Cardiovascular and Mediastinum   SVT (supraventricular tachycardia)   Other Visit Diagnoses       Suspected UTI    -  Primary   Relevant Medications   cephALEXin  (KEFLEX ) 500 MG capsule   Other Relevant Orders   Urinalysis, Routine w reflex microscopic   Urine Culture       Assessment & Plan Urinary tract infection Increased urinary frequency, cloudy urine, and pelvic pain suggest UTI. Interstitial cystitis and bladder retention may contribute. No fever or chills. Patient is unable to give a urine sample in the office today. - Prescribed Keflex  500 mg orally twice daily for 5 days. - Encouraged patient to bring back urine sample for analysis. - Advised follow-up if no improvement with antibiotics.  SVT (supraventricular tachycardia) Chronic, uncontrolled. Patient with hx of palpitations. Reviewed results of recent Ziopatch with patient. Report showed evidence of SVT as well as a few episodes of VT. Patient already referred to cardiology. Encouraged patient to follow up with cardiology - Provided contact information for cardiology appointment.   Meds ordered  this encounter  Medications   cephALEXin  (KEFLEX ) 500 MG capsule    Sig: Take 1 capsule (500 mg total) by mouth 2 (two) times daily for 5 days.    Dispense:  10 capsule    Refill:  0     No follow-ups on file.      Isaiah DELENA Pepper, MD  [facility] [phone] (phone) [phone] (fax)

## 2024-02-29 NOTE — Progress Notes (Signed)
 SABRA

## 2024-02-29 NOTE — Patient Instructions (Signed)
 Call the cardiologist:  [address] [place] [address] [phone]

## 2024-02-29 NOTE — Telephone Encounter (Signed)
pantoprazole (PROTONIX) 40 MG tablet ° °

## 2024-03-03 ENCOUNTER — Other Ambulatory Visit: Payer: Self-pay

## 2024-03-03 DIAGNOSIS — R112 Nausea with vomiting, unspecified: Secondary | ICD-10-CM

## 2024-03-03 DIAGNOSIS — R109 Unspecified abdominal pain: Secondary | ICD-10-CM

## 2024-03-03 DIAGNOSIS — K219 Gastro-esophageal reflux disease without esophagitis: Secondary | ICD-10-CM

## 2024-03-03 MED ORDER — PANTOPRAZOLE SODIUM 40 MG PO TBEC: 40.0000 mg | DELAYED_RELEASE_TABLET | Freq: Every day | ORAL | 0 refills | Status: AC

## 2024-03-03 NOTE — Telephone Encounter (Signed)
 Refill sent.

## 2024-03-09 NOTE — Progress Notes (Deleted)
  Cardiology Office Note   Date:  03/09/2024  ID:  JAYLNN ULLERY, DOB 10-Feb-1951, MRN [id number] PCP: Gareth Mliss FALCON, FNP  [facility] Providers Cardiologist:  None { Click to update primary MD,subspecialty MD or APP then REFRESH:1}    History of Present Illness Katherine Gould is a 73 y.o. female PMH HTN who presents for further evaluation and management of an abnormal monitor.  ***.  Last LDL 130 05/2023.  TSH normal 01/2024.  CBC, CMP unremarkable.  Relevant CVD History -Zio monitor 02/21/2024 mean heart rate 61 bpm, 247 episodes of SVT (longest 20.8 seconds), 2 episodes of NSVT (longest 15.8 seconds), no atrial fibrillation, occasional PVCs at 1.7%, triggers corresponded to sinus rhythm.   ROS: Pt denies any chest discomfort, jaw pain, arm pain, palpitations, syncope, presyncope, orthopnea, PND, or LE edema.  Studies Reviewed I have independently reviewed the patient's ECG, previous cardiac testing, recent medical records, recent blood work.  Physical Exam VS:  There were no vitals taken for this visit.       Wt Readings from Last 3 Encounters:  01/25/24 207 lb 12.8 oz (94.3 kg)  12/12/23 210 lb 3.2 oz (95.3 kg)  10/26/23 209 lb 8 oz (95 kg)    GEN: No acute distress. NECK: No JVD; No carotid bruits. CARDIAC: ***RRR, no murmurs, rubs, gallops. RESPIRATORY:  Clear to auscultation. EXTREMITIES:  Warm and well-perfused. No edema.  ASSESSMENT AND PLAN Paroxysmal tachycardia SVT NSVT PVCs        {Are you ordering a CV Procedure (e.g. stress test, cath, DCCV, TEE, etc)?   Press F2        :[id number]}  Dispo: ***  Signed, Caron Poser, MD

## 2024-03-12 ENCOUNTER — Ambulatory Visit

## 2024-03-18 NOTE — Progress Notes (Deleted)
  Cardiology Office Note   Date:  03/18/2024  ID:  Katherine Gould, DOB 1950/10/06, MRN [id number] PCP: Gareth Mliss FALCON, FNP  [facility] Providers Cardiologist:  None { Click to update primary MD,subspecialty MD or APP then REFRESH:1}    History of Present Illness Katherine Gould is a 73 y.o. female PMH HTN who presents for further evaluation and management of an abnormal monitor.  ***.  Last LDL 130 05/2023.  TSH normal 01/2024.  CBC, CMP unremarkable.  Relevant CVD History -Zio monitor 02/21/2024 mean heart rate 61 bpm, 247 episodes of SVT (longest 20.8 seconds), 2 episodes of NSVT (longest 15.8 seconds), no atrial fibrillation, occasional PVCs at 1.7%, triggers corresponded to sinus rhythm.   ROS: Pt denies any chest discomfort, jaw pain, arm pain, palpitations, syncope, presyncope, orthopnea, PND, or LE edema.  Studies Reviewed I have independently reviewed the patient's ECG, previous cardiac testing, recent medical records, recent blood work.  Physical Exam VS:  There were no vitals taken for this visit.       Wt Readings from Last 3 Encounters:  01/25/24 207 lb 12.8 oz (94.3 kg)  12/12/23 210 lb 3.2 oz (95.3 kg)  10/26/23 209 lb 8 oz (95 kg)    GEN: No acute distress. NECK: No JVD; No carotid bruits. CARDIAC: ***RRR, no murmurs, rubs, gallops. RESPIRATORY:  Clear to auscultation. EXTREMITIES:  Warm and well-perfused. No edema.  ASSESSMENT AND PLAN Paroxysmal tachycardia SVT NSVT PVCs        {Are you ordering a CV Procedure (e.g. stress test, cath, DCCV, TEE, etc)?   Press F2        :[id number]}  Dispo: ***  Signed, Caron Poser, MD

## 2024-03-20 ENCOUNTER — Ambulatory Visit

## 2024-03-21 DIAGNOSIS — F603 Borderline personality disorder: Secondary | ICD-10-CM | POA: Diagnosis not present

## 2024-03-21 DIAGNOSIS — F411 Generalized anxiety disorder: Secondary | ICD-10-CM | POA: Diagnosis not present

## 2024-03-21 DIAGNOSIS — F332 Major depressive disorder, recurrent severe without psychotic features: Secondary | ICD-10-CM | POA: Diagnosis not present

## 2024-03-25 ENCOUNTER — Ambulatory Visit

## 2024-03-25 VITALS — BP 140/78 | HR 75 | Ht 65.0 in | Wt 207.0 lb

## 2024-03-25 DIAGNOSIS — I471 Supraventricular tachycardia, unspecified: Secondary | ICD-10-CM

## 2024-03-25 DIAGNOSIS — I479 Paroxysmal tachycardia, unspecified: Secondary | ICD-10-CM | POA: Diagnosis not present

## 2024-03-25 DIAGNOSIS — I4729 Other ventricular tachycardia: Secondary | ICD-10-CM

## 2024-03-25 DIAGNOSIS — I493 Ventricular premature depolarization: Secondary | ICD-10-CM

## 2024-03-25 DIAGNOSIS — R0609 Other forms of dyspnea: Secondary | ICD-10-CM

## 2024-03-25 DIAGNOSIS — R079 Chest pain, unspecified: Secondary | ICD-10-CM

## 2024-03-25 NOTE — Progress Notes (Signed)
  Cardiology Office Note   Date:  03/25/2024  ID:  Katherine Gould, DOB 1950-09-09, MRN [id number] PCP: Gareth Mliss FALCON, FNP  [facility] Providers Cardiologist:  Caron Poser, MD     History of Present Illness Katherine Gould is a 73 y.o. female PMH HTN who presents for further evaluation and management of an abnormal monitor.  Patient reports she has occasional dyspnea on exertion and chest tightness.  Palpitations do not seem to be a significant issue.  No syncope.  Last LDL 130 05/2023.  TSH normal 01/2024.  CBC, CMP unremarkable.  Relevant CVD History -Zio monitor 02/21/2024 mean heart rate 61 bpm, 247 episodes of SVT (longest 20.8 seconds), 2 episodes of NSVT (longest 15.8 seconds), no atrial fibrillation, occasional PVCs at 1.7%, triggers corresponded to sinus rhythm.   ROS: Pt denies any chest discomfort, jaw pain, arm pain, palpitations, syncope, presyncope, orthopnea, PND, or LE edema.  Studies Reviewed I have independently reviewed the patient's ECG, previous cardiac testing, recent medical records, recent blood work.  Physical Exam VS:  BP (!) 140/78 (BP Location: Left Arm, Patient Position: Sitting, Cuff Size: Large)   Pulse 75   Ht 5' 5 (1.651 m)   Wt 207 lb (93.9 kg)   SpO2 97%   BMI 34.45 kg/m        Wt Readings from Last 3 Encounters:  03/25/24 207 lb (93.9 kg)  01/25/24 207 lb 12.8 oz (94.3 kg)  12/12/23 210 lb 3.2 oz (95.3 kg)    GEN: No acute distress. NECK: No JVD; No carotid bruits. CARDIAC: RRR, no murmurs, rubs, gallops. RESPIRATORY:  Clear to auscultation. EXTREMITIES:  Warm and well-perfused. No edema.  ASSESSMENT AND PLAN DOE Chest discomfort HLD Patient presents for further evaluation of dyspnea on exertion and intermittent chest tightness.  She reports benign testing approximately 10 years ago; no records available for review.  Last LDL 130 05/2023.  Further stratification is warranted.  Plan: - Stress PET-CT to further stratify -  Echocardiogram to evaluate structural causes - Discussed that if her PET scan shows any evidence of ischemia/infarct or if the CT component shows any evidence of coronary calcifications, the recommendation would be for aspirin and a statin medication.  She says that she will not be taking a statin.  Further plans pending results.  Paroxysmal tachycardia SVT NSVT PVCs Significant SVT burden seen on recent monitor.  Longest episode was approximately 20 seconds.  Also had 2 episodes of NSVT.  None of her triggers corresponded to episodes.  She denies a significant symptomatic component to this.  Discussed increasing her beta-blockade versus keeping medications the same since she is minimally symptomatic.  She would prefer to keep medications same for now.  Plan: - Echocardiogram as above to evaluate structural causes - Continue bisoprolol -HCTZ combo medication for now; discussed that we may need to eventually split this medication up if additional cardiac indications arise     Informed Consent   The risks [chest pain, shortness of breath, cardiac arrhythmias, dizziness, blood pressure fluctuations, myocardial infarction, stroke/transient ischemic attack, nausea, vomiting, allergic reaction, radiation exposure, metallic taste sensation and life-threatening complications (estimated to be 1 in 10,000)], benefits (risk stratification, diagnosing coronary artery disease, treatment guidance) and alternatives of a cardiac PET stress test were discussed in detail with Katherine Gould and she agrees to proceed.     Dispo: RTC 3 months or sooner as needed  Signed, Caron Poser, MD

## 2024-03-25 NOTE — Patient Instructions (Addendum)
 Medication Instructions:  Your physician recommends that you continue on your current medications as directed. Please refer to the Current Medication list given to you today.  *If you need a refill on your cardiac medications before your next appointment, please call your pharmacy*  Lab Work: No labs ordered today  If you have labs (blood work) drawn today and your tests are completely normal, you will receive your results only by: MyChart Message (if you have MyChart) OR A paper copy in the mail If you have any lab test that is abnormal or we need to change your treatment, we will call you to review the results.  Testing/Procedures:    Please report to Radiology at the [facility] Main Entrance 30 minutes early for your test.  [address] [place], [place] [address]                         OR   Please report to Radiology at [facility] Main Entrance, medical mall, 30 mins prior to your test.  [address]  [place], [place]  How to Prepare for Your Cardiac PET/CT Stress Test:  Nothing to eat or drink, except water, 3 hours prior to arrival time.  NO caffeine/decaffeinated products, or chocolate 12 hours prior to arrival. (Please note decaffeinated beverages (teas/coffees) still contain caffeine).  If you have caffeine within 12 hours prior, the test will need to be rescheduled.  Medication instructions:  Hold your bisoprolol -hydrochlorothiazide  (ZIAC ) prior to procedure.  You may take your remaining medications with water.  NO perfume, cologne or lotion on chest or abdomen area. FEMALES - Please avoid wearing dresses to this appointment.  Total time is 1 to 2 hours; you may want to bring reading material for the waiting time.   In preparation for your appointment, medication and supplies will be purchased.  Appointment availability is limited, so if you need to cancel or reschedule, please call the Radiology Department Scheduler at  [phone] 24 hours in advance to avoid a cancellation fee of $100.00  What to Expect When you Arrive:  Once you arrive and check in for your appointment, you will be taken to a preparation room within the Radiology Department.  A technologist or Nurse will obtain your medical history, verify that you are correctly prepped for the exam, and explain the procedure.  Afterwards, an IV will be started in your arm and electrodes will be placed on your skin for EKG monitoring during the stress portion of the exam. Then you will be escorted to the PET/CT scanner.  There, staff will get you positioned on the scanner and obtain a blood pressure and EKG.  During the exam, you will continue to be connected to the EKG and blood pressure machines.  A small, safe amount of a radioactive tracer will be injected in your IV to obtain a series of pictures of your heart along with an injection of a stress agent.    After your Exam:  It is recommended that you eat a meal and drink a caffeinated beverage to counter act any effects of the stress agent.  Drink plenty of fluids for the remainder of the day and urinate frequently for the first couple of hours after the exam.  Your doctor will inform you of your test results within 7-10 business days.  For more information and frequently asked questions, please visit our website: [email or web address]  For questions about your test or  how to prepare for your test, please call: Cardiac Imaging Nurse Navigators Office: [phone]     Your physician has requested that you have an echocardiogram. Echocardiography is a painless test that uses sound waves to create images of your heart. It provides your doctor with information about the size and shape of your heart and how well your heart's chambers and valves are working.   You may receive an ultrasound enhancing agent through an IV if needed to better visualize your heart during the echo. This procedure  takes approximately one hour.  There are no restrictions for this procedure.  This will take place at [address] [facility]) [address], [place] [address]  Please note: We ask at that you not bring children with you during ultrasound (echo/ vascular) testing. Due to room size and safety concerns, children are not allowed in the ultrasound rooms during exams. Our front office staff cannot provide observation of children in our lobby area while testing is being conducted. An adult accompanying a patient to their appointment will only be allowed in the ultrasound room at the discretion of the ultrasound technician under special circumstances. We apologize for any inconvenience.   Follow-Up: At [facility], you and your health needs are our priority.  As part of our continuing mission to provide you with exceptional heart care, our providers are all part of one team.  This team includes your primary Cardiologist (physician) and Advanced Practice Providers or APPs (Physician Assistants and Nurse Practitioners) who all work together to provide you with the care you need, when you need it.  Your next appointment:   3 month(s)  Provider:   You may see one of the following Advanced Practice Providers on your designated Care Team:   Lonni Meager, NP Lesley Maffucci, PA-C Bernardino Bring, PA-C Cadence Sumner, PA-C Tylene Lunch, NP Barnie Hila, NP   We recommend signing up for the patient portal called MyChart.  Sign up information is provided on this After Visit Summary.  MyChart is used to connect with patients for Virtual Visits (Telemedicine).  Patients are able to view lab/test results, encounter notes, upcoming appointments, etc.  Non-urgent messages can be sent to your provider as well.   To learn more about what you can do with MyChart, go to [email or web address].

## 2024-03-26 ENCOUNTER — Other Ambulatory Visit: Payer: Self-pay | Admitting: Nurse Practitioner

## 2024-03-26 DIAGNOSIS — R112 Nausea with vomiting, unspecified: Secondary | ICD-10-CM

## 2024-03-26 DIAGNOSIS — R109 Unspecified abdominal pain: Secondary | ICD-10-CM

## 2024-03-26 DIAGNOSIS — K219 Gastro-esophageal reflux disease without esophagitis: Secondary | ICD-10-CM

## 2024-03-27 NOTE — Telephone Encounter (Signed)
 Too soon for refill, LRF 02/16/24.  Requested Prescriptions  Pending Prescriptions Disp Refills   pantoprazole  (PROTONIX ) 40 MG tablet [Pharmacy Med Name: PANTOPRAZOLE  40MG  TABLETS] 90 tablet 0    Sig: TAKE 1 TABLET(40 MG) BY MOUTH DAILY     Gastroenterology: Proton Pump Inhibitors Passed - 03/27/2024  4:07 PM      Passed - Valid encounter within last 12 months    Recent Outpatient Visits           3 weeks ago Suspected UTI   [facility] Franchot Isaiah LABOR, MD   1 month ago History of palpitations   [facility] Franchot Isaiah LABOR, MD   2 months ago Other fatigue   [facility] Franchot Isaiah LABOR, MD   3 months ago Primary hypertension   [facility] Gareth Clarity F, FNP   5 months ago Severe episode of recurrent major depressive disorder, without psychotic features [facility])   [facility] Gareth Clarity FALCON, FNP       Future Appointments             In 2 months Gareth, Clarity FALCON, FNP [facility] [place]   In 2 months Argentina Clap, MD [facility]

## 2024-04-25 ENCOUNTER — Ambulatory Visit

## 2024-05-02 NOTE — Progress Notes (Signed)
 Katherine Gould                                          MRN: [id number]   05/02/2024   The VBCI Quality Team Specialist reviewed this patient medical record for the purposes of chart review for care gap closure. The following were reviewed: chart review for care gap closure-controlling blood pressure.    VBCI Quality Team

## 2024-05-14 ENCOUNTER — Ambulatory Visit

## 2024-05-14 ENCOUNTER — Encounter (HOSPITAL_COMMUNITY): Payer: Self-pay

## 2024-05-15 ENCOUNTER — Ambulatory Visit

## 2024-05-22 ENCOUNTER — Ambulatory Visit

## 2024-06-12 ENCOUNTER — Ambulatory Visit

## 2024-06-13 ENCOUNTER — Ambulatory Visit: Admitting: Nurse Practitioner

## 2024-06-24 ENCOUNTER — Ambulatory Visit
